# Patient Record
Sex: Female | Born: 1945
Health system: Southern US, Community
[De-identification: ages and names within clinical notes are randomized; demographics above are authoritative.]

## PROBLEM LIST (undated history)

## (undated) DIAGNOSIS — K219 Gastro-esophageal reflux disease without esophagitis: Secondary | ICD-10-CM

## (undated) DIAGNOSIS — E7800 Pure hypercholesterolemia, unspecified: Secondary | ICD-10-CM

## (undated) DIAGNOSIS — E119 Type 2 diabetes mellitus without complications: Secondary | ICD-10-CM

## (undated) DIAGNOSIS — F419 Anxiety disorder, unspecified: Secondary | ICD-10-CM

## (undated) DIAGNOSIS — R1310 Dysphagia, unspecified: Secondary | ICD-10-CM

## (undated) DIAGNOSIS — K449 Diaphragmatic hernia without obstruction or gangrene: Secondary | ICD-10-CM

## (undated) DIAGNOSIS — R202 Paresthesia of skin: Secondary | ICD-10-CM

## (undated) DIAGNOSIS — J45909 Unspecified asthma, uncomplicated: Secondary | ICD-10-CM

## (undated) DIAGNOSIS — I10 Essential (primary) hypertension: Secondary | ICD-10-CM

## (undated) DIAGNOSIS — Z8489 Family history of other specified conditions: Secondary | ICD-10-CM

## (undated) DIAGNOSIS — J309 Allergic rhinitis, unspecified: Secondary | ICD-10-CM

## (undated) HISTORY — DX: Essential (primary) hypertension: I10

## (undated) HISTORY — DX: Allergic rhinitis, unspecified: J30.9

## (undated) HISTORY — PX: LASIK: SHX215

## (undated) HISTORY — DX: Paresthesia of skin: R20.2

## (undated) HISTORY — DX: Type 2 diabetes mellitus without complications: E11.9

## (undated) HISTORY — DX: Pure hypercholesterolemia, unspecified: E78.00

## (undated) HISTORY — DX: Gastro-esophageal reflux disease without esophagitis: K21.9

## (undated) HISTORY — DX: Unspecified asthma, uncomplicated: J45.909

## (undated) HISTORY — PX: LIPOMA RESECTION: SHX23

## (undated) HISTORY — DX: Diaphragmatic hernia without obstruction or gangrene: K44.9

## (undated) HISTORY — DX: Dysphagia, unspecified: R13.10

## (undated) MED FILL — Iron Sucrose Inj 20 MG/ML (Fe Equiv): INTRAVENOUS | Qty: 10 | Status: AC

---

## 2005-10-07 ENCOUNTER — Emergency Department (HOSPITAL_COMMUNITY): Admission: EM | Admit: 2005-10-07 | Discharge: 2005-10-07 | Payer: Self-pay | Admitting: Emergency Medicine

## 2006-06-20 HISTORY — PX: UPPER GASTROINTESTINAL ENDOSCOPY: SHX188

## 2006-09-04 ENCOUNTER — Encounter: Admission: RE | Admit: 2006-09-04 | Discharge: 2006-09-04 | Payer: Self-pay | Admitting: Otolaryngology

## 2006-09-26 ENCOUNTER — Ambulatory Visit: Payer: Self-pay | Admitting: Internal Medicine

## 2006-09-28 ENCOUNTER — Ambulatory Visit: Payer: Self-pay | Admitting: Internal Medicine

## 2006-11-29 ENCOUNTER — Ambulatory Visit: Payer: Self-pay | Admitting: Internal Medicine

## 2007-11-03 DIAGNOSIS — I10 Essential (primary) hypertension: Secondary | ICD-10-CM | POA: Insufficient documentation

## 2007-11-03 DIAGNOSIS — K219 Gastro-esophageal reflux disease without esophagitis: Secondary | ICD-10-CM | POA: Insufficient documentation

## 2007-11-03 DIAGNOSIS — J309 Allergic rhinitis, unspecified: Secondary | ICD-10-CM | POA: Insufficient documentation

## 2007-11-03 DIAGNOSIS — E119 Type 2 diabetes mellitus without complications: Secondary | ICD-10-CM | POA: Insufficient documentation

## 2008-04-01 ENCOUNTER — Telehealth: Payer: Self-pay | Admitting: Internal Medicine

## 2010-11-02 NOTE — Assessment & Plan Note (Signed)
[facility]                         GASTROENTEROLOGY OFFICE NOTE   NAME:Holly Harper, Holly Harper                       MRN:          [id number]  DATE:11/29/2006                            DOB:          March 23, 1946    CHIEF COMPLAINT:  Followup of cough, atypical reflux.   She is significantly improved with much less cough on Protonix (generic)  40 mg b.i.d.  She feels well overall.  I have reviewed her medications,  reviewed the endoscopy findings that showed minimal erythema in the  distal esophagus.  She has had some prior authorization issues that may  have been straightened out with her Protonix.   PROBLEMS/PAST MEDICAL HISTORY:  See list from September 26, 2006 plus chronic  cough thought to be secondary to GERD.   PHYSICAL EXAMINATION:  VITAL SIGNS:  Height 5 feet 3.  Weight 155  pounds.  Pulse 68.  Blood pressure 100/68.   ASSESSMENT:  Atypical reflux with proximal reflux problems causing cough  as the working diagnosis.  She has responded to b.i.d. proton pump  inhibitor.   PLAN:  Continue b.i.d. PPI.  I want her to have good control of her  cough for about two months, and then she can try to go to once a day.  I  have refilled her Protonix (generic), and we will try to get the prior  authorization that is needed to continue this prescription.  She will  see me back as needed otherwise.  She will check into exactly when her  last colonoscopy was, as I have told her she should have one every 10  years, as she is average risk and apparently had no polyps the last  time.     Iva Boop, MD,FACG  Electronically Signed    CEG/MedQ  DD: 11/29/2006  DT: 11/30/2006  Job #: [id number]   cc:   Hermelinda Medicus, M.D.  Laureen Abrahams, M.D.

## 2010-11-05 NOTE — Assessment & Plan Note (Signed)
[facility]                         GASTROENTEROLOGY OFFICE NOTE   NAME:Holly Harper, Holly Harper                       MRN:          [id number]  DATE:09/28/2006                            DOB:          [birth year]    I spoke to Mr. Nordmann, regarding his wife. He asked me to call him  during the routine followup call from the [facility]. He  wanted to let me know that he thinks that his wife goes for a day or two  without coughing and that when she does do it she is really clearing her  throat. She has a habit of not blowing her nose and snorting congestion  instead. He thinks this may be the problem and not really a cough. He  thinks it may be a nervous habit that she has developed. I told him I  would keep that in consideration when she comes in for followup.     Iva Boop, MD,FACG  Electronically Signed    CEG/MedQ  DD: 09/29/2006  DT: 09/29/2006  Job #: [id number]

## 2010-11-05 NOTE — Assessment & Plan Note (Signed)
HEALTHCARE                         GASTROENTEROLOGY OFFICE NOTE   NAME:Mapp, Mccall                       MRN:          [id number]  DATE:09/26/2006                            DOB:          [birth year]    CHIEF COMPLAINT:  Cough.   REQUESTING PHYSICIAN:  Dr. Hermelinda Medicus.   ASSESSMENT:  A 65 year old white woman with cough for a year.  It is a  nonproductive cough.  She has seen a pulmonary physician, as well as Dr.  Haroldine Laws and her primary care physician.  It sounds like a trial of  single-dose Nexium was ineffective, though there were side effects.  She  does not appear to have any evidence of laryngeal damage from Dr.  Allayne Stack information sent.  She does not have dysphagia.  She had not  responded to trials of prednisone in the past, and apparently does not  have asthma or COPD per her history.   Chronic reflux is a possibility.  She had a barium swallow which Dr.  Haroldine Laws tells me showed reflux, tertiary contractions, and a hiatal  hernia.   PLAN:  1. Upper GI endoscopy to look for mucosal damage, check the anatomy,      measure the Z-line.  2. Consider a trial of a different PPI b.i.d. for 2 to 3 months.  She      had gas symptoms with the Nexium.  3. Review records from Dr. Egbert Garibaldi and Dr. Blenda Nicely.  These will be      requested.   HISTORY:  This is a 65 year old white woman with problems as outlined  above.  She feels like phlegm needs to come up, but she cannot get it  up.  There may be some sinus drainage at times.  She has been to Dr.  Haroldine Laws, her primary care physician, and a pulmonary physician, Dr.  Blenda Nicely.  No conclusive diagnosis has been given, and she has the  persistent symptoms.  She took Nexium for an unspecified period of time  on single dose, but could not tolerate the gas and is now on Zantac.  There are symptoms of heartburn and reflux, and the barium swallow  findings are as noted.  There does not seem to be  a predominance of post-  nasal drip or sinus drainage that I can see.  She eliminated caffeine,  but still has problems.  The cough can occur at any times.  It had  frequently occurred after eating, but in general, had occurred at other  times as well.   GI review of systems is otherwise negative, or as stated in the  assessment.   MEDICATIONS:  Listed and reviewed in the chart.  See that for doses.  She is on atenolol, glipizide, hydrochlorothiazide, Lipitor, [facility], Kalcinate plus, Echinacea, Co-Q-10, Vi-Q-Tuss cough medicine  p.r.n.   MACRODANTIN is an allergy.   Other p.r.n. medications are Astelin nasal spray.   PAST MEDICAL HISTORY:  1. Hypertension.  2. Mild allergies.  3. Diabetes mellitus.  4. Eye surgery.  5. Colonoscopy negative 5 years or so ago [facility]).   FAMILY  HISTORY:  Positive for prostate cancer and heart disease in her  father.  No colon cancer.   SOCIAL HISTORY:  The patient is married.  She is an Database administrator.  No alcohol, tobacco, or drugs used.   REVIEW OF SYSTEMS:  See my medical history form for full details.  There  is some dyspnea, but she says she relates that mainly to lack of  activity and deconditioning.   PHYSICAL EXAM:  Reveals a well-developed, well-nourished middle-aged  woman.  Height 5 feet 3 inches, weight 153 pounds, blood pressure 110/70, pulse  62.  She is overweight.  EYES:  Anicteric.  HENT:  Normal mouth and posterior pharynx.  NECK:  Supple.  No thyromegaly or mass.  CHEST:  Clear.  HEART:  S1, S2.  No rubs or gallops.  ABDOMEN:  Soft and nontender without organomegaly or mass.  LYMPHATICS:  No neck or supraclavicular nodes.  EXTREMITIES:  No peripheral edema.  SKIN:  Warm and dry.  No acute rash in areas inspected.  NEURO:  She is alert and oriented x3.   I have reviewed Dr. Allayne Stack office note and his referral letter.   Note, a modified barium swallow could be indicated to check for silent   aspiration problems.  I think we will proceed with the workup as  described first.  I have also reviewed the barium swallow report, as  well as the 2-view chest x-ray report ordered by Dr. Haroldine Laws and  performed at [facility].  I appreciate the opportunity to care  for this patient.     Iva Boop, MD,FACG  Electronically Signed    CEG/MedQ  DD: 09/26/2006  DT: 09/26/2006  Job #: [id number]   cc:   Hermelinda Medicus, M.D.  John Slatosky  Tanvir Delynn Flavin, MD

## 2011-08-24 HISTORY — PX: ESOPHAGOGASTRODUODENOSCOPY: SHX1529

## 2012-12-13 ENCOUNTER — Encounter: Payer: Self-pay | Admitting: Internal Medicine

## 2013-07-17 ENCOUNTER — Encounter: Payer: Self-pay | Admitting: Internal Medicine

## 2015-04-24 ENCOUNTER — Other Ambulatory Visit: Payer: Self-pay | Admitting: *Deleted

## 2015-04-24 MED ORDER — OMEPRAZOLE 40 MG PO CPDR: 40.0000 mg | DELAYED_RELEASE_CAPSULE | Freq: Every day | ORAL | Status: DC

## 2015-06-28 ENCOUNTER — Other Ambulatory Visit: Payer: Self-pay | Admitting: Allergy and Immunology

## 2015-07-09 ENCOUNTER — Encounter: Payer: Self-pay | Admitting: Allergy and Immunology

## 2015-07-09 ENCOUNTER — Ambulatory Visit (INDEPENDENT_AMBULATORY_CARE_PROVIDER_SITE_OTHER): Payer: Medicare Other | Admitting: Allergy and Immunology

## 2015-07-09 VITALS — BP 110/60 | HR 66 | Resp 14 | Ht 63.27 in | Wt 130.1 lb

## 2015-07-09 DIAGNOSIS — J4520 Mild intermittent asthma, uncomplicated: Secondary | ICD-10-CM

## 2015-07-09 DIAGNOSIS — K219 Gastro-esophageal reflux disease without esophagitis: Secondary | ICD-10-CM

## 2015-07-09 DIAGNOSIS — J309 Allergic rhinitis, unspecified: Secondary | ICD-10-CM | POA: Diagnosis not present

## 2015-07-09 DIAGNOSIS — J387 Other diseases of larynx: Secondary | ICD-10-CM | POA: Diagnosis not present

## 2015-07-09 DIAGNOSIS — H1010 Acute atopic conjunctivitis, unspecified eye: Secondary | ICD-10-CM | POA: Diagnosis not present

## 2015-07-09 NOTE — Progress Notes (Signed)
[facility]  Follow-up Note  Referring Provider: No ref. provider found Primary Provider: Enid Skeens., MD Date of Office Visit: 07/09/2015  Subjective:   Holly Harper is a 70 y.o. female who returns to the [facility] in re-evaluation of the following:  HPI Comments: Arlette returns to this clinic on 19 generally 2017 in evaluation of her asthma, allergic rhinoconjunctivitis, and LPR. She's done quite well over the course the past year. Her nose is not been causing her any problem. Her chest is not been causing her any problem. She rarely uses her bronchodilator less than 1 time per week and has not had an exacerbation of her asthma requiring her to get a systemic steroid. Her reflux is been under excellent control. She did receive the flu vaccine this year and the zoster vaccine vaccine.   Current Outpatient Prescriptions on File Prior to Visit  Medication Sig Dispense Refill  . montelukast (SINGULAIR) 10 MG tablet TAKE 1 TABLET BY MOUTH EVERY DAY AS DIRECTED 30 tablet 0  . omeprazole (PRILOSEC) 40 MG capsule Take 1 capsule (40 mg total) by mouth daily. 30 capsule 1   No current facility-administered medications on file prior to visit.    No orders of the defined types were placed in this encounter.    Past Medical History  Diagnosis Date  . GERD (gastroesophageal reflux disease)   . Hiatal hernia   . Asthma   . Allergic rhinitis   . Diabetes ([place])   . Hypertension   . Hypercholesterolemia     Past Surgical History  Procedure Laterality Date  . Lasik Bilateral     Allergies  Allergen Reactions  . Amoxicillin Hives  . Cinobac [Cinoxacin]   . Macrodantin [Nitrofurantoin Macrocrystal] Hives    Review of systems negative except as noted in HPI / PMHx or noted below:  Review of Systems  Constitutional: Negative.   HENT: Negative.   Eyes: Negative.   Respiratory: Negative.   Cardiovascular:  Negative.   Gastrointestinal: Negative.   Genitourinary: Negative.   Musculoskeletal: Negative.   Skin: Negative.   Neurological: Negative.   Endo/Heme/Allergies: Negative.   Psychiatric/Behavioral: Negative.      Objective:   Filed Vitals:   07/09/15 1139  BP: 110/60  Pulse: 66  Resp: 14   Height: 5' 3.27" (160.7 cm)  Weight: 130 lb 1.1 oz (59 kg)   Physical Exam  Constitutional: She is well-developed, well-nourished, and in no distress. No distress.  HENT:  Head: Normocephalic.  Right Ear: Tympanic membrane, external ear and ear canal normal.  Left Ear: Tympanic membrane, external ear and ear canal normal.  Nose: Nose normal. No mucosal edema or rhinorrhea.  Mouth/Throat: Uvula is midline, oropharynx is clear and moist and mucous membranes are normal. No oropharyngeal exudate.  Eyes: Conjunctivae are normal.  Neck: Trachea normal. No tracheal tenderness present. No tracheal deviation present. No thyromegaly present.  Cardiovascular: Normal rate, regular rhythm, S1 normal, S2 normal and normal heart sounds.   No murmur heard. Pulmonary/Chest: Breath sounds normal. No stridor. No respiratory distress. She has no wheezes. She has no rales.  Musculoskeletal: She exhibits no edema.  Lymphadenopathy:       Head (right side): No tonsillar adenopathy present.       Head (left side): No tonsillar adenopathy present.    She has no cervical adenopathy.    She has no axillary adenopathy.  Neurological: She is alert. Gait normal.  Skin: No rash noted. She is not diaphoretic. No erythema. Nails show no clubbing.  Psychiatric: Mood and affect normal.    Diagnostics:    Spirometry was performed and demonstrated an FEV1 of 1.95 at 89 % of predicted.  The patient had an Asthma Control Test with the following results:  .    Assessment and Plan:   1. Asthma, mild intermittent, well-controlled   2. LPRD (laryngopharyngeal reflux disease)   3. Allergic rhinoconjunctivitis      1. Continue montelukast 10 mg daily  2. Continue omeprazole 40 mg daily  3. Continue nasal azelastine 2 sprays each nostril twice a day if needed  4. Continue ProAir HFA 2 puffs every 4-6 hours if needed  5. Return to clinic in 1 year or earlier if problem  Lynnsey will return to this clinic in approximately one year while continuing to use therapy directed against inflammation of her respiratory tract and reflux. Certainly if she has problems during the interval she will return to see me for further evaluation and treatment but overall she has had an excellent response to menstruation montelukast and omeprazole as her medications designed to manage her asthma and allergic rhinoconjunctivitis and reflux.  [name], MD [place]

## 2015-07-09 NOTE — Patient Instructions (Signed)
  1. Continue montelukast 10 mg daily  2. Continue omeprazole 40 mg daily  3. Continue nasal azelastine 2 sprays each nostril twice a day if needed  4. Continue ProAir HFA 2 puffs every 4-6 hours if needed  5. Return to clinic in 1 year or earlier if problem

## 2015-07-18 ENCOUNTER — Other Ambulatory Visit: Payer: Self-pay | Admitting: Allergy and Immunology

## 2015-07-29 ENCOUNTER — Other Ambulatory Visit: Payer: Self-pay | Admitting: Allergy and Immunology

## 2015-08-15 ENCOUNTER — Other Ambulatory Visit: Payer: Self-pay | Admitting: Allergy and Immunology

## 2015-11-24 ENCOUNTER — Other Ambulatory Visit: Payer: Self-pay | Admitting: Allergy and Immunology

## 2016-01-18 ENCOUNTER — Other Ambulatory Visit: Payer: Self-pay | Admitting: Allergy and Immunology

## 2016-02-10 ENCOUNTER — Other Ambulatory Visit: Payer: Self-pay

## 2016-02-10 ENCOUNTER — Telehealth: Payer: Self-pay | Admitting: Allergy and Immunology

## 2016-02-10 HISTORY — PX: COLONOSCOPY: SHX174

## 2016-02-10 MED ORDER — MOMETASONE FUROATE 220 MCG/INH IN AEPB: 2.0000 | INHALATION_SPRAY | Freq: Two times a day (BID) | RESPIRATORY_TRACT | 0 refills | Status: DC

## 2016-02-10 NOTE — Telephone Encounter (Signed)
Had a colonoscopy today and her oxygen is low. She wants to add asmanex to her plan. She said she was on it before.    [facility]

## 2016-02-10 NOTE — Telephone Encounter (Signed)
Prescribed Asmanex 220 - 2 inhalations twice a day. Please have patient come to see me in clinic

## 2016-02-10 NOTE — Telephone Encounter (Signed)
Pt informed. Asmanex sent to pharmacy and pt will schedule appt

## 2016-02-17 ENCOUNTER — Ambulatory Visit (INDEPENDENT_AMBULATORY_CARE_PROVIDER_SITE_OTHER): Payer: Medicare Other | Admitting: Allergy and Immunology

## 2016-02-17 ENCOUNTER — Encounter: Payer: Self-pay | Admitting: Allergy and Immunology

## 2016-02-17 VITALS — BP 140/74 | HR 72 | Resp 20

## 2016-02-17 DIAGNOSIS — J387 Other diseases of larynx: Secondary | ICD-10-CM

## 2016-02-17 DIAGNOSIS — J4531 Mild persistent asthma with (acute) exacerbation: Secondary | ICD-10-CM | POA: Diagnosis not present

## 2016-02-17 DIAGNOSIS — J309 Allergic rhinitis, unspecified: Secondary | ICD-10-CM | POA: Diagnosis not present

## 2016-02-17 DIAGNOSIS — H1010 Acute atopic conjunctivitis, unspecified eye: Secondary | ICD-10-CM | POA: Diagnosis not present

## 2016-02-17 DIAGNOSIS — K219 Gastro-esophageal reflux disease without esophagitis: Secondary | ICD-10-CM

## 2016-02-17 MED ORDER — RANITIDINE HCL 300 MG PO TABS: ORAL_TABLET | ORAL | 5 refills | Status: DC

## 2016-02-17 MED ORDER — METHYLPREDNISOLONE ACETATE 80 MG/ML IJ SUSP
80.0000 mg | Freq: Once | INTRAMUSCULAR | Status: AC
  Administered 2016-02-17: 80 mg via INTRAMUSCULAR

## 2016-02-17 NOTE — Progress Notes (Signed)
Follow-up Note  Referring Provider: Enid Skeens., MD Primary Provider: Enid Skeens., MD Date of Office Visit: 02/17/2016  Subjective:   Holly Harper (DOB: Jul 01, 1945) is a 70 y.o. female who returns to the [facility] on 02/17/2016 in re-evaluation of the following:  HPI: Ave presents to this clinic in evaluation of her asthma and allergic rhinoconjunctivitis and LPR. I've not seen her in his clinic since January 2017.  She was doing quite well until about 2 weeks ago. At that point time she developed some cough and some shortness of breath and raspy voice and she's been using her short-acting bronchodilator which does help her transiently. Apparently last week when she was having a colonoscopy she tells me that her oxygen level may have been low. In addition she's had some stuffy nose and she restarted her Astelin. She contacted [place] by telephone about 5 days ago and asked to restart her Asmanex.    Medication List      atenolol 100 MG tablet Commonly known as:  TENORMIN Take 50 mg by mouth daily.   atorvastatin 10 MG tablet Commonly known as:  LIPITOR TAKE 1 TABLET ONCE A DAY (AT BEDTIME)   azelastine 0.1 % nasal spray Commonly known as:  ASTELIN Place 2 sprays into both nostrils 2 (two) times daily.   FLUZONE HIGH-DOSE 0.5 ML Susy Generic drug:  Influenza Vac Split High-Dose TO BE ADMINISTERED BY A PHARMACIST   glipiZIDE 10 MG 24 hr tablet Commonly known as:  GLUCOTROL XL Take 10 mg by mouth daily.   hydrochlorothiazide 25 MG tablet Commonly known as:  HYDRODIURIL Take 25 mg by mouth daily.   JANUVIA 100 MG tablet Generic drug:  sitaGLIPtin   metFORMIN 500 MG tablet Commonly known as:  GLUCOPHAGE 1,000 mg 2 (two) times daily.   mometasone 220 MCG/INH inhaler Commonly known as:  ASMANEX 60 METERED DOSES Inhale 2 puffs into the lungs 2 (two) times daily.   montelukast 10 MG tablet Commonly known as:  SINGULAIR TAKE 1 TABLET BY  MOUTH EVERY DAY AS DIRECTED   MUCINEX DM PO Take by mouth as needed.   omeprazole 40 MG capsule Commonly known as:  PRILOSEC TAKE 1 CAPSULE (40 MG TOTAL) BY MOUTH DAILY.   ONE TOUCH ULTRA TEST test strip Generic drug:  glucose blood USE AS DIRECTED TEST DAILY   PROAIR HFA 108 (90 Base) MCG/ACT inhaler Generic drug:  albuterol INHALE 2 PUFFS EVERY 4-6 HOURS AS NEEDED FOR COUGH OR WHEEZE   VITAMIN D PO Take by mouth.       Past Medical History:  Diagnosis Date  . Allergic rhinitis   . Asthma   . Diabetes ([place])   . GERD (gastroesophageal reflux disease)   . Hiatal hernia   . Hypercholesterolemia   . Hypertension     Past Surgical History:  Procedure Laterality Date  . LASIK Bilateral     Allergies  Allergen Reactions  . Amoxicillin Hives  . Cinobac [Cinoxacin]   . Macrodantin [Nitrofurantoin Macrocrystal] Hives    Review of systems negative except as noted in HPI / PMHx or noted below:  Review of Systems  Constitutional: Negative.   HENT: Negative.   Eyes: Negative.   Respiratory: Negative.   Cardiovascular: Negative.   Gastrointestinal: Negative.   Genitourinary: Negative.   Musculoskeletal: Negative.   Skin: Negative.   Neurological: Negative.   Endo/Heme/Allergies: Negative.   Psychiatric/Behavioral: Negative.      Objective:   Vitals:  02/17/16 1450  BP: 140/74  Pulse: 72  Resp: 20          Physical Exam  Constitutional: She is well-developed, well-nourished, and in no distress.  Raspy voice  HENT:  Head: Normocephalic.  Right Ear: Tympanic membrane, external ear and ear canal normal.  Left Ear: Tympanic membrane, external ear and ear canal normal.  Nose: Nose normal. No mucosal edema or rhinorrhea.  Mouth/Throat: Uvula is midline, oropharynx is clear and moist and mucous membranes are normal. No oropharyngeal exudate.  Eyes: Conjunctivae are normal.  Neck: Trachea normal. No tracheal tenderness present. No tracheal deviation  present. No thyromegaly present.  Cardiovascular: Normal rate, regular rhythm, S1 normal, S2 normal and normal heart sounds.   No murmur heard. Pulmonary/Chest: Breath sounds normal. No stridor. No respiratory distress. She has no wheezes. She has no rales.  Musculoskeletal: She exhibits no edema.  Lymphadenopathy:       Head (right side): No tonsillar adenopathy present.       Head (left side): No tonsillar adenopathy present.    She has no cervical adenopathy.  Neurological: She is alert. Gait normal.  Skin: No rash noted. She is not diaphoretic. No erythema. Nails show no clubbing.  Psychiatric: Mood and affect normal.    Diagnostics:    Spirometry was performed and demonstrated an FEV1 of 1.78 at 82 % of predicted.  Oxygen saturation was 96% on room air at rest   Assessment and Plan:   1. Asthma, not well controlled, mild persistent, with acute exacerbation   2. LPRD (laryngopharyngeal reflux disease)   3. Allergic rhinoconjunctivitis     1. Continue montelukast 10 mg daily  2. Continue Asmanex 220 - 2 inhalations twice a day  3. Continue omeprazole 40 mg daily and add ranitidine 300 mg in evening  4. Depo-Medrol 80 IM delivered in clinic today  5. Continue nasal azelastine 2 sprays each nostril twice a day if needed  6. Continue ProAir HFA 2 puffs every 4-6 hours if needed  7. Return to clinic in 8 weeks or earlier if problem  8. Obtain fall flu vaccine  Kal[place]ia will use a combination of anti-inflammatory medications for her respiratory tract including a systemic steroid and we'll get her to be a little bit more aggressive about treating her reflux-induced respiratory disease with the addition of an H2 receptor blocker over the course of the next 8 weeks or so. She'll contact me should she have significant problems in the face of that therapy but otherwise I'll regroup with her in 8 weeks and we will make a decision about how to proceed pending her response.  [name], MD [place]

## 2016-02-17 NOTE — Patient Instructions (Addendum)
  1. Continue montelukast 10 mg daily  2. Continue Asmanex 220 - 2 inhalations twice a day  3. Continue omeprazole 40 mg daily and add ranitidine 300 mg in evening  4. Depo-Medrol 80 IM delivered in clinic today  5. Continue nasal azelastine 2 sprays each nostril twice a day if needed  6. Continue ProAir HFA 2 puffs every 4-6 hours if needed  7. Return to clinic in 8 weeks or earlier if problem  8. Obtain fall flu vaccine

## 2016-03-04 ENCOUNTER — Other Ambulatory Visit: Payer: Self-pay | Admitting: Allergy and Immunology

## 2016-04-13 ENCOUNTER — Ambulatory Visit (INDEPENDENT_AMBULATORY_CARE_PROVIDER_SITE_OTHER): Payer: Medicare Other | Admitting: Allergy and Immunology

## 2016-04-13 ENCOUNTER — Encounter: Payer: Self-pay | Admitting: Allergy and Immunology

## 2016-04-13 VITALS — BP 132/78 | HR 72 | Resp 16

## 2016-04-13 DIAGNOSIS — K219 Gastro-esophageal reflux disease without esophagitis: Secondary | ICD-10-CM

## 2016-04-13 DIAGNOSIS — J4520 Mild intermittent asthma, uncomplicated: Secondary | ICD-10-CM

## 2016-04-13 DIAGNOSIS — J3089 Other allergic rhinitis: Secondary | ICD-10-CM | POA: Diagnosis not present

## 2016-04-13 NOTE — Patient Instructions (Addendum)
  1. Continue montelukast 10 mg daily  2. Continue "action plan" for asthma flare including Asmanex 220 - 2 inhalations twice a day  3. Continue omeprazole 40 mg daily and add ranitidine 300 mg in evening if needed  4. Continue nasal azelastine 2 sprays each nostril twice a day if needed  5. Continue ProAir HFA 2 puffs every 4-6 hours if needed  6. Return to clinic in 6 months or earlier if problem

## 2016-04-13 NOTE — Progress Notes (Signed)
Follow-up Note  Referring Provider: Enid Harper., MD Primary Provider: Enid Harper., MD Date of Office Visit: 04/13/2016  Subjective:   Holly Harper (DOB: 1945/10/23) is a 70 y.o. female who returns to the [facility] on 04/13/2016 in re-evaluation of the following:  HPI: Holly Harper returns to this clinic in reevaluation of her intermittent asthma and allergic rhinoconjunctivitis and LPR. I last saw her in this clinic in August 2017 at which time she appeared to have a respiratory tract flare that required the administration of systemic steroids.  She has really done quite well since that event and once again tapered off her Asmanex. She only uses her Asmanex as part of an action plan. She does not use any short acting bronchodilator and she can exert herself without any problem.  Her nose has not been causing her any issue. She has not required an antibiotic to treat an episode of sinusitis.  Her reflux is under very good control and she no longer uses ranitidine at night time but does rely on the use of omeprazole every day.  She has received her flu vaccine this year.    Medication List      ASMANEX 60 METERED DOSES 220 MCG/INH inhaler Generic drug:  mometasone INHALE 2 PUFFS INTO THE LUNGS 2 (TWO) TIMES DAILY.   atenolol 100 MG tablet Commonly known as:  TENORMIN Take 50 mg by mouth daily.   atorvastatin 10 MG tablet Commonly known as:  LIPITOR TAKE 1 TABLET ONCE A DAY (AT BEDTIME)   azelastine 0.1 % nasal spray Commonly known as:  ASTELIN Place 2 sprays into both nostrils 2 (two) times daily.   glipiZIDE 10 MG 24 hr tablet Commonly known as:  GLUCOTROL XL Take 10 mg by mouth daily.   hydrochlorothiazide 25 MG tablet Commonly known as:  HYDRODIURIL Take 25 mg by mouth daily.   JANUVIA 100 MG tablet Generic drug:  sitaGLIPtin   metFORMIN 500 MG tablet Commonly known as:  GLUCOPHAGE 1,000 mg 2 (two) times daily.   montelukast 10 MG  tablet Commonly known as:  SINGULAIR TAKE 1 TABLET BY MOUTH EVERY DAY AS DIRECTED   MUCINEX DM PO Take by mouth as needed.   omeprazole 40 MG capsule Commonly known as:  PRILOSEC TAKE 1 CAPSULE (40 MG TOTAL) BY MOUTH DAILY.   ONE TOUCH ULTRA TEST test strip Generic drug:  glucose blood USE AS DIRECTED TEST DAILY   PROAIR HFA 108 (90 Base) MCG/ACT inhaler Generic drug:  albuterol INHALE 2 PUFFS EVERY 4-6 HOURS AS NEEDED FOR COUGH OR WHEEZE   ranitidine 300 MG tablet Commonly known as:  ZANTAC Take one tablet every evening       Past Medical History:  Diagnosis Date  . Allergic rhinitis   . Asthma   . Diabetes ([place])   . GERD (gastroesophageal reflux disease)   . Hiatal hernia   . Hypercholesterolemia   . Hypertension     Past Surgical History:  Procedure Laterality Date  . LASIK Bilateral     Allergies  Allergen Reactions  . Amoxicillin Hives  . Cinobac [Cinoxacin]   . Macrodantin [Nitrofurantoin Macrocrystal] Hives    Review of systems negative except as noted in HPI / PMHx or noted below:  Review of Systems  Constitutional: Negative.   HENT: Negative.   Eyes: Negative.   Respiratory: Negative.   Cardiovascular: Negative.   Gastrointestinal: Negative.   Genitourinary: Negative.   Musculoskeletal: Negative.   Skin: Negative.  Neurological: Negative.   Endo/Heme/Allergies: Negative.   Psychiatric/Behavioral: Negative.      Objective:   Vitals:   04/13/16 1436  BP: 132/78  Pulse: 72  Resp: 16          Physical Exam  Constitutional: She is well-developed, well-nourished, and in no distress.  HENT:  Head: Normocephalic.  Right Ear: Tympanic membrane, external ear and ear canal normal.  Left Ear: Tympanic membrane, external ear and ear canal normal.  Nose: Nose normal. No mucosal edema or rhinorrhea.  Mouth/Throat: Uvula is midline, oropharynx is clear and moist and mucous membranes are normal. No oropharyngeal exudate.  Eyes:  Conjunctivae are normal.  Neck: Trachea normal. No tracheal tenderness present. No tracheal deviation present. No thyromegaly present.  Cardiovascular: Normal rate, regular rhythm, S1 normal, S2 normal and normal heart sounds.   No murmur heard. Pulmonary/Chest: Breath sounds normal. No stridor. No respiratory distress. She has no wheezes. She has no rales.  Musculoskeletal: She exhibits no edema.  Lymphadenopathy:       Head (right side): No tonsillar adenopathy present.       Head (left side): No tonsillar adenopathy present.    She has no cervical adenopathy.  Neurological: She is alert. Gait normal.  Skin: No rash noted. She is not diaphoretic. No erythema. Nails show no clubbing.  Psychiatric: Mood and affect normal.    Diagnostics:    Spirometry was performed and demonstrated an FEV1 of 1.83 at 84 % of predicted.  The patient had an Asthma Control Test with the following results: ACT Total Score: 24.    Assessment and Plan:   1. Asthma, mild intermittent, well-controlled   2. Other allergic rhinitis   3. LPRD (laryngopharyngeal reflux disease)     1. Continue montelukast 10 mg daily  2. Continue "action plan" for asthma flare including Asmanex 220 - 2 inhalations twice a day  3. Continue omeprazole 40 mg daily and add ranitidine 300 mg in evening if needed  4. Continue nasal azelastine 2 sprays each nostril twice a day if needed  5. Continue ProAir HFA 2 puffs every 4-6 hours if needed  6. Return to clinic in 6 months or earlier if problem  Holly Harper is really doing very well on her current plan and I see no need for changing her anti-inflammatory medications for her respiratory tract or her proton pump inhibitor for her reflux at this point in time. Certainly she can activate her action plan should she develop an asthma flare in the future. I will see her back in this clinic in 6 months or earlier if there is a problem.  Holly Katz, MD [facility]

## 2016-06-21 DIAGNOSIS — E119 Type 2 diabetes mellitus without complications: Secondary | ICD-10-CM | POA: Insufficient documentation

## 2016-06-25 ENCOUNTER — Other Ambulatory Visit: Payer: Self-pay | Admitting: Allergy and Immunology

## 2016-07-11 ENCOUNTER — Ambulatory Visit: Payer: Medicare Other | Admitting: Allergy and Immunology

## 2016-08-04 ENCOUNTER — Other Ambulatory Visit: Payer: Self-pay | Admitting: Allergy and Immunology

## 2016-10-06 ENCOUNTER — Other Ambulatory Visit: Payer: Self-pay | Admitting: *Deleted

## 2016-10-06 MED ORDER — OMEPRAZOLE 40 MG PO CPDR: DELAYED_RELEASE_CAPSULE | ORAL | 0 refills | Status: DC

## 2016-10-13 ENCOUNTER — Encounter: Payer: Self-pay | Admitting: Allergy and Immunology

## 2016-10-13 ENCOUNTER — Ambulatory Visit (INDEPENDENT_AMBULATORY_CARE_PROVIDER_SITE_OTHER): Payer: Medicare Other | Admitting: Allergy and Immunology

## 2016-10-13 VITALS — BP 120/66 | HR 72 | Resp 20

## 2016-10-13 DIAGNOSIS — J0100 Acute maxillary sinusitis, unspecified: Secondary | ICD-10-CM | POA: Diagnosis not present

## 2016-10-13 DIAGNOSIS — J3089 Other allergic rhinitis: Secondary | ICD-10-CM | POA: Diagnosis not present

## 2016-10-13 DIAGNOSIS — J4531 Mild persistent asthma with (acute) exacerbation: Secondary | ICD-10-CM | POA: Diagnosis not present

## 2016-10-13 DIAGNOSIS — K219 Gastro-esophageal reflux disease without esophagitis: Secondary | ICD-10-CM

## 2016-10-13 MED ORDER — CEFUROXIME AXETIL 250 MG PO TABS: 250.0000 mg | ORAL_TABLET | Freq: Two times a day (BID) | ORAL | 0 refills | Status: AC

## 2016-10-13 MED ORDER — RANITIDINE HCL 300 MG PO TABS: 300.0000 mg | ORAL_TABLET | Freq: Every day | ORAL | 5 refills | Status: DC

## 2016-10-13 MED ORDER — METHYLPREDNISOLONE ACETATE 80 MG/ML IJ SUSP
80.0000 mg | Freq: Once | INTRAMUSCULAR | Status: AC
  Administered 2016-10-13: 80 mg via INTRAMUSCULAR

## 2016-10-13 MED ORDER — FLUTICASONE FUROATE 200 MCG/ACT IN AEPB: 1.0000 | INHALATION_SPRAY | Freq: Every day | RESPIRATORY_TRACT | 3 refills | Status: DC

## 2016-10-13 NOTE — Progress Notes (Signed)
Follow-up Note  Referring Provider: Enid Skeens., MD Primary Provider: Enid Skeens., MD Date of Office Visit: 10/13/2016  Subjective:   Holly Harper (DOB: 1946-02-26) is a 71 y.o. female who returns to the [facility] on 10/13/2016 in re-evaluation of the following:  HPI:  Holly Harper resents to this clinic in evaluation of her asthma and allergic rhinoconjunctivitis and LPR. I last saw her in this clinic October 2017 at which point time she was doing relatively well.  She continued to do well up until about one month ago. At that point time she developed nasal congestion and sinus ache and headache and postnasal drip and throat clearing and had cough. She has also been having some issues with increased regurgitation and reflux. She continues on montelukast and omeprazole on a regular basis and has occasionally added ranitidine and also continues to use nasal antihistamine. Her requirement for short-acting bronchodilator during this timeframe is 3 times per week.  Allergies as of 10/13/2016      Reactions   Amoxicillin Hives   Cinobac [cinoxacin]    Macrodantin [nitrofurantoin Macrocrystal] Hives      Medication List      ASMANEX 60 METERED DOSES 220 MCG/INH inhaler Generic drug:  mometasone INHALE 2 PUFFS INTO THE LUNGS 2 (TWO) TIMES DAILY.   atenolol 100 MG tablet Commonly known as:  TENORMIN Take 50 mg by mouth daily.   atorvastatin 10 MG tablet Commonly known as:  LIPITOR TAKE 1 TABLET ONCE A DAY (AT BEDTIME)   azelastine 0.1 % nasal spray Commonly known as:  ASTELIN Place 2 sprays into both nostrils 2 (two) times daily.   glipiZIDE 10 MG 24 hr tablet Commonly known as:  GLUCOTROL XL Take 10 mg by mouth daily.   hydrochlorothiazide 25 MG tablet Commonly known as:  HYDRODIURIL Take 25 mg by mouth daily.   JANUVIA 100 MG tablet Generic drug:  sitaGLIPtin   metFORMIN 500 MG tablet Commonly known as:  GLUCOPHAGE 1,000 mg 2 (two) times  daily.   montelukast 10 MG tablet Commonly known as:  SINGULAIR TAKE 1 TABLET BY MOUTH EVERY DAY AS DIRECTED   MUCINEX DM PO Take by mouth as needed.   omeprazole 40 MG capsule Commonly known as:  PRILOSEC Take one capsule one daily as directed   ONE TOUCH ULTRA TEST test strip Generic drug:  glucose blood USE AS DIRECTED TEST DAILY   PROAIR HFA 108 (90 Base) MCG/ACT inhaler Generic drug:  albuterol INHALE 2 PUFFS EVERY 4-6 HOURS AS NEEDED FOR COUGH OR WHEEZE   ranitidine 300 MG tablet Commonly known as:  ZANTAC Take one tablet every evening       Past Medical History:  Diagnosis Date  . Allergic rhinitis   . Asthma   . Diabetes ([place])   . GERD (gastroesophageal reflux disease)   . Hiatal hernia   . Hypercholesterolemia   . Hypertension     Past Surgical History:  Procedure Laterality Date  . LASIK Bilateral     Review of systems negative except as noted in HPI / PMHx or noted below:  Review of Systems  Constitutional: Negative.   HENT: Negative.   Eyes: Negative.   Respiratory: Negative.   Cardiovascular: Negative.   Gastrointestinal: Negative.   Genitourinary: Negative.   Musculoskeletal: Negative.   Skin: Negative.   Neurological: Negative.   Endo/Heme/Allergies: Negative.   Psychiatric/Behavioral: Negative.      Objective:   Vitals:   10/13/16 1424  BP:  120/66  Pulse: 72  Resp: 20          Physical Exam  Constitutional: She is well-developed, well-nourished, and in no distress.  Nasal voice  HENT:  Head: Normocephalic.  Right Ear: Tympanic membrane, external ear and ear canal normal.  Left Ear: Tympanic membrane, external ear and ear canal normal.  Nose: Mucosal edema present. No rhinorrhea.  Mouth/Throat: Uvula is midline, oropharynx is clear and moist and mucous membranes are normal. No oropharyngeal exudate.  Eyes: Conjunctivae are normal.  Neck: Trachea normal. No tracheal tenderness present. No tracheal deviation present. No  thyromegaly present.  Cardiovascular: Normal rate, regular rhythm, S1 normal, S2 normal and normal heart sounds.   No murmur heard. Pulmonary/Chest: Breath sounds normal. No stridor. No respiratory distress. She has no wheezes. She has no rales.  Musculoskeletal: She exhibits no edema.  Lymphadenopathy:       Head (right side): No tonsillar adenopathy present.       Head (left side): No tonsillar adenopathy present.    She has no cervical adenopathy.  Neurological: She is alert. Gait normal.  Skin: No rash noted. She is not diaphoretic. No erythema. Nails show no clubbing.  Psychiatric: Mood and affect normal.    Diagnostics:    Spirometry was performed and demonstrated an FEV1 of 1.64 at 76 % of predicted.   Assessment and Plan:   1. Asthma, not well controlled, mild persistent, with acute exacerbation   2. Acute non-recurrent maxillary sinusitis   3. Other allergic rhinitis   4. LPRD (laryngopharyngeal reflux disease)     1. Continue montelukast 10 mg daily  2. Start Arnuity 200 one inhalation 1 time per day   3. Continue omeprazole 40 mg daily and add ranitidine 300 mg in evening    4. Treat infection with Ceftin 250 mg twice a day for the next 10 days plus Depo-Medrol 80 IM delivered in clinic today  5. Continue nasal azelastine 2 sprays each nostril twice a day if needed  6. Continue ProAir HFA 2 puffs every 4-6 hours if needed  7. Return to clinic in 6 months or earlier if problem  I will assume that [name] has an issue with inflammation and infection of her respiratory tract as well as some increased reflux-induced respiratory disease and have her utilize therapy mentioned above which includes increasing anti-inflammatory therapy for her respiratory tract, increasing therapy for her reflux, and using a broad-spectrum antibiotic as noted above. If she does well I will see her back in this clinic in 6 months but she will contact me during the interval should she have a  significant problem.  [name], MD Allergy / [facility]ology [place]

## 2016-10-13 NOTE — Patient Instructions (Addendum)
  1. Continue montelukast 10 mg daily  2. Start Arnuity 200 one inhalation 1 time per day   3. Continue omeprazole 40 mg daily and add ranitidine 300 mg in evening    4. Treat infection with Ceftin 250 mg twice a day for the next 10 days plus Depo-Medrol 80 IM delivered in clinic today  5. Continue nasal azelastine 2 sprays each nostril twice a day if needed  6. Continue ProAir HFA 2 puffs every 4-6 hours if needed  7. Return to clinic in 6 months or earlier if problem

## 2016-11-05 ENCOUNTER — Other Ambulatory Visit: Payer: Self-pay | Admitting: Allergy and Immunology

## 2016-12-23 ENCOUNTER — Other Ambulatory Visit: Payer: Self-pay | Admitting: Allergy and Immunology

## 2017-01-12 ENCOUNTER — Other Ambulatory Visit: Payer: Self-pay | Admitting: Allergy and Immunology

## 2017-02-02 ENCOUNTER — Other Ambulatory Visit: Payer: Self-pay | Admitting: Allergy and Immunology

## 2017-03-09 ENCOUNTER — Other Ambulatory Visit: Payer: Self-pay | Admitting: Allergy and Immunology

## 2017-04-08 ENCOUNTER — Other Ambulatory Visit: Payer: Self-pay | Admitting: Allergy and Immunology

## 2017-04-10 ENCOUNTER — Ambulatory Visit: Payer: Medicare Other | Admitting: Allergy and Immunology

## 2017-04-17 ENCOUNTER — Ambulatory Visit (INDEPENDENT_AMBULATORY_CARE_PROVIDER_SITE_OTHER): Payer: Medicare Other | Admitting: Allergy and Immunology

## 2017-04-17 ENCOUNTER — Encounter: Payer: Self-pay | Admitting: Allergy and Immunology

## 2017-04-17 VITALS — BP 152/76 | HR 60 | Resp 18

## 2017-04-17 DIAGNOSIS — K219 Gastro-esophageal reflux disease without esophagitis: Secondary | ICD-10-CM

## 2017-04-17 DIAGNOSIS — J4530 Mild persistent asthma, uncomplicated: Secondary | ICD-10-CM

## 2017-04-17 DIAGNOSIS — J3089 Other allergic rhinitis: Secondary | ICD-10-CM | POA: Diagnosis not present

## 2017-04-17 MED ORDER — OMEPRAZOLE 40 MG PO CPDR: DELAYED_RELEASE_CAPSULE | ORAL | 1 refills | Status: DC

## 2017-04-17 MED ORDER — ALBUTEROL SULFATE HFA 108 (90 BASE) MCG/ACT IN AERS: INHALATION_SPRAY | RESPIRATORY_TRACT | 0 refills | Status: DC

## 2017-04-17 MED ORDER — FLUTICASONE FUROATE 200 MCG/ACT IN AEPB: 1.0000 | INHALATION_SPRAY | Freq: Every day | RESPIRATORY_TRACT | 1 refills | Status: DC

## 2017-04-17 MED ORDER — RANITIDINE HCL 300 MG PO TABS: ORAL_TABLET | ORAL | 1 refills | Status: DC

## 2017-04-17 MED ORDER — AZELASTINE HCL 0.1 % NA SOLN: NASAL | 1 refills | Status: DC

## 2017-04-17 MED ORDER — MONTELUKAST SODIUM 10 MG PO TABS: 10.0000 mg | ORAL_TABLET | Freq: Every day | ORAL | 1 refills | Status: DC

## 2017-04-17 NOTE — Progress Notes (Signed)
Follow-up Note  Referring Provider: Enid Skeens., MD Primary Provider: Enid Skeens., MD Date of Office Visit: 04/17/2017  Subjective:   Holly Harper (DOB: March 17, 1946) is a 71 y.o. female who returns to the [facility] on 04/17/2017 in re-evaluation of the following:  HPI: Sylina presents to this clinic in reevaluation of her asthma and allergic rhinoconjunctivitis and LPR. Her last visit to this clinic was April 2018.  Overall she is doing relatively well with her asthma and has not required a systemic steroid. She still uses a short acting bronchodilator about twice a week. She had some problems obtaining her Arnuity and has tapered off this medication because of an expense issue.  Her nose has been doing relatively well and she uses nasal Azelastine a few times a week. She has not required an antibiotic to treat an episode of sinusitis.  Her reflux and her throat are doing well while using omeprazole. She has not had to add in ranitidine recently.  She did obtain the flu vaccine.  Allergies as of 04/17/2017      Reactions   Amoxicillin Hives   Cinobac [cinoxacin]    Macrodantin [nitrofurantoin Macrocrystal] Hives      Medication List      atenolol 100 MG tablet Commonly known as:  TENORMIN Take 50 mg by mouth daily.   atorvastatin 10 MG tablet Commonly known as:  LIPITOR TAKE 1 TABLET ONCE A DAY (AT BEDTIME)   azelastine 0.1 % nasal spray Commonly known as:  ASTELIN Place 2 sprays into both nostrils 2 (two) times daily.   Fluticasone Furoate 200 MCG/ACT Aepb Commonly known as:  ARNUITY ELLIPTA Inhale 1 puff into the lungs daily.   glipiZIDE 10 MG 24 hr tablet Commonly known as:  GLUCOTROL XL Take 10 mg by mouth daily.   hydrochlorothiazide 25 MG tablet Commonly known as:  HYDRODIURIL Take 25 mg by mouth daily.   JANUVIA 100 MG tablet Generic drug:  sitaGLIPtin   metFORMIN 500 MG tablet Commonly known as:  GLUCOPHAGE 1,000 mg  2 (two) times daily.   montelukast 10 MG tablet Commonly known as:  SINGULAIR TAKE 1 TABLET BY MOUTH EVERY DAY AS DIRECTED   MUCINEX DM PO Take by mouth as needed.   omeprazole 40 MG capsule Commonly known as:  PRILOSEC TAKE ONE CAPSULE ONCE DAILY AS DIRECTED   ONE TOUCH ULTRA TEST test strip Generic drug:  glucose blood USE AS DIRECTED TEST DAILY   PROAIR HFA 108 (90 Base) MCG/ACT inhaler Generic drug:  albuterol INHALE 2 PUFFS EVERY 4-6 HOURS AS NEEDED FOR COUGH OR WHEEZE   albuterol 108 (90 Base) MCG/ACT inhaler Commonly known as:  PROAIR HFA INHALE 2 PUFFS EVERY 4-6 HOURS AS NEEDED FOR COUGH OR WHEEZE   ranitidine 300 MG tablet Commonly known as:  ZANTAC Take one tablet every evening       Past Medical History:  Diagnosis Date  . Allergic rhinitis   . Asthma   . Diabetes ([place])   . GERD (gastroesophageal reflux disease)   . Hiatal hernia   . Hypercholesterolemia   . Hypertension     Past Surgical History:  Procedure Laterality Date  . LASIK Bilateral     Review of systems negative except as noted in HPI / PMHx or noted below:  Review of Systems  Constitutional: Negative.   HENT: Negative.   Eyes: Negative.   Respiratory: Negative.   Cardiovascular: Negative.   Gastrointestinal: Negative.  Genitourinary: Negative.   Musculoskeletal: Negative.   Skin: Negative.   Neurological: Negative.   Endo/Heme/Allergies: Negative.   Psychiatric/Behavioral: Negative.      Objective:   Vitals:   04/17/17 1043  BP: (!) 152/76  Pulse: 60  Resp: 18          Physical Exam  Constitutional: She is well-developed, well-nourished, and in no distress.  HENT:  Head: Normocephalic.  Right Ear: Tympanic membrane, external ear and ear canal normal.  Left Ear: Tympanic membrane, external ear and ear canal normal.  Nose: Nose normal. No mucosal edema or rhinorrhea.  Mouth/Throat: Uvula is midline, oropharynx is clear and moist and mucous membranes are normal.  No oropharyngeal exudate.  Eyes: Conjunctivae are normal.  Neck: Trachea normal. No tracheal tenderness present. No tracheal deviation present. No thyromegaly present.  Cardiovascular: Normal rate, regular rhythm, S1 normal, S2 normal and normal heart sounds.   No murmur heard. Pulmonary/Chest: Breath sounds normal. No stridor. No respiratory distress. She has no wheezes. She has no rales.  Musculoskeletal: She exhibits no edema.  Lymphadenopathy:       Head (right side): No tonsillar adenopathy present.       Head (left side): No tonsillar adenopathy present.    She has no cervical adenopathy.  Neurological: She is alert. Gait normal.  Skin: No rash noted. She is not diaphoretic. No erythema. Nails show no clubbing.  Psychiatric: Mood and affect normal.    Diagnostics:    Spirometry was performed and demonstrated an FEV1 of 1.84 at 86 % of predicted.  The patient had an Asthma Control Test with the following results: ACT Total Score: 23.    Assessment and Plan:   1. Asthma, well controlled, mild persistent   2. Other allergic rhinitis   3. LPRD (laryngopharyngeal reflux disease)     1. Continue montelukast 10 mg daily  2. Continue Arnuity 200 one inhalation 1 time per day (actually can use 3 times per week)  3. Continue omeprazole 40 mg daily and add ranitidine 300 mg in evening if needed   4. Continue nasal azelastine 2 sprays each nostril twice a day if needed  5. Continue ProAir HFA 2 puffs every 4-6 hours if needed  6. Return to clinic in 6 months or earlier if problem   Aleli appears to be doing relatively well on her current plan. Hopefully she will be able to obtain a 3 month prescription of Arnuity at a reasonable price and I think she will do well if we can use this medication at least 3 times a week and that is what we will aim for as she moves forward. She will continue to treat her upper airways and her reflux-induced respiratory disease as noted above. I will see  her back in this clinic in 6 months or earlier if there is a problem.  [name], MD Allergy / Immunology [place]

## 2017-04-17 NOTE — Patient Instructions (Addendum)
  1. Continue montelukast 10 mg daily  2. Continue Arnuity 200 one inhalation 1 time per day (actually can use 3 times per week)  3. Continue omeprazole 40 mg daily and add ranitidine 300 mg in evening if needed   4. Continue nasal azelastine 2 sprays each nostril twice a day if needed  5. Continue ProAir HFA 2 puffs every 4-6 hours if needed  6. Return to clinic in 6 months or earlier if problem

## 2017-05-02 ENCOUNTER — Other Ambulatory Visit: Payer: Self-pay | Admitting: Allergy and Immunology

## 2017-05-08 ENCOUNTER — Other Ambulatory Visit: Payer: Self-pay | Admitting: Allergy and Immunology

## 2017-06-19 ENCOUNTER — Other Ambulatory Visit: Payer: Self-pay | Admitting: Allergy and Immunology

## 2017-06-19 MED ORDER — ALBUTEROL SULFATE HFA 108 (90 BASE) MCG/ACT IN AERS: INHALATION_SPRAY | RESPIRATORY_TRACT | 0 refills | Status: DC

## 2017-06-19 NOTE — Telephone Encounter (Signed)
Holly Harper needs a prescription sent in to [facility] for PRO-AIR.

## 2017-06-19 NOTE — Telephone Encounter (Signed)
Prescription has been sent.

## 2017-09-16 ENCOUNTER — Other Ambulatory Visit: Payer: Self-pay | Admitting: Allergy and Immunology

## 2017-10-16 ENCOUNTER — Ambulatory Visit: Payer: Medicare Other | Admitting: Allergy and Immunology

## 2017-10-23 ENCOUNTER — Ambulatory Visit: Payer: Self-pay | Admitting: Allergy and Immunology

## 2017-11-04 ENCOUNTER — Other Ambulatory Visit: Payer: Self-pay | Admitting: Allergy and Immunology

## 2017-11-21 ENCOUNTER — Other Ambulatory Visit: Payer: Self-pay | Admitting: *Deleted

## 2017-11-21 ENCOUNTER — Telehealth: Payer: Self-pay | Admitting: Allergy and Immunology

## 2017-11-21 MED ORDER — OMEPRAZOLE 40 MG PO CPDR: DELAYED_RELEASE_CAPSULE | ORAL | 0 refills | Status: DC

## 2017-11-21 MED ORDER — MONTELUKAST SODIUM 10 MG PO TABS: 10.0000 mg | ORAL_TABLET | Freq: Every day | ORAL | 0 refills | Status: DC

## 2017-11-21 NOTE — Telephone Encounter (Signed)
Will send one refill of each, no more refills until appt.

## 2017-11-21 NOTE — Telephone Encounter (Signed)
Holly Harper called in and would like a refill on her Singulair and Omeprazole.  [place] was informed she was due for an appointment but did not make one.

## 2017-12-13 ENCOUNTER — Other Ambulatory Visit: Payer: Self-pay | Admitting: Allergy and Immunology

## 2017-12-15 ENCOUNTER — Other Ambulatory Visit: Payer: Self-pay | Admitting: Allergy and Immunology

## 2018-02-01 ENCOUNTER — Other Ambulatory Visit: Payer: Self-pay | Admitting: Allergy and Immunology

## 2018-02-02 ENCOUNTER — Other Ambulatory Visit: Payer: Self-pay | Admitting: Allergy and Immunology

## 2018-02-02 ENCOUNTER — Other Ambulatory Visit: Payer: Self-pay | Admitting: *Deleted

## 2018-02-14 ENCOUNTER — Other Ambulatory Visit: Payer: Self-pay | Admitting: Allergy and Immunology

## 2018-03-16 ENCOUNTER — Other Ambulatory Visit: Payer: Self-pay | Admitting: Allergy and Immunology

## 2018-03-18 ENCOUNTER — Other Ambulatory Visit: Payer: Self-pay | Admitting: Allergy and Immunology

## 2018-03-22 ENCOUNTER — Encounter: Payer: Self-pay | Admitting: Allergy and Immunology

## 2018-03-22 ENCOUNTER — Ambulatory Visit (INDEPENDENT_AMBULATORY_CARE_PROVIDER_SITE_OTHER): Payer: Medicare Other | Admitting: Allergy and Immunology

## 2018-03-22 VITALS — BP 142/78 | HR 64 | Resp 16

## 2018-03-22 DIAGNOSIS — J3089 Other allergic rhinitis: Secondary | ICD-10-CM | POA: Diagnosis not present

## 2018-03-22 DIAGNOSIS — J4530 Mild persistent asthma, uncomplicated: Secondary | ICD-10-CM

## 2018-03-22 DIAGNOSIS — K219 Gastro-esophageal reflux disease without esophagitis: Secondary | ICD-10-CM | POA: Diagnosis not present

## 2018-03-22 MED ORDER — FLUTICASONE PROPIONATE 50 MCG/ACT NA SUSP: NASAL | 1 refills | Status: DC

## 2018-03-22 MED ORDER — FAMOTIDINE 40 MG PO TABS: ORAL_TABLET | ORAL | 1 refills | Status: DC

## 2018-03-22 NOTE — Progress Notes (Signed)
Follow-up Note  Referring Provider: Enid Harper., MD Primary Provider: Enid Harper., MD Date of Office Visit: 03/22/2018  Subjective:   Holly Harper (DOB: 11/02/1945) is a 72 y.o. female who returns to the [facility] on 03/22/2018 in re-evaluation of the following:  HPI: Holly Harper presents to this clinic in evaluation of asthma and allergic rhinoconjunctivitis and LPR.  Her last visit to this clinic was 17 April 2017.  She has had an excellent year without the requirement for a systemic steroid or antibiotic.  She has been consistently using her Arnuity about 3 times per week and while doing so she uses a short acting bronchodilator about twice a week and can exercise without any difficulty.  Her nose has been doing very well while using nasal antihistamine and occasionally some nasal steroid.  Her reflux is still causing some issues with her throat on occasion.  She did visit with ENT this year and sure enough she had evidence of significant LPR.  She is not really using an H2 receptor blocker in addition to her omeprazole at this point.  Allergies as of 03/22/2018      Reactions   Amoxicillin Hives   Cinobac [cinoxacin]    Macrodantin [nitrofurantoin Macrocrystal] Hives      Medication List      albuterol 108 (90 Base) MCG/ACT inhaler Commonly known as:  PROVENTIL HFA;VENTOLIN HFA INHALE 2 PUFFS EVERY 4-6 HOURS AS NEEDED FOR COUGH OR WHEEZE   atenolol 100 MG tablet Commonly known as:  TENORMIN Take 50 mg by mouth daily.   azelastine 0.1 % nasal spray Commonly known as:  ASTELIN Can use two sprays in each nostril twice daily if needed.   Fluticasone Furoate 200 MCG/ACT Aepb Inhale 1 Dose into the lungs daily. Rinse, gargle, and spit after use.   glipiZIDE 10 MG 24 hr tablet Commonly known as:  GLUCOTROL XL Take 10 mg by mouth daily.   hydrochlorothiazide 25 MG tablet Commonly known as:  HYDRODIURIL Take 25 mg by mouth daily.   JANUVIA  100 MG tablet Generic drug:  sitaGLIPtin   MELATONIN GUMMIES PO Take by mouth.   metFORMIN 500 MG tablet Commonly known as:  GLUCOPHAGE 1,000 mg 2 (two) times daily.   montelukast 10 MG tablet Commonly known as:  SINGULAIR Take 1 tablet (10 mg total) by mouth daily.   [place] DM PO Take by mouth as needed.   omeprazole 40 MG capsule Commonly known as:  PRILOSEC Take one capsule once daily   ONE TOUCH ULTRA TEST test strip Generic drug:  glucose blood USE AS DIRECTED TEST DAILY   RED YEAST RICE PO Take by mouth daily.       Past Medical History:  Diagnosis Date  . Allergic rhinitis   . Asthma   . Diabetes ([place])   . GERD (gastroesophageal reflux disease)   . Hiatal hernia   . Hypercholesterolemia   . Hypertension     Past Surgical History:  Procedure Laterality Date  . LASIK Bilateral     Review of systems negative except as noted in HPI / PMHx or noted below:  Review of Systems  Constitutional: Negative.   HENT: Negative.   Eyes: Negative.   Respiratory: Negative.   Cardiovascular: Negative.   Gastrointestinal: Negative.   Genitourinary: Negative.   Musculoskeletal: Negative.   Skin: Negative.   Neurological: Negative.   Endo/Heme/Allergies: Negative.   Psychiatric/Behavioral: Negative.      Objective:  Vitals:   03/22/18 1114  BP: (!) 142/78  Pulse: 64  Resp: 16          Physical Exam  HENT:  Head: Normocephalic.  Right Ear: Tympanic membrane, external ear and ear canal normal.  Left Ear: Tympanic membrane, external ear and ear canal normal.  Nose: Nose normal. No mucosal edema or rhinorrhea.  Mouth/Throat: Uvula is midline, oropharynx is clear and moist and mucous membranes are normal. No oropharyngeal exudate.  Eyes: Conjunctivae are normal.  Neck: Trachea normal. No tracheal tenderness present. No tracheal deviation present. No thyromegaly present.  Cardiovascular: Normal rate, regular rhythm, S1 normal, S2 normal and normal  heart sounds.  No murmur heard. Pulmonary/Chest: Breath sounds normal. No stridor. No respiratory distress. She has no wheezes. She has no rales.  Musculoskeletal: She exhibits no edema.  Lymphadenopathy:       Head (right side): No tonsillar adenopathy present.       Head (left side): No tonsillar adenopathy present.    She has no cervical adenopathy.  Neurological: She is alert.  Skin: No rash noted. She is not diaphoretic. No erythema. Nails show no clubbing.    Diagnostics:    Spirometry was performed and demonstrated an FEV1 of 1.99 at 94 % of predicted.  The patient had an Asthma Control Test with the following results: ACT Total Score: 22.    Assessment and Plan:   1. Asthma, well controlled, mild persistent   2. Other allergic rhinitis   3. LPRD (laryngopharyngeal reflux disease)     1. Continue montelukast 10 mg daily  2. Continue Arnuity 200 one inhalation 3-7 times per week  3. Continue omeprazole 40 mg daily and can add famotidine 40 mg in evening if needed   4. Continue nasal azelastine 2 sprays each nostril twice a day if needed  5. Can restart flonase 1 spray each nostril 1-2 times per day during periods of upper airway symptoms  6. Continue ProAir HFA 2 puffs every 4-6 hours if needed  7. Return to clinic in 6 months or earlier if problem  8. Obtain fall flu vaccine  Holly Harper appears to be doing relatively well on her current plan and she will continue to utilize a leukotriene modifier and an inhaled steroid and a nasal steroid to address her airway inflammation and continue to utilize medical therapy directed against reflux as noted above.  I will see her back in this clinic in 6 months or earlier if there is a problem.  Holly Katz, MD Allergy / Immunology [place]

## 2018-03-22 NOTE — Patient Instructions (Addendum)
  1. Continue montelukast 10 mg daily  2. Continue Arnuity 200 one inhalation 3-7 times per week  3. Continue omeprazole 40 mg daily and can add famotidine 40 mg in evening if needed   4. Continue nasal azelastine 2 sprays each nostril twice a day if needed  5. Can restart flonase 1 spray each nostril 1-2 times per day during periods of upper airway symptoms  6. Continue ProAir HFA 2 puffs every 4-6 hours if needed  7. Return to clinic in 6 months or earlier if problem  8. Obtain fall flu vaccine

## 2018-03-25 ENCOUNTER — Encounter: Payer: Self-pay | Admitting: Allergy and Immunology

## 2018-04-13 ENCOUNTER — Other Ambulatory Visit: Payer: Self-pay | Admitting: Allergy and Immunology

## 2018-04-20 ENCOUNTER — Other Ambulatory Visit: Payer: Self-pay | Admitting: Allergy and Immunology

## 2018-06-13 ENCOUNTER — Other Ambulatory Visit: Payer: Self-pay | Admitting: Allergy and Immunology

## 2018-06-20 DIAGNOSIS — C3490 Malignant neoplasm of unspecified part of unspecified bronchus or lung: Secondary | ICD-10-CM | POA: Insufficient documentation

## 2018-06-20 HISTORY — DX: Malignant neoplasm of unspecified part of unspecified bronchus or lung: C34.90

## 2018-08-14 ENCOUNTER — Other Ambulatory Visit: Payer: Self-pay | Admitting: Allergy and Immunology

## 2018-08-17 ENCOUNTER — Other Ambulatory Visit: Payer: Self-pay

## 2018-08-17 MED ORDER — MONTELUKAST SODIUM 10 MG PO TABS: 10.0000 mg | ORAL_TABLET | Freq: Every day | ORAL | 0 refills | Status: DC

## 2018-09-08 ENCOUNTER — Other Ambulatory Visit: Payer: Self-pay | Admitting: Allergy and Immunology

## 2018-09-10 NOTE — Telephone Encounter (Signed)
Courtesy refill  

## 2018-09-14 ENCOUNTER — Other Ambulatory Visit: Payer: Self-pay | Admitting: Allergy and Immunology

## 2018-09-14 MED ORDER — FAMOTIDINE 40 MG PO TABS: ORAL_TABLET | ORAL | 1 refills | Status: DC

## 2018-09-14 MED ORDER — OMEPRAZOLE 40 MG PO CPDR: DELAYED_RELEASE_CAPSULE | ORAL | 0 refills | Status: DC

## 2018-09-14 MED ORDER — FLUTICASONE FUROATE 200 MCG/ACT IN AEPB: 1.0000 | INHALATION_SPRAY | Freq: Every day | RESPIRATORY_TRACT | 1 refills | Status: DC

## 2018-09-14 MED ORDER — AZELASTINE HCL 0.1 % NA SOLN: NASAL | 1 refills | Status: DC

## 2018-09-14 MED ORDER — ALBUTEROL SULFATE HFA 108 (90 BASE) MCG/ACT IN AERS: INHALATION_SPRAY | RESPIRATORY_TRACT | 1 refills | Status: DC

## 2018-09-14 MED ORDER — FLUTICASONE PROPIONATE 50 MCG/ACT NA SUSP: NASAL | 1 refills | Status: DC

## 2018-09-14 MED ORDER — MONTELUKAST SODIUM 10 MG PO TABS: 10.0000 mg | ORAL_TABLET | Freq: Every day | ORAL | 1 refills | Status: DC

## 2018-09-14 NOTE — Telephone Encounter (Signed)
Holly Harper called in to cancel her appointment due to COVID and would like refills on all of her medications sent to [facility] in [place].

## 2018-09-14 NOTE — Addendum Note (Signed)
Addended by: Farrel Demark R on: 09/14/2018 01:11 PM   Modules accepted: Orders

## 2018-09-14 NOTE — Telephone Encounter (Signed)
Refills sent to requested pharmacy. 

## 2018-09-17 ENCOUNTER — Other Ambulatory Visit: Payer: Self-pay

## 2018-09-17 NOTE — Telephone Encounter (Signed)
Patient is requesting 90 day supply but will need to have an OV before any further refills are given

## 2018-09-18 MED ORDER — FAMOTIDINE 40 MG PO TABS: ORAL_TABLET | ORAL | 0 refills | Status: DC

## 2018-09-18 NOTE — Addendum Note (Signed)
Addended by: Valere Dross on: 09/18/2018 04:18 PM   Modules accepted: Orders

## 2018-09-18 NOTE — Telephone Encounter (Signed)
Will send in 30 days supply

## 2018-09-19 ENCOUNTER — Ambulatory Visit: Payer: Medicare Other | Admitting: Allergy and Immunology

## 2018-10-01 ENCOUNTER — Other Ambulatory Visit: Payer: Self-pay

## 2018-10-01 MED ORDER — FAMOTIDINE 40 MG PO TABS: ORAL_TABLET | ORAL | 0 refills | Status: DC

## 2018-11-05 ENCOUNTER — Other Ambulatory Visit: Payer: Self-pay | Admitting: Allergy and Immunology

## 2018-12-03 ENCOUNTER — Other Ambulatory Visit: Payer: Self-pay | Admitting: Allergy and Immunology

## 2019-01-05 ENCOUNTER — Other Ambulatory Visit: Payer: Self-pay | Admitting: Allergy and Immunology

## 2019-01-09 ENCOUNTER — Ambulatory Visit (INDEPENDENT_AMBULATORY_CARE_PROVIDER_SITE_OTHER): Payer: Medicare Other | Admitting: Allergy and Immunology

## 2019-01-09 ENCOUNTER — Encounter: Payer: Self-pay | Admitting: Allergy and Immunology

## 2019-01-09 ENCOUNTER — Other Ambulatory Visit: Payer: Self-pay

## 2019-01-09 VITALS — BP 142/80 | HR 70 | Temp 98.0°F | Resp 16 | Ht 63.0 in | Wt 116.2 lb

## 2019-01-09 DIAGNOSIS — J3089 Other allergic rhinitis: Secondary | ICD-10-CM

## 2019-01-09 DIAGNOSIS — K219 Gastro-esophageal reflux disease without esophagitis: Secondary | ICD-10-CM | POA: Diagnosis not present

## 2019-01-09 DIAGNOSIS — J4530 Mild persistent asthma, uncomplicated: Secondary | ICD-10-CM

## 2019-01-09 MED ORDER — MONTELUKAST SODIUM 10 MG PO TABS: 10.0000 mg | ORAL_TABLET | Freq: Every day | ORAL | 1 refills | Status: DC

## 2019-01-09 NOTE — Progress Notes (Signed)
Holly Harper - Holly Harper - Holly Harper   Follow-up Note  Referring Provider: Enid Skeens., MD Primary Provider: Patient, No Pcp Per Date of Office Visit: 01/09/2019  Subjective:   Holly Harper (DOB: 10/28/1945) is a 73 y.o. female who returns to the [facility] Harper on 01/09/2019 in re-evaluation of the following:  HPI: Holly Harper returns to this clinic in reevaluation of her asthma and allergic rhinoconjunctivitis and LPR.  I last saw her in this clinic on 22 March 2018.  She believes that her asthma and allergic rhinitis were under excellent control with minimal amounts of medications as she went through the past 8 months.  She continues to use a leukotriene modifier but rarely uses any Arnuity and rarely uses any Flonase.  Unfortunately, about 2 weeks ago she developed a headache and some thick mucus in her throat and she went to see [facility] urgent care in [place] and was treated with a Kenalog injection and she has resolved this issue.  That was her only systemic steroid since her last visit.  She has not required the addition of an antibiotic since I last saw her in this clinic.  She still continues to have issues with throat clearing and postnasal drip on a chronic and daily basis.  She is only using omeprazole and not famotidine.  She continues to drink 1 coffee per day.  Allergies as of 01/09/2019      Reactions   Amoxicillin Hives   Cinobac [cinoxacin]    Macrodantin [nitrofurantoin Macrocrystal] Hives      Medication List    albuterol 108 (90 Base) MCG/ACT inhaler Commonly known as: ProAir HFA USE 2 INHALATIONS ORALLY   EVERY 4 TO 6 HOURS AS      NEEDED FOR COUGH OR WHEEZE   atenolol 100 MG tablet Commonly known as: TENORMIN Take 50 mg by mouth daily.   atorvastatin 10 MG tablet Commonly known as: LIPITOR   azelastine 0.1 % nasal spray Commonly known as: ASTELIN Can use two sprays in each nostril twice daily if needed.    famotidine 40 MG tablet Commonly known as: PEPCID TAKE 1 TABLET BY MOUTH EVERY EVENING IF NEEDED   fluticasone 50 MCG/ACT nasal spray Commonly known as: FLONASE Can use one spray in each nostril one to two times daily if needed.   Fluticasone Furoate 200 MCG/ACT Aepb Commonly known as: Arnuity Ellipta Inhale 1 Dose into the lungs daily. Rinse, gargle, and spit after use.   glipiZIDE 10 MG 24 hr tablet Commonly known as: GLUCOTROL XL Take 10 mg by mouth daily.   hydrochlorothiazide 25 MG tablet Commonly known as: HYDRODIURIL Take 25 mg by mouth daily.   Januvia 100 MG tablet Generic drug: sitaGLIPtin Take 100 mg by mouth daily.   MELATONIN GUMMIES PO Take by mouth.   metFORMIN 500 MG tablet Commonly known as: GLUCOPHAGE 1,000 mg 2 (two) times daily.   montelukast 10 MG tablet Commonly known as: SINGULAIR TAKE 1 TABLET BY MOUTH EVERY DAY   MUCINEX DM PO Take by mouth as needed.   omeprazole 40 MG capsule Commonly known as: PRILOSEC Take 1 capsule by mouth daily   ONE TOUCH ULTRA TEST test strip Generic drug: glucose blood USE AS DIRECTED TEST DAILY   pioglitazone 30 MG tablet Commonly known as: ACTOS TAKE 1 TABLET BY MOUTH ONCE DAILY FOR 90 DAYS       Past Medical History:  Diagnosis Date  . Allergic rhinitis   .  Asthma   . Diabetes ([place])   . GERD (gastroesophageal reflux disease)   . Hiatal hernia   . Hypercholesterolemia   . Hypertension     Past Surgical History:  Procedure Laterality Date  . LASIK Bilateral     Review of systems negative except as noted in HPI / PMHx or noted below:  Review of Systems  Constitutional: Negative.   HENT: Negative.   Eyes: Negative.   Respiratory: Negative.   Cardiovascular: Negative.   Gastrointestinal: Negative.   Genitourinary: Negative.   Musculoskeletal: Negative.   Skin: Negative.   Neurological: Negative.   Endo/Heme/Allergies: Negative.   Psychiatric/Behavioral: Negative.      Objective:    Vitals:   01/09/19 0905  BP: (!) 142/80  Pulse: 70  Resp: 16  Temp: 98 F (36.7 C)  SpO2: 98%   Height: 5\' 3"  (160 cm)  Weight: 116 lb 3.2 oz (52.7 kg)   Physical Exam Constitutional:      Appearance: She is not diaphoretic.  HENT:     Head: Normocephalic.     Right Ear: Tympanic membrane, ear canal and external ear normal.     Left Ear: Tympanic membrane, ear canal and external ear normal.     Nose: Nose normal. No mucosal edema or rhinorrhea.     Mouth/Throat:     Pharynx: Uvula midline. No oropharyngeal exudate.  Eyes:     Conjunctiva/sclera: Conjunctivae normal.  Neck:     Thyroid: No thyromegaly.     Trachea: Trachea normal. No tracheal tenderness or tracheal deviation.  Cardiovascular:     Rate and Rhythm: Normal rate and regular rhythm.     Heart sounds: Normal heart sounds, S1 normal and S2 normal. No murmur.  Pulmonary:     Effort: No respiratory distress.     Breath sounds: Normal breath sounds. No stridor. No wheezing or rales.  Lymphadenopathy:     Head:     Right side of head: No tonsillar adenopathy.     Left side of head: No tonsillar adenopathy.     Cervical: No cervical adenopathy.  Skin:    Findings: No erythema or rash.     Nails: There is no clubbing.   Neurological:     Mental Status: She is alert.     Diagnostics:    Spirometry was performed and demonstrated an FEV1 of 1.73 at 83 % of predicted.  Assessment and Plan:   1. Asthma, well controlled, mild persistent   2. Other allergic rhinitis   3. LPRD (laryngopharyngeal reflux disease)     1. Continue montelukast 10 mg daily  2. Continue Arnuity 200 one inhalation 3-7 times per week depending on disease activity  3. Continue omeprazole 40 mg daily plus famotidine 40 mg in evening   4.  Continue Flonase 1 spray each nostril 1-2 times per day during periods of upper airway symptoms  5. Continue nasal azelastine 2 sprays each nostril twice a day if needed  6. Continue ProAir HFA  2 puffs every 4-6 hours if needed  7. Return to clinic in 6 months or earlier if problem  8. Obtain fall flu vaccine (and COVID vaccine)  Overall Holly Harper's airway issue is doing relatively well on her current plan and she will remain on a leukotriene modifier and make a determination about a dose of Arnuity and Flonase that is required depending on disease activity.  I did encourage her to consistently use omeprazole and famotidine in combination as she still has a  lot of throat clearing and postnasal drip which is probably from her LPR.  Assuming she does well I will see her back in this clinic in 6 months or earlier if there is a problem.  [name], MD Allergy / Immunology [place]

## 2019-01-09 NOTE — Patient Instructions (Signed)
  1. Continue montelukast 10 mg daily  2. Continue Arnuity 200 one inhalation 3-7 times per week depending on disease activity  3. Continue omeprazole 40 mg daily plus famotidine 40 mg in evening   4.  Continue Flonase 1 spray each nostril 1-2 times per day during periods of upper airway symptoms  5. Continue nasal azelastine 2 sprays each nostril twice a day if needed  6. Continue ProAir HFA 2 puffs every 4-6 hours if needed  7. Return to clinic in 6 months or earlier if problem  8. Obtain fall flu vaccine (and COVID vaccine)

## 2019-01-10 ENCOUNTER — Encounter: Payer: Self-pay | Admitting: Allergy and Immunology

## 2019-01-14 ENCOUNTER — Other Ambulatory Visit: Payer: Self-pay | Admitting: Allergy and Immunology

## 2019-01-15 ENCOUNTER — Other Ambulatory Visit: Payer: Self-pay | Admitting: Allergy and Immunology

## 2019-02-05 NOTE — Progress Notes (Signed)
Cardiology Office Note:    Date:  02/06/2019   ID:  Holly Harper, DOB 1945/07/04, MRN [id number]  PCP:  Holly Jews, FNP  Cardiologist:  Holly More, MD   Referring MD: Holly Jews, FNP  ASSESSMENT:    1. Essential hypertension   2. Mixed hyperlipidemia   3. Type 2 diabetes mellitus without complication, without long-term current use of insulin (HCC)    PLAN:    In order of problems listed above:  1. HTN - Approx 15 year history. Recent increase in BP which she attributes to stress of husband's recent illness. Will start Olmesartan HCTZ 20-12.5 and stop her HCTZ 25. Continue Atenolol 50mg . Echocardiogram ordered in setting of longstanding HTN. Recommend she check her BP daily and keep a log - she has an arm cuff at home.  2. HLD - Recent lipid profile with LDL 39. Continue Atorvastatin 10mg  daily. Triglycerides were elevated at 181 - anticipate will improve with improvement in her A1c.  3. DM2 - 01/20/19 A1c 8.1. Has appt with Holly Harper of endocrinology upcoming. She is presently on Glipizide 10mg , Metformin 1000mg  BID, Januvia, and Actos which was recently started. Would recommend discontinuation of Actos in favor of an injectable SGLT2 such as Trulicity or Victoza. Concern for Actos exacerbating BP and concern for potential cause of HF. Will defer to her PCP/new endocrinologist. Will send this note to Holly Harper.  4. EKG nonspecific changes - EKG independently reviewed 01/20/19 with SR nonspecific ST/T wave changes (t wave inversion V1 V2) with poor R wave progression. Changes likely associated with longterm HTN and/or DM2. Plan for echocardiogram for further evaluation. No anginal symptoms, no indication for ischemic evaluation.   Next appointment: 2 months   Medication Adjustments/Labs and Tests Ordered: Current medicines are reviewed at length with the patient today.  Concerns regarding medicines are outlined above.  Orders Placed This Encounter  Procedures  .  ECHOCARDIOGRAM COMPLETE   Meds ordered this encounter  Medications  . olmesartan-hydrochlorothiazide (BENICAR HCT) 20-12.5 MG tablet    Sig: Take 1 tablet by mouth daily.    Dispense:  90 tablet    Refill:  1     Chief Complaint  Patient presents with  . Hypertension  Chief Complaint: 73 yo female with PMH HTN, HLD,GERD, DM2 presents for consultation of longstanding HTN.   History of Present Illness:    Holly Harper is a 73 y.o. female who is being seen today for the evaluation of hypertension  at the request of Holly Harper, [place].  Tells me she was diagnosed with hypertension approximately 15 years ago.  Has been on the same doses of atenolol 50 mg and HCTZ 25 mg for a number of years.  Recently has seen her blood pressure increased which she attributes to stress and anxiety over her husband's recent health ailments.  She checks her blood pressure intermittently at home with an arm cuff.  Recently started on Actos and we discussed this as a potential trigger for elevated blood pressures. Tells me she has recently been referred to endocrinology by her primary care provider and has an appointment in September with Holly Harper. Reports her blood sugars recently have not been well controlled as she has been forgetful to check them and "not eating right".  EKG 01/20/19 independently reviewed SR rate 63 with nonspecific ST/T wave abnormality (T-wave inversion V1, V2)  and poor R-wave progression. In the setting of longstanding HTN and DM2 will require echocardiogram.   Labs at her PCP 01/20/19  with Hb 14.6, GFR 58, creatinine 0.97, K 3.8, A1c 8.1, TSH 1.04, free T4 1.85 Total cholesterol 139, triglycerides 181, HDL 64, LDL 39.   She denies chest pain, SOB, palpitations, LE edema. Reports light headedness only when her blood sugar is low.   Past Medical History:  Diagnosis Date  . Allergic rhinitis   . Asthma   . Diabetes ([place])   . GERD (gastroesophageal reflux disease)   . Hiatal hernia   .  Hypercholesterolemia   . Hypertension     Past Surgical History:  Procedure Laterality Date  . LASIK Bilateral     Current Medications: Current Meds  Medication Sig  . albuterol (VENTOLIN HFA) 108 (90 Base) MCG/ACT inhaler USE 2 INHALATIONS ORALLY   EVERY 4 TO 6 HOURS AS      NEEDED FOR COUGH OR WHEEZE  . atenolol (TENORMIN) 100 MG tablet Take 50 mg by mouth daily.  Marland Kitchen atorvastatin (LIPITOR) 10 MG tablet Take 10 mg by mouth daily.   Marland Kitchen azelastine (ASTELIN) 0.1 % nasal spray Can use two sprays in each nostril twice daily if needed.  . cefdinir (OMNICEF) 300 MG capsule Take 1 capsule by mouth 2 (two) times daily.  . famotidine (PEPCID) 40 MG tablet TAKE 1 TABLET BY MOUTH EVERY EVENING IF NEEDED  . fluticasone (FLONASE) 50 MCG/ACT nasal spray Can use one spray in each nostril one to two times daily if needed.  . Fluticasone Furoate (ARNUITY ELLIPTA) 200 MCG/ACT AEPB Inhale 1 Dose into the lungs daily. Rinse, gargle, and spit after use.  Marland Kitchen glipiZIDE (GLUCOTROL XL) 10 MG 24 hr tablet Take 10 mg by mouth 2 (two) times daily.   Marland Kitchen JANUVIA 100 MG tablet Take 100 mg by mouth daily.   Marland Kitchen MELATONIN GUMMIES PO Take 1 tablet by mouth daily as needed.   . metFORMIN (GLUCOPHAGE) 500 MG tablet Take 1,000 mg by mouth 2 (two) times daily.   . montelukast (SINGULAIR) 10 MG tablet Take 1 tablet (10 mg total) by mouth daily.  Marland Kitchen omeprazole (PRILOSEC) 40 MG capsule TAKE 1 CAPSULE BY MOUTH EVERY DAY  . ONE TOUCH ULTRA TEST test strip USE AS DIRECTED TEST DAILY  . pioglitazone (ACTOS) 30 MG tablet TAKE 1 TABLET BY MOUTH ONCE DAILY FOR 90 DAYS  . predniSONE (DELTASONE) 10 MG tablet Take 4 tablets by mouth daily.  . [DISCONTINUED] hydrochlorothiazide (HYDRODIURIL) 25 MG tablet Take 25 mg by mouth daily.      Allergies:   Amoxicillin, Cinobac [cinoxacin], and Macrodantin [nitrofurantoin macrocrystal]   Social History   Socioeconomic History  . Marital status: Single    Spouse name: Not on file  . Number of  children: Not on file  . Years of education: Not on file  . Highest education level: Not on file  Occupational History  . Not on file  Social Needs  . Financial resource strain: Not on file  . Food insecurity    Worry: Not on file    Inability: Not on file  . Transportation needs    Medical: Not on file    Non-medical: Not on file  Tobacco Use  . Smoking status: Never Smoker  . Smokeless tobacco: Never Used  Substance and Sexual Activity  . Alcohol use: Never    Frequency: Never  . Drug use: Never  . Sexual activity: Not on file  Lifestyle  . Physical activity    Days per week: Not on file    Minutes per session: Not on file  .  Stress: Not on file  Relationships  . Social Herbalist on phone: Not on file    Gets together: Not on file    Attends religious service: Not on file    Active member of club or organization: Not on file    Attends meetings of clubs or organizations: Not on file    Relationship status: Not on file  Other Topics Concern  . Not on file  Social History Narrative  . Not on file     Family History: The patient's family history includes Bipolar disorder in her sister; Lung cancer in her brother; Prostate cancer in her father.  ROS:   Review of Systems  Constitution: Negative for chills, fever and malaise/fatigue.  Cardiovascular: Negative for chest pain, dyspnea on exertion, irregular heartbeat, leg swelling, near-syncope, orthopnea and palpitations.  Respiratory: Negative for cough, shortness of breath and wheezing.   Gastrointestinal: Negative for nausea and vomiting.  Neurological: Negative for dizziness, light-headedness and weakness.  Psychiatric/Behavioral: The patient is nervous/anxious.    Please see the history of present illness.     All other systems reviewed and are negative.  EKGs/Labs/Other Studies Reviewed:    The following studies were reviewed today:   EKG:  EKG 01/20/19 independently reviewed to be scanned to chart  shows SR with non specific ST/T wave changes (T wave inversion in V1 and V2) with poor R wave progression.    Recent Labs: Labs at her PCP 01/20/19 with Hb 14.6, GFR 58, creatinine 0.97, K 3.8, A1c 8.1, TSH 1.04, free T4 1.85 Total cholesterol 139, triglycerides 181, HDL 64, LDL 39.    Physical Exam:    VS:  BP (!) 154/78 (BP Location: Left Arm, Patient Position: Sitting, Cuff Size: Small)   Pulse 67   Ht 5\' 3"  (1.6 m)   Wt 120 lb 9.6 oz (54.7 kg)   SpO2 98%   BMI 21.36 kg/m     Wt Readings from Last 3 Encounters:  02/06/19 120 lb 9.6 oz (54.7 kg)  01/09/19 116 lb 3.2 oz (52.7 kg)  07/09/15 130 lb 1.1 oz (59 kg)     GEN:  Well nourished, well developed in no acute distress HEENT: Normal NECK: No JVD; No carotid bruits LYMPHATICS: No lymphadenopathy CARDIAC: RRR, no murmurs, rubs, gallops RESPIRATORY:  Clear to auscultation without rales, wheezing or rhonchi  ABDOMEN: Soft, non-tender, non-distended MUSCULOSKELETAL:  No edema; No deformity  SKIN: Warm and dry NEUROLOGIC:  Alert and oriented x 3 PSYCHIATRIC:  Normal affect   Signed, Holly More, MD  02/06/2019 9:03 AM    [place]

## 2019-02-06 ENCOUNTER — Other Ambulatory Visit: Payer: Self-pay

## 2019-02-06 ENCOUNTER — Ambulatory Visit (INDEPENDENT_AMBULATORY_CARE_PROVIDER_SITE_OTHER): Payer: Medicare Other | Admitting: Cardiology

## 2019-02-06 ENCOUNTER — Encounter: Payer: Self-pay | Admitting: Cardiology

## 2019-02-06 VITALS — BP 154/78 | HR 67 | Ht 63.0 in | Wt 120.6 lb

## 2019-02-06 DIAGNOSIS — E782 Mixed hyperlipidemia: Secondary | ICD-10-CM | POA: Diagnosis not present

## 2019-02-06 DIAGNOSIS — I10 Essential (primary) hypertension: Secondary | ICD-10-CM | POA: Diagnosis not present

## 2019-02-06 DIAGNOSIS — E119 Type 2 diabetes mellitus without complications: Secondary | ICD-10-CM | POA: Diagnosis not present

## 2019-02-06 MED ORDER — OLMESARTAN MEDOXOMIL-HCTZ 20-12.5 MG PO TABS: 1.0000 | ORAL_TABLET | Freq: Every day | ORAL | 1 refills | Status: DC

## 2019-02-06 NOTE — Patient Instructions (Signed)
Medication Instructions:  Your physician has recommended you make the following change in your medication:   STOP Hydrochlorothiazide 25mg    START Benicar (Olmesartan-HCTZ 20-12.5mg ) one tablet daily  If you need a refill on your cardiac medications before your next appointment, please call your pharmacy.   Lab work: None  If you have labs (blood work) drawn today and your tests are completely normal, you will receive your results only by: Marland Kitchen MyChart Message (if you have MyChart) OR . A paper copy in the mail If you have any lab test that is abnormal or we need to change your treatment, we will call you to review the results.  Testing/Procedures: Your physician has requested that you have an echocardiogram. Echocardiography is a painless test that uses sound waves to create images of your heart. It provides your doctor with information about the size and shape of your heart and how well your heart's chambers and valves are working. This procedure takes approximately one hour. There are no restrictions for this procedure.  Follow-Up: At [facility], you and your health needs are our priority.  As part of our continuing mission to provide you with exceptional heart care, we have created designated Provider Care Teams.  These Care Teams include your primary Cardiologist (physician) and Advanced Practice Providers (APPs -  Physician Assistants and Nurse Practitioners) who all work together to provide you with the care you need, when you need it. . You will need a follow up appointment in 2 months.    Any Other Special Instructions Will Be Listed Below (If Applicable).   Check your blood pressure daily and keep a log. Please bring to your next office visit.   We have changed your blood pressure medication. The new Benicar is a combination of a medication called an ARB and your previous hydrochlorothiazide. This helps control your blood pressure and protect your kidneys. It is a wonderful  medication for people who are diabetic.   We will send our note to Mr. Hassell Done and Dr. Meredith Pel and recommend stopping Actos as it can make your blood pressure worse. But wait for your appointment with them.

## 2019-02-11 ENCOUNTER — Telehealth: Payer: Self-pay | Admitting: Cardiology

## 2019-02-11 NOTE — Telephone Encounter (Signed)
Thinks the new medicine BJM started her on is causing palpitations and she can hear her heartbeat in her ears

## 2019-02-11 NOTE — Telephone Encounter (Signed)
Patient started olmesartan-hydrochlorothiazide 10-12.5 mg daily on 02/07/2019 after her office visit on 02/06/2019. She has been experiencing palpitations for the past 3 days. Patient also reports hearing her heart beating in her ears, ringing in her ears, and numbness in her left hand. All these symptoms come and go. Patient states her heart rate is normal 65-70 but over the weekend it was in the low 100's. Her blood pressure has been within normal limits. Her most recent vital signs this morning are as follows: BP: 125/66, HR: 80. Please advise.

## 2019-02-11 NOTE — Telephone Encounter (Signed)
2 good options versus a 3-day ZIO monitor and see me in the office afterwards the other is to work in a [place] can see her in the [employer] office and do an EKG please ask for which she prefers

## 2019-02-12 DIAGNOSIS — E119 Type 2 diabetes mellitus without complications: Secondary | ICD-10-CM | POA: Insufficient documentation

## 2019-02-12 DIAGNOSIS — E782 Mixed hyperlipidemia: Secondary | ICD-10-CM | POA: Insufficient documentation

## 2019-02-12 DIAGNOSIS — R002 Palpitations: Secondary | ICD-10-CM | POA: Insufficient documentation

## 2019-02-12 NOTE — Telephone Encounter (Signed)
Patient has been scheduled to Holly Montana, NP for for further evaluation tomorrow, 02/12/2019, at 8:15 am in the [facility] office. Patient provided with address and phone number for the [facility] location and advised to arrive 15 minutes early. Patient is agreeable and verbalized understanding. No further questions.

## 2019-02-12 NOTE — Telephone Encounter (Signed)
Called again.......

## 2019-02-12 NOTE — Progress Notes (Signed)
Office Visit    Patient Name: Holly Harper Date of Encounter: 02/13/2019  Primary Care Provider:  Sarajane Jews, FNP Primary Cardiologist:  Shirlee More, MD  Chief Complaint    73 yo female with PMH HTN, DM2, HLD presents today with chief complaint of palpitations.  Past Medical History    Past Medical History:  Diagnosis Date  . Allergic rhinitis   . Asthma   . Diabetes ([place])   . GERD (gastroesophageal reflux disease)   . Hiatal hernia   . Hypercholesterolemia   . Hypertension    Past Surgical History:  Procedure Laterality Date  . LASIK Bilateral     Allergies  Allergies  Allergen Reactions  . Amoxicillin Hives  . Cinobac [Cinoxacin]   . Macrodantin [Nitrofurantoin Macrocrystal] Hives    History of Present Illness    Holly Harper is a 73 y.o. female with a hx of HTN, HLD, DM2 last seen by Dr. Bettina Gavia 02/06/19. At her last office visit her anti-hypertensive therapy was changed to include Benicar due to elevated BP readings which she attributed to stress. EKG with nonspecific changes recommended for echocardiogram which is scheduled for 03/08/19. Of note, she was told to inquire about stopping Actos at her upcoming endocrinology consult. She presents today with chief complaint of palpitations.   Notices feeling of tremor inside her chest that feels like "fluttering" every couple days that lasts a few minutes and self resolves. No noted exacerbating nor relieving factors. Not associated with chest pain, nor shortness of breath.   She reports L wrist pain that has been ongoing for a few years, but seems worse over the last month. Worst in the morning, originates at the wrist and radiates up to the elbow. 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 We discussed possible etiology of carpal tunnel.   She reports hearing "cicadas in the distance" in her bilateral ears. Reports this is an ongoing problem and not new since medication changes.  Recently treated for ear infection by her PCP. She reports intermittently hearing her heartbeat in her bilateral ears, but not increased from previous. Tells me she wonders if this is just "getting old".  Checks BP daily with improvement in readings after initiation of Benicar. Tells me SBP have been 120s-130s. Reports her HR has increased from 65-70 to 90s-105. She stopped her Atenolol and HCTZ when she started Benicar as she hoped to be on one less medication. We discussed the affect of beta blockers on HR, palpitations, and anxiety. She reports she has felt a bit more anxious since stopping her Atenolol.  She denies chest pain, denies chest pressure, no SOB, no DOE, no edema.   EKGs/Labs/Other Studies Reviewed:   The following studies were reviewed today:  EKG:  EKG is  ordered today.  The ekg ordered today demonstrates SR rate 85 with T wave inversion in V5, V6. No acute ST/T wave changes.   Recent Labs:  01/20/19 at her PCP: Hb 14.6, GFR 58, creatinine 0.97, K 3.8, A1c 8.1, TSH 1.04, Free T4 1.85 Recent Lipid Panel 01/20/19 at her PCP: total cholesterol 139, triglycerides 181, HDL 64, LDL 39  Home Medications   Current Meds  Medication Sig  . albuterol (VENTOLIN HFA) 108 (90 Base) MCG/ACT inhaler USE 2 INHALATIONS ORALLY   EVERY 4 TO 6 HOURS AS      NEEDED FOR COUGH OR WHEEZE  . atorvastatin (LIPITOR) 10 MG tablet Take 10 mg by mouth daily.   Marland Kitchen  azelastine (ASTELIN) 0.1 % nasal spray Can use two sprays in each nostril twice daily if needed.  . famotidine (PEPCID) 40 MG tablet TAKE 1 TABLET BY MOUTH EVERY EVENING IF NEEDED  . fluticasone (FLONASE) 50 MCG/ACT nasal spray Can use one spray in each nostril one to two times daily if needed.  . Fluticasone Furoate (ARNUITY ELLIPTA) 200 MCG/ACT AEPB Inhale 1 Dose into the lungs daily. Rinse, gargle, and spit after use.  Marland Kitchen glipiZIDE (GLUCOTROL XL) 10 MG 24 hr tablet Take 10 mg by mouth 2 (two) times daily.   Marland Kitchen JANUVIA 100 MG tablet Take 100 mg by  mouth daily.   Marland Kitchen MELATONIN GUMMIES PO Take 1 tablet by mouth daily as needed.   . metFORMIN (GLUCOPHAGE) 500 MG tablet Take 1,000 mg by mouth 2 (two) times daily.   . montelukast (SINGULAIR) 10 MG tablet Take 1 tablet (10 mg total) by mouth daily.  Marland Kitchen olmesartan-hydrochlorothiazide (BENICAR HCT) 20-12.5 MG tablet Take 1 tablet by mouth daily.  Marland Kitchen omeprazole (PRILOSEC) 40 MG capsule TAKE 1 CAPSULE BY MOUTH EVERY DAY  . ONE TOUCH ULTRA TEST test strip USE AS DIRECTED TEST DAILY  . Turmeric (QC TUMERIC COMPLEX PO) Take by mouth.      Review of Systems       Review of Systems  Constitution: Negative for chills, fever and malaise/fatigue.  HENT: Positive for ear pain (left) and tinnitus.   Cardiovascular: Positive for palpitations. Negative for chest pain, dyspnea on exertion, leg swelling, near-syncope and orthopnea.  Respiratory: Negative for cough, shortness of breath and wheezing.   Musculoskeletal:       L wrist pain  Gastrointestinal: Negative for nausea and vomiting.  Neurological: Negative for dizziness, light-headedness and weakness.  Psychiatric/Behavioral: The patient is nervous/anxious.    All other systems reviewed and are otherwise negative except as noted above.  Physical Exam    VS:  BP 140/74 (BP Location: Left Arm, Patient Position: Sitting, Cuff Size: Normal)   Pulse 85   Ht 5\' 3"  (1.6 m)   Wt 116 lb (52.6 kg)   SpO2 99%   BMI 20.55 kg/m  , BMI Body mass index is 20.55 kg/m. GEN: Well nourished, well developed, in no acute distress. HEENT: normal. Neck: Supple, no JVD, carotid bruits, or masses. Cardiac: RRR, no murmurs, rubs, or gallops. No clubbing, cyanosis, edema.  Radials/DP/PT 2+ and equal bilaterally.  Respiratory:  Respirations regular and unlabored, clear to auscultation bilaterally. GI: Soft, nontender, nondistended, BS + x 4. MS: No deformity or atrophy. Skin: Warm and dry, no rash. Neuro:  Strength and sensation are intact. Psych: Normal affect.   Accessory Clinical Findings    ECG personally reviewed by me today - SR rate 85 with t wave inversion in V5, V6 - no acute changes.  Assessment & Plan    1. Palpitations - Reports intermittent sensation of "tremors" in her chest and palpitations. Reports feels like fluttering, lasts a few minutes, occurs every couple days. She discontinued her Atenolol after visit 02/06/19 when blood pressure medications were changed. Heart rate has increased from 65-70 to 90s-105 at home. She has also noticed her anxiety increasing after stopping Atenolol. EKG today with SR rate 85, no PVC, PAC.  Restart Atenolol 50mg  daily as this was previous dose.   BMP, TSH, T4 today.   If palpitations do not improve after 5-7 days of Atenolol she will call our office and plan for Zio monitor to be mailed to her home.  2.  HTN - Blood pressure control improved after adjustment from HCTZ to Olmesartan-HCTZ. She will continue to check BP daily and keep a log.   3. HLD - Lipid profile 01/20/19 with LDL 39. Continue Atorvastatin.   4. DM2 - Upcoming appointment to establish with endocrinology Dr. Meredith Pel. Recommended she discuss discontinuing Actos with Dr. Meredith Pel due to risk of edema, heart failure. Would recommend SGLT2 inhibitor, Victoza, or Trulicity for cardiac benefit.  5. EKG nonspecific changes - Previous EKG with t-wave inversion in V1, V2 per previous note. EKG today with t-wave inversion in lead 5, 6. No chest pain, pressure, tightness, no DOE. Plan for echo as previously ordered. No indication for ischemic evaluation at this time.  6. Left wrist pain - Reports L wrist pain on awakening in the morning that can radiate up to her elbow. Has happened for multiple years, but worse over last month - she previously worked as an Optometrist doing lots of typing. Consider etiology carpal tunnel. Recommended follow with PCP and try wrist brace at night.   7. Tinnitus - Reports sound of "cicadas" in her bilateral ears. Recently  underwent treatment for ear infection by her PCP. Recommended she follow with him.  Disposition: Echocardiogram and Dr. Bettina Gavia in 2 months as previously planned. She will call if symptoms do not improve after resumption of Atenolol.  Loel Dubonnet, NP 02/13/2019, 9:14 AM

## 2019-02-13 ENCOUNTER — Ambulatory Visit (INDEPENDENT_AMBULATORY_CARE_PROVIDER_SITE_OTHER): Payer: Medicare Other | Admitting: Family

## 2019-02-13 ENCOUNTER — Other Ambulatory Visit: Payer: Self-pay

## 2019-02-13 ENCOUNTER — Encounter: Payer: Self-pay | Admitting: Family

## 2019-02-13 VITALS — BP 140/74 | HR 85 | Ht 63.0 in | Wt 116.0 lb

## 2019-02-13 DIAGNOSIS — E782 Mixed hyperlipidemia: Secondary | ICD-10-CM

## 2019-02-13 DIAGNOSIS — R002 Palpitations: Secondary | ICD-10-CM | POA: Diagnosis not present

## 2019-02-13 DIAGNOSIS — E119 Type 2 diabetes mellitus without complications: Secondary | ICD-10-CM

## 2019-02-13 DIAGNOSIS — I10 Essential (primary) hypertension: Secondary | ICD-10-CM | POA: Diagnosis not present

## 2019-02-13 DIAGNOSIS — H9313 Tinnitus, bilateral: Secondary | ICD-10-CM

## 2019-02-13 DIAGNOSIS — M25532 Pain in left wrist: Secondary | ICD-10-CM

## 2019-02-13 MED ORDER — ATENOLOL 50 MG PO TABS: 50.0000 mg | ORAL_TABLET | Freq: Every day | ORAL | 0 refills | Status: DC

## 2019-02-13 NOTE — Patient Instructions (Signed)
Medication Instructions:  Your physician has recommended you make the following change in your medication:   START Atenolol 50mg  (one tablet) daily in the morning  If you need a refill on your cardiac medications before your next appointment, please call your pharmacy.   Lab work: Your physician recommends that you return for lab work today: BMET, TSH, Free T4  These will check your kidneys, electrolytes, and thyroid  If you have labs (blood work) drawn today and your tests are completely normal, you will receive your results only by: Marland Kitchen MyChart Message (if you have MyChart) OR . A paper copy in the mail If you have any lab test that is abnormal or we need to change your treatment, we will call you to review the results.  Testing/Procedures: You had an EKG today.  If palpitations and "fluttering" sensation do not improve, please let us know and we will order a Zio monitor for you. This is a heart monitor that is mailed to your home.   Follow-Up: At [facility], you and your health needs are our priority.  As part of our continuing mission to provide you with exceptional heart care, we have created designated Provider Care Teams.  These Care Teams include your primary Cardiologist (physician) and Advanced Practice Providers (APPs -  Physician Assistants and Nurse Practitioners) who all work together to provide you with the care you need, when you need it.  Any Other Special Instructions Will Be Listed Below (If Applicable).  Please continue to check your blood pressure and heart rate daily and keep a log. If you consistently notice systolic blood pressures (the top number) >140 please let us know.   Would recommend a wrist guard at night to see if it alleviates wrist pain in the morning.   Would recommend you follow up with your PCP regarding your ears.  Medications and Palpitations 1. Avoid all over-the-counter antihistamines except Claritin/Loratadine and Zyrtec/Cetrizine. 2.  Avoid all combination including cold sinus allergies flu decongestant and sleep medications 3. You can use Robitussin DM Mucinex and Mucinex DM for cough. 4. can use Tylenol aspirin ibuprofen and naproxen but no combinations such as sleep or sinus.

## 2019-02-14 ENCOUNTER — Telehealth: Payer: Self-pay | Admitting: Family

## 2019-02-14 LAB — BASIC METABOLIC PANEL
BUN/Creatinine Ratio: 14 (ref 12–28)
BUN: 10 mg/dL (ref 8–27)
CO2: 26 mmol/L (ref 20–29)
Calcium: 9.9 mg/dL (ref 8.7–10.3)
Chloride: 96 mmol/L (ref 96–106)
Creatinine, Ser: 0.7 mg/dL (ref 0.57–1.00)
GFR calc Af Amer: 99 mL/min/{1.73_m2} (ref >59)
GFR calc non Af Amer: 86 mL/min/{1.73_m2} (ref >59)
Glucose: 164 mg/dL — ABNORMAL HIGH (ref 65–99)
Potassium: 4.6 mmol/L (ref 3.5–5.2)
Sodium: 137 mmol/L (ref 134–144)

## 2019-02-14 LAB — TSH+FREE T4
Free T4: 1.99 ng/dL — ABNORMAL HIGH (ref 0.82–1.77)
TSH: 0.874 u[IU]/mL (ref 0.450–4.500)

## 2019-02-14 NOTE — Telephone Encounter (Signed)
Called Mrs. Derossett and left voicemail per DPR on file. Informed of lab results including normal electrolytes, normal kidney function, stable thyroid numbers compared to previous. No changes to medication regimen or treatment plan at this time.   Forwarded lab results to her PCP via Epic fax function.   Loel Dubonnet, NP

## 2019-02-20 ENCOUNTER — Encounter: Payer: Self-pay | Admitting: Allergy and Immunology

## 2019-02-20 ENCOUNTER — Ambulatory Visit (INDEPENDENT_AMBULATORY_CARE_PROVIDER_SITE_OTHER): Payer: Medicare Other | Admitting: Allergy and Immunology

## 2019-02-20 ENCOUNTER — Ambulatory Visit: Payer: Medicare Other | Admitting: Allergy and Immunology

## 2019-02-20 ENCOUNTER — Other Ambulatory Visit: Payer: Self-pay

## 2019-02-20 VITALS — BP 126/70 | HR 67 | Temp 98.4°F | Resp 16

## 2019-02-20 DIAGNOSIS — J3089 Other allergic rhinitis: Secondary | ICD-10-CM

## 2019-02-20 DIAGNOSIS — J4530 Mild persistent asthma, uncomplicated: Secondary | ICD-10-CM | POA: Diagnosis not present

## 2019-02-20 DIAGNOSIS — K219 Gastro-esophageal reflux disease without esophagitis: Secondary | ICD-10-CM

## 2019-02-20 DIAGNOSIS — K224 Dyskinesia of esophagus: Secondary | ICD-10-CM

## 2019-02-20 MED ORDER — NYSTATIN 100000 UNIT/ML MT SUSP: OROMUCOSAL | 0 refills | Status: DC

## 2019-02-20 NOTE — Progress Notes (Signed)
[place]   Follow-up Note  Referring Provider: Sarajane Jews, FNP Primary Provider: Sarajane Jews, FNP Date of Office Visit: 02/20/2019  Subjective:   Holly Harper (DOB: 1946/01/23) is a 73 y.o. female who returns to the [facility] on 02/20/2019 in re-evaluation of the following:  HPI: Celes returns to this clinic in reevaluation of asthma and allergic rhinitis and LPR.  I last saw her in this clinic on 09 January 2019.  Several issues have developed recently.  First, she had to go to [facility] urgent care for a really bad sore throat about 2 weeks ago.  Her sore throat felt raw and sore.  As well, she has been having some difficulty swallowing.  She is also been belching a lot especially at nighttime.  In addition, she still continues to have her chronic cough and some chronic throat clearing.  She has been consistently treating her airway with anti-inflammatory agents including a leukotriene modifier and Arnuity about 3 times a week and she has been treating her reflux with omeprazole and famotidine consistently.  She started a new blood pressure medicine about 3 weeks ago.  She has been using ARB plus hydrochlorothiazide in addition to her atenolol.  She thinks that she has developed significant insomnia since that medication change.  Allergies as of 02/20/2019      Reactions   Amoxicillin Hives   Cinobac [cinoxacin]    Macrodantin [nitrofurantoin Macrocrystal] Hives      Medication List      albuterol 108 (90 Base) MCG/ACT inhaler Commonly known as: VENTOLIN HFA USE 2 INHALATIONS ORALLY   EVERY 4 TO 6 HOURS AS      NEEDED FOR COUGH OR WHEEZE   atenolol 50 MG tablet Commonly known as: TENORMIN Take 1 tablet (50 mg total) by mouth daily.   atorvastatin 10 MG tablet Commonly known as: LIPITOR Take 10 mg by mouth daily.   azelastine 0.1 % nasal spray Commonly known as: ASTELIN Can use two sprays in each  nostril twice daily if needed.   famotidine 40 MG tablet Commonly known as: PEPCID TAKE 1 TABLET BY MOUTH EVERY EVENING IF NEEDED   fluticasone 50 MCG/ACT nasal spray Commonly known as: FLONASE Can use one spray in each nostril one to two times daily if needed.   Fluticasone Furoate 200 MCG/ACT Aepb Commonly known as: Arnuity Ellipta Inhale 1 Dose into the lungs daily. Rinse, gargle, and spit after use.   glipiZIDE 10 MG 24 hr tablet Commonly known as: GLUCOTROL XL Take 10 mg by mouth 2 (two) times daily.   Januvia 100 MG tablet Generic drug: sitaGLIPtin Take 100 mg by mouth daily.   MELATONIN GUMMIES PO Take 1 tablet by mouth daily as needed.   metFORMIN 500 MG tablet Commonly known as: GLUCOPHAGE Take 1,000 mg by mouth 2 (two) times daily.   montelukast 10 MG tablet Commonly known as: SINGULAIR Take 1 tablet (10 mg total) by mouth daily.   olmesartan-hydrochlorothiazide 20-12.5 MG tablet Commonly known as: BENICAR HCT Take 1 tablet by mouth daily.   omeprazole 40 MG capsule Commonly known as: PRILOSEC TAKE 1 CAPSULE BY MOUTH EVERY DAY   ONE TOUCH ULTRA TEST test strip Generic drug: glucose blood USE AS DIRECTED TEST DAILY   QC TUMERIC COMPLEX PO Take by mouth.       Past Medical History:  Diagnosis Date  . Allergic rhinitis   . Asthma   .  Diabetes ([place])   . GERD (gastroesophageal reflux disease)   . Hiatal hernia   . Hypercholesterolemia   . Hypertension     Past Surgical History:  Procedure Laterality Date  . LASIK Bilateral     Review of systems negative except as noted in HPI / PMHx or noted below:  Review of Systems  Constitutional: Negative.   HENT: Negative.   Eyes: Negative.   Respiratory: Negative.   Cardiovascular: Negative.   Gastrointestinal: Negative.   Genitourinary: Negative.   Musculoskeletal: Negative.   Skin: Negative.   Neurological: Negative.   Endo/Heme/Allergies: Negative.   Psychiatric/Behavioral: Negative.       Objective:   Vitals:   02/20/19 1614  BP: 126/70  Pulse: 67  Resp: 16  Temp: 98.4 F (36.9 C)  SpO2: 97%          Physical Exam Constitutional:      Appearance: She is not diaphoretic.     Comments: Raspy voice  HENT:     Head: Normocephalic.     Right Ear: Tympanic membrane, ear canal and external ear normal.     Left Ear: Tympanic membrane, ear canal and external ear normal.     Nose: Nose normal. No mucosal edema or rhinorrhea.     Mouth/Throat:     Pharynx: Uvula midline. No oropharyngeal exudate.  Eyes:     Conjunctiva/sclera: Conjunctivae normal.  Neck:     Thyroid: No thyromegaly.     Trachea: Trachea normal. No tracheal tenderness or tracheal deviation.  Cardiovascular:     Rate and Rhythm: Normal rate and regular rhythm.     Heart sounds: Normal heart sounds, S1 normal and S2 normal. No murmur.  Pulmonary:     Effort: No respiratory distress.     Breath sounds: Normal breath sounds. No stridor. No wheezing or rales.  Lymphadenopathy:     Head:     Right side of head: No tonsillar adenopathy.     Left side of head: No tonsillar adenopathy.     Cervical: No cervical adenopathy.  Skin:    Findings: No erythema or rash.     Nails: There is no clubbing.   Neurological:     Mental Status: She is alert.     Diagnostics:    Spirometry was performed and demonstrated an FEV1 of 1.59 at 76 % of predicted.   Assessment and Plan:   1. Asthma, well controlled, mild persistent   2. Other allergic rhinitis   3. LPRD (laryngopharyngeal reflux disease)   4. Esophageal dysmotility     1. Continue montelukast 10 mg daily  2. Continue Arnuity 200 one inhalation 3-7 times per week depending on disease activity  3. Continue omeprazole 40 mg daily plus famotidine 40 mg in evening   4.  Continue Flonase 1 spray each nostril 1-2 times per day during periods of upper airway symptoms  5. Continue nasal azelastine 2 sprays each nostril twice a day if needed  6.  Continue ProAir HFA 2 puffs every 4-6 hours if needed  7. Treat possible fungus overgrowth with Nystatin oral - 5 mls 3 times a day for 10 days  8. Revisit with Dr. Lyndel Safe for swallowing problem and belching  9. Revisit with ENT for swallowing problems and throat pain  10. Side effect of new blood pressure medication? Insomnia? Belching? Throat pain?  11.  Obtain chest x-ray  12. Return to clinic in 12 weeks or earlier if problem  13. Obtain fall flu vaccine (  and COVID vaccine)  It is quite possible that Saysha has developed some side effects from the use of her medications.  She may have developed some fungal overgrowth in her throat which we will treat with nystatin as a result of Arnuity use.  She may have developed problems with her new blood pressure medicine including insomnia and her belching and maybe even her throat pain.  It has been quite a while since she has visited with her gastroenterologist and ear nose and throat doctor and we will have her revisit for an upper endoscopy and rhinoscopy in investigation of her complaints.  As well, I will obtain a chest x-ray to make sure were not dealing with a mediastinal or lung parenchymal problem.  She will continue at this point in time on a collection of anti-inflammatory agents for her airway and therapy directed against reflux.  I will see her back in this clinic in 12 weeks or earlier if there is a problem.  [name], MD Allergy / Immunology [place]

## 2019-02-20 NOTE — Patient Instructions (Addendum)
  1. Continue montelukast 10 mg daily  2. Continue Arnuity 200 one inhalation 3-7 times per week depending on disease activity  3. Continue omeprazole 40 mg daily plus famotidine 40 mg in evening   4.  Continue Flonase 1 spray each nostril 1-2 times per day during periods of upper airway symptoms  5. Continue nasal azelastine 2 sprays each nostril twice a day if needed  6. Continue ProAir HFA 2 puffs every 4-6 hours if needed  7. Treat possible fungus overgrowth with Nystatin oral - 5 mls 3 times a day for 10 days  8. Revisit with Dr. Lyndel Safe for swallowing problem and belching  9. Revisit with ENT for swallowing problems and throat pain  10. Side effect of new blood pressure medication? Insomnia? Belching? Throat pain?  11.  Obtain chest x-ray  12. Return to clinic in 12 weeks or earlier if problem  13. Obtain fall flu vaccine (and COVID vaccine)

## 2019-02-21 ENCOUNTER — Encounter: Payer: Self-pay | Admitting: Allergy and Immunology

## 2019-02-27 ENCOUNTER — Telehealth: Payer: Self-pay | Admitting: *Deleted

## 2019-02-27 NOTE — Telephone Encounter (Signed)
Informed Holly Harper about her chest x-ray results. Per Dr. Neldon Mc, Holly Harper should obtain a non-contrast Chest CT scan. She states that her husband is currently in ICU and now is not a good time and she will call us when she is able to have the scan.

## 2019-03-08 ENCOUNTER — Other Ambulatory Visit: Payer: Self-pay

## 2019-03-08 ENCOUNTER — Ambulatory Visit (INDEPENDENT_AMBULATORY_CARE_PROVIDER_SITE_OTHER): Payer: Medicare Other

## 2019-03-08 DIAGNOSIS — I10 Essential (primary) hypertension: Secondary | ICD-10-CM | POA: Diagnosis not present

## 2019-03-08 NOTE — Progress Notes (Signed)
Complete echocardiogram has been performed.  Jimmy Tyronda Vizcarrondo RDCS, RVT 

## 2019-03-11 ENCOUNTER — Telehealth: Payer: Self-pay | Admitting: *Deleted

## 2019-03-11 MED ORDER — AZELASTINE HCL 0.1 % NA SOLN: NASAL | 1 refills | Status: DC

## 2019-03-11 MED ORDER — ARNUITY ELLIPTA 200 MCG/ACT IN AEPB: 1.0000 | INHALATION_SPRAY | Freq: Every day | RESPIRATORY_TRACT | 1 refills | Status: DC

## 2019-03-11 NOTE — Telephone Encounter (Signed)
Patient needs refills for arnuity ellipta and azelastine nasal spray called into [facility].

## 2019-03-11 NOTE — Telephone Encounter (Signed)
Refills sent to requested pharmacy. 

## 2019-03-14 ENCOUNTER — Encounter: Payer: Self-pay | Admitting: Allergy and Immunology

## 2019-03-18 ENCOUNTER — Other Ambulatory Visit: Payer: Self-pay

## 2019-03-18 ENCOUNTER — Ambulatory Visit (INDEPENDENT_AMBULATORY_CARE_PROVIDER_SITE_OTHER): Payer: Medicare Other | Admitting: Gastroenterology

## 2019-03-18 ENCOUNTER — Encounter: Payer: Self-pay | Admitting: Gastroenterology

## 2019-03-18 VITALS — BP 134/76 | HR 70 | Temp 97.8°F | Ht 63.0 in | Wt 117.4 lb

## 2019-03-18 DIAGNOSIS — R1310 Dysphagia, unspecified: Secondary | ICD-10-CM

## 2019-03-18 DIAGNOSIS — K219 Gastro-esophageal reflux disease without esophagitis: Secondary | ICD-10-CM

## 2019-03-18 DIAGNOSIS — R1319 Other dysphagia: Secondary | ICD-10-CM

## 2019-03-18 MED ORDER — FAMOTIDINE 40 MG PO TABS: 40.0000 mg | ORAL_TABLET | Freq: Every day | ORAL | 11 refills | Status: DC

## 2019-03-18 MED ORDER — OMEPRAZOLE 40 MG PO CPDR: DELAYED_RELEASE_CAPSULE | ORAL | 11 refills | Status: DC

## 2019-03-18 NOTE — Patient Instructions (Signed)
If you are age 73 or older, your body mass index should be between 23-30. Your Body mass index is 20.79 kg/m. If this is out of the aforementioned range listed, please consider follow up with your Primary Care Provider.  If you are age 43 or younger, your body mass index should be between 19-25. Your Body mass index is 20.79 kg/m. If this is out of the aformentioned range listed, please consider follow up with your Primary Care Provider.   You have been scheduled for an endoscopy. Please follow written instructions given to you at your visit today. If you use inhalers (even only as needed), please bring them with you on the day of your procedure. Your physician has requested that you go to www.startemmi.com and enter the access code given to you at your visit today. This web site gives a general overview about your procedure. However, you should still follow specific instructions given to you by our office regarding your preparation for the procedure.  We have sent the following medications to your pharmacy for you to pick up at your convenience: Omeprazole Pepcid  Thank you,  Dr. Jackquline Denmark

## 2019-03-18 NOTE — Progress Notes (Signed)
Chief Complaint:   Referring Provider:  Sarajane Jews, FNP      ASSESSMENT AND PLAN;   #1. GERD  #2. Esophageal dysphagia.  H/O weight loss is concerning. D/d includes eso stricture, Schatzki's ring, motility disorder, eosinophilic esophagitis, pill induced esophagitis, r/o esophageal Ca or extrinsic lesions.   Plan: - EGD with dil.  I have discussed risks and benefits.  This will be scheduled in coming days. - Omeprazole 40mg  po qAM - Pepcid 40mg  po qhs to continue for now. - I have instructed patient that she needs to chew foods especially meats and breads well and eat slowly. - Agree with ENT evaluation as well as planned by Dr. Neldon Mc.    HPI:    Holly Harper is a 73 y.o. female  Dx with pulm nodule, awaiting PET scan Has been having problems swallowing-mostly solids, especially when she takes several pills together, mid chest without any regurgitation, worse over the last 6 to 8 weeks.  She was seen by Dr. Neldon Mc and started on Pepcid 40 mg p.o. nightly in addition to omeprazole in the morning.  No heartburn.  She has been advised to get repeat EGD with dilatation.  Has lost weight from 133 pounds 12/2015 to 117 pounds today.  She lost most of the weight in 2017/2018.  Currently the weight is stable.  She has been under considerable stress as her husband is in [place].  Has been seen by endocrine-metformin has been reduced.  Denies having any significant diarrhea or constipation.  No melena or hematochezia.  Continues to have chronic cough and chronic throat clearing.  Past GI procedures: -Colonoscopy 01/2016 ([facility])-mild pancolonic diverticulosis, otherwise normal colonoscopy.  Highly redundant colon. -EGD 08/2011-presbyesophagus S/P dilatation 50 AFR, minimal hiatal hernia, mild gastritis.  Neg eso Bx for EoE.  Gastric biopsies did show chronic gastritis with focal intestinal metaplasia. Past Medical History:  Diagnosis Date  . Allergic rhinitis   . Asthma   .  Diabetes ([place])   . Dysphagia   . GERD (gastroesophageal reflux disease)   . Hiatal hernia   . Hypercholesterolemia   . Hypertension   . Paresthesia     Past Surgical History:  Procedure Laterality Date  . COLONOSCOPY  02/10/2016   Mild pancolonic diverticulosis. Otherwise, normal colonoscopy  . ESOPHAGOGASTRODUODENOSCOPY  08/24/2011   Prebyesophagus. Mininal hiatal hernia. Mild gastritis  . LASIK Bilateral     Family History  Problem Relation Age of Onset  . Prostate cancer Father   . Bipolar disorder Sister   . Lung cancer Brother        Smoker  . Colon cancer Neg Hx   . Esophageal cancer Neg Hx     Social History   Tobacco Use  . Smoking status: Never Smoker  . Smokeless tobacco: Never Used  Substance Use Topics  . Alcohol use: Not Currently    Frequency: Never    Comment: quit drinking years ago   . Drug use: Not Currently    Current Outpatient Medications  Medication Sig Dispense Refill  . albuterol (VENTOLIN HFA) 108 (90 Base) MCG/ACT inhaler USE 2 INHALATIONS ORALLY   EVERY 4 TO 6 HOURS AS      NEEDED FOR COUGH OR WHEEZE 51 g 0  . AMBULATORY NON FORMULARY MEDICATION 1 capsule daily as needed. CBD capsule-300mg  capsule    . atenolol (TENORMIN) 50 MG tablet Take 1 tablet (50 mg total) by mouth daily. 90 tablet 0  . atorvastatin (LIPITOR) 10 MG tablet Take  10 mg by mouth daily.     Marland Kitchen azelastine (ASTELIN) 0.1 % nasal spray Can use two sprays in each nostril twice daily if needed. 90 mL 1  . famotidine (PEPCID) 40 MG tablet TAKE 1 TABLET BY MOUTH EVERY EVENING IF NEEDED 90 tablet 0  . fluticasone (FLONASE) 50 MCG/ACT nasal spray Can use one spray in each nostril one to two times daily if needed. 48 g 1  . Fluticasone Furoate (ARNUITY ELLIPTA) 200 MCG/ACT AEPB Inhale 1 Dose into the lungs daily. Rinse, gargle, and spit after use. 90 each 1  . glipiZIDE (GLUCOTROL) 5 MG tablet Take 5 mg by mouth daily.    Marland Kitchen JANUVIA 100 MG tablet Take 100 mg by mouth daily.     Marland Kitchen  MELATONIN GUMMIES PO Take 2 tablets by mouth daily as needed. Take 2 (5 mg)  gummies for a total of 10mg     . metFORMIN (GLUCOPHAGE) 500 MG tablet Take 1,000 mg by mouth 2 (two) times daily.     . montelukast (SINGULAIR) 10 MG tablet Take 1 tablet (10 mg total) by mouth daily. 90 tablet 1  . olmesartan-hydrochlorothiazide (BENICAR HCT) 20-12.5 MG tablet Take 1 tablet by mouth daily. 90 tablet 1  . omeprazole (PRILOSEC) 40 MG capsule TAKE 1 CAPSULE BY MOUTH EVERY DAY 90 capsule 1  . ONE TOUCH ULTRA TEST test strip USE AS DIRECTED TEST DAILY  11  . Turmeric (QC TUMERIC COMPLEX PO) Take 1 tablet by mouth daily as needed.      No current facility-administered medications for this visit.     Allergies  Allergen Reactions  . Amoxicillin Hives  . Cinobac [Cinoxacin]   . Macrodantin [Nitrofurantoin Macrocrystal] Hives    Review of Systems:  Constitutional: Denies fever, chills, diaphoresis, appetite change and has fatigue.  HEENT: Has multiple allergies being followed by Dr. Neldon Mc Respiratory: Denies SOB, DOE, cough, chest tightness,  and wheezing.   Cardiovascular: Denies chest pain, palpitations and leg swelling.  Genitourinary: Denies dysuria, urgency, frequency, hematuria, flank pain and difficulty urinating.  Musculoskeletal: Denies myalgias, back pain, joint swelling, arthralgias and gait problem.  Skin: No rash.  Neurological: Denies dizziness, seizures, syncope, weakness, light-headedness, numbness and headaches.  Hematological: Denies adenopathy. Easy bruising, personal or family bleeding history  Psychiatric/Behavioral: has anxiety or depression     Physical Exam:    BP 134/76   Pulse 70   Temp 97.8 F (36.6 C)   Ht 5\' 3"  (1.6 m)   Wt 117 lb 6 oz (53.2 kg)   BMI 20.79 kg/m  Filed Weights   03/18/19 1557  Weight: 117 lb 6 oz (53.2 kg)   Constitutional:  Well-developed, in no acute distress. Psychiatric: Normal mood and affect. Behavior is normal. HEENT: Pupils  normal.  Conjunctivae are normal. No scleral icterus. Neck supple.  Cardiovascular: Normal rate, regular rhythm. No edema Pulmonary/chest: Effort normal and breath sounds normal. No wheezing, rales or rhonchi. Abdominal: Soft, nondistended. Nontender. Bowel sounds active throughout. There are no masses palpable. No hepatomegaly. Rectal:  defered Neurological: Alert and oriented to person place and time. Skin: Skin is warm and dry. No rashes noted.  Data Reviewed: I have personally reviewed following labs and imaging studies  CBC: No flowsheet data found.  CMP: CMP Latest Ref Rng & Units 02/13/2019  Glucose 65 - 99 mg/dL 164(H)  BUN 8 - 27 mg/dL 10  Creatinine 0.57 - 1.00 mg/dL 0.70  Sodium 134 - 144 mmol/L 137  Potassium 3.5 - 5.2  mmol/L 4.6  Chloride 96 - 106 mmol/L 96  CO2 20 - 29 mmol/L 26  Calcium 8.7 - 10.3 mg/dL 9.9    GFR: CrCl cannot be calculated (Patient's most recent lab result is older than the maximum 21 days allowed.). Liver Function Tests: No results for input(s): AST, ALT, ALKPHOS, BILITOT, PROT, ALBUMIN in the last 168 hours. No results for input(s): LIPASE, AMYLASE in the last 168 hours. No results for input(s): AMMONIA in the last 168 hours. Coagulation Profile: No results for input(s): INR, PROTIME in the last 168 hours. HbA1C: No results for input(s): HGBA1C in the last 72 hours. Lipid Profile: No results for input(s): CHOL, HDL, LDLCALC, TRIG, CHOLHDL, LDLDIRECT in the last 72 hours. Thyroid Function Tests: No results for input(s): TSH, T4TOTAL, FREET4, T3FREE, THYROIDAB in the last 72 hours. Anemia Panel: No results for input(s): VITAMINB12, FOLATE, FERRITIN, TIBC, IRON, RETICCTPCT in the last 72 hours.  No results found for this or any previous visit (from the past 240 hour(s)).    Radiology Studies: No results found.    Carmell Austria, MD 03/18/2019, 4:13 PM  Cc: Sarajane Jews, FNP

## 2019-03-19 ENCOUNTER — Telehealth: Payer: Self-pay | Admitting: *Deleted

## 2019-03-19 NOTE — Telephone Encounter (Signed)
Scheduled Holly Harper's scans per Dr. Bruna Potter orders.  PET Scan: October 12th arriving at 7:00am for a 7:30 appt. NPO 4 hrs prior, water and regular medications O.K. No exercise prior to and arrive well rested.   Thyroid Ultrasound: October 15th arriving at 3:00pm for a 3:30 appt.   Holly Harper informed of appt times and instructions.

## 2019-03-22 ENCOUNTER — Encounter: Payer: Medicare Other | Admitting: Gastroenterology

## 2019-04-05 ENCOUNTER — Other Ambulatory Visit: Payer: Self-pay | Admitting: *Deleted

## 2019-04-05 ENCOUNTER — Encounter: Payer: Self-pay | Admitting: *Deleted

## 2019-04-05 DIAGNOSIS — R9389 Abnormal findings on diagnostic imaging of other specified body structures: Secondary | ICD-10-CM

## 2019-04-05 DIAGNOSIS — R911 Solitary pulmonary nodule: Secondary | ICD-10-CM

## 2019-04-05 DIAGNOSIS — R942 Abnormal results of pulmonary function studies: Secondary | ICD-10-CM

## 2019-04-12 ENCOUNTER — Encounter: Payer: Medicare Other | Admitting: Gastroenterology

## 2019-04-15 ENCOUNTER — Encounter: Payer: Self-pay | Admitting: Internal Medicine

## 2019-04-15 ENCOUNTER — Ambulatory Visit (INDEPENDENT_AMBULATORY_CARE_PROVIDER_SITE_OTHER): Payer: Medicare Other | Admitting: Internal Medicine

## 2019-04-15 ENCOUNTER — Other Ambulatory Visit: Payer: Self-pay

## 2019-04-15 DIAGNOSIS — R058 Other specified cough: Secondary | ICD-10-CM | POA: Insufficient documentation

## 2019-04-15 DIAGNOSIS — R911 Solitary pulmonary nodule: Secondary | ICD-10-CM | POA: Diagnosis not present

## 2019-04-15 DIAGNOSIS — R05 Cough: Secondary | ICD-10-CM | POA: Diagnosis not present

## 2019-04-15 NOTE — Patient Instructions (Addendum)
Try off  arnuity to see if helps the cough - if worse restart  Prilosec 40 mg Take 30-60 min before first meal of the day   GERD (REFLUX)  is an extremely common cause of respiratory symptoms just like yours , many times with no obvious heartburn at all.    It can be treated with medication, but also with lifestyle changes including elevation of the head of your bed (ideally with 6 -8inch blocks under the headboard of your bed),  Smoking cessation, avoidance of late meals, excessive alcohol, and avoid fatty foods, chocolate, peppermint, colas, red wine, and acidic juices such as orange juice.  NO MINT OR MENTHOL PRODUCTS SO NO COUGH DROPS  USE SUGARLESS CANDY INSTEAD (Jolley ranchers or Stover's or Life Savers) or even ice chips will also do - the key is to swallow to prevent all throat clearing. NO OIL BASED VITAMINS - use powdered substitutes.  Avoid fish oil when coughing.  We will set you up with Dr Roxan Hockey asap

## 2019-04-15 NOTE — Assessment & Plan Note (Signed)
Never smoker, min exp -  Chest CT  11/03/2018 SPN R Apex  .24 x 1.7 x 1.8  Spiculated - PET 04/01/19   SPN  POS  4.5 suv, no nodes    This is curable bronchogenic ca until proven o/w in a good operative candidate therefore rec proceed to excisional bx/ rulobectomy > referred to T surgery  Discussed in detail all the  indications, usual  risks and alternatives  relative to the benefits with patient who agrees to proceed with w/u as outlined.

## 2019-04-15 NOTE — Progress Notes (Signed)
Tanasia Budzinski, female    DOB: 07-Jan-1946     MRN: [id number]   Brief patient profile:  79 yowf never smoker but some exposure and brother who smoked died of lung ca referred to pulmonary clinic 04/15/2019 by Dr   Neldon Mc for abn ct/ pet.      History of Present Illness  04/15/2019  Pulmonary/ 1st office eval/[facility]  Chief Complaint  Patient presents with  . Pulmonary Consult    Referred by Dr Neldon Mc for eval of abnormal PET scan.   st daily x 12 years with documented gerd / [place] on dgEs 09/04/2006.  Dyspnea:  Not limited by breathing from desired activities   Cough: wakes up nl hour  with cough x at least 2-3 years never prematurely ,  White mucus onlyx sev tsp  Sleep: on back flat bed two pillows no resp disturbance SABA use: none   No obvious day to day or daytime variability or assoc purulent sputum or mucus plugs or hemoptysis or cp or chest tightness, subjective wheeze or overt sinus or hb symptoms.   sleeping without nocturnal   exacerbation  of respiratory  c/o's or need for noct saba. Also denies any obvious fluctuation of symptoms with weather or environmental changes or other aggravating or alleviating factors except as outlined above   No unusual exposure hx or h/o childhood pna/ asthma or knowledge of premature birth.  Current Allergies, Complete Past Medical History, Past Surgical History, Family History, and Social History were reviewed in [employer] record.  ROS  The following are not active complaints unless bolded Hoarseness, sore throat, dysphagia, dental problems, itching, sneezing,  nasal congestion or discharge of excess mucus or purulent secretions, ear ache,   fever, chills, sweats, unintended wt loss or wt gain, classically pleuritic or exertional cp,  orthopnea pnd or arm/hand swelling  or leg swelling, presyncope, palpitations, abdominal pain, anorexia, nausea, vomiting, diarrhea  or change in bowel habits or change in bladder habits,  change in stools or change in urine, dysuria, hematuria,  rash, arthralgias, visual complaints, headache, numbness, weakness or ataxia or problems with walking or coordination,  change in mood or  memory.            Past Medical History:  Diagnosis Date  . Allergic rhinitis   . Asthma   . Diabetes ([place])   . Dysphagia   . GERD (gastroesophageal reflux disease)   . Hiatal hernia   . Hypercholesterolemia   . Hypertension   . Paresthesia     Outpatient Medications Prior to Visit  Medication Sig Dispense Refill  . albuterol (VENTOLIN HFA) 108 (90 Base) MCG/ACT inhaler USE 2 INHALATIONS ORALLY   EVERY 4 TO 6 HOURS AS      NEEDED FOR COUGH OR WHEEZE 51 g 0  . AMBULATORY NON FORMULARY MEDICATION 1 capsule daily as needed. CBD capsule-300mg  capsule    . atenolol (TENORMIN) 50 MG tablet Take 1 tablet (50 mg total) by mouth daily. 90 tablet 0  . atorvastatin (LIPITOR) 10 MG tablet Take 10 mg by mouth daily.     Marland Kitchen azelastine (ASTELIN) 0.1 % nasal spray Can use two sprays in each nostril twice daily if needed. 90 mL 1  . famotidine (PEPCID) 40 MG tablet Take 1 tablet (40 mg total) by mouth at bedtime. 30 tablet 11  . fluticasone (FLONASE) 50 MCG/ACT nasal spray Can use one spray in each nostril one to two times daily if needed. 48 g 1  .  Fluticasone Furoate (ARNUITY ELLIPTA) 200 MCG/ACT AEPB Inhale 1 Dose into the lungs daily. Rinse, gargle, and spit after use. 90 each 1  . glipiZIDE (GLUCOTROL) 5 MG tablet Take 5 mg by mouth daily.    Marland Kitchen JANUVIA 100 MG tablet Take 100 mg by mouth daily.     Marland Kitchen MELATONIN GUMMIES PO Take 2 tablets by mouth daily as needed. Take 2 (5 mg)  gummies for a total of 10mg     . metFORMIN (GLUCOPHAGE) 500 MG tablet Take 1,000 mg by mouth 2 (two) times daily.     . montelukast (SINGULAIR) 10 MG tablet Take 1 tablet (10 mg total) by mouth daily. 90 tablet 1  . olmesartan-hydrochlorothiazide (BENICAR HCT) 20-12.5 MG tablet Take 1 tablet by mouth daily. 90 tablet 1  .  omeprazole (PRILOSEC) 40 MG capsule TAKE 1 CAPSULE BY MOUTH EVERY DAY 30 capsule 11  . ONE TOUCH ULTRA TEST test strip USE AS DIRECTED TEST DAILY  11  . Turmeric (QC TUMERIC COMPLEX PO) Take 1 tablet by mouth daily as needed.         Objective:     BP 140/66 (BP Location: Left Arm, Cuff Size: Normal)   Pulse 78   Temp 98 F (36.7 C) (Temporal)   Ht 5\' 3"  (1.6 m)   Wt 118 lb (53.5 kg)   SpO2 98% Comment: on RA  BMI 20.90 kg/m   SpO2: 98 %(on RA)   amb slt hoarse wf nad   HEENT : pt wearing mask not removed for exam due to covid -19 concerns.    NECK :  without JVD/Nodes/TM/ nl carotid upstrokes bilaterally   LUNGS: no acc muscle use,  Nl contour chest which is clear to A and P bilaterally without cough on insp or exp maneuvers   CV:  RRR  no s3 or murmur or increase in P2, and no edema   ABD:  soft and nontender with nl inspiratory excursion in the supine position. No bruits or organomegaly appreciated, bowel sounds nl  MS:  Nl gait/ ext warm without deformities, calf tenderness, cyanosis or clubbing No obvious joint restrictions   SKIN: warm and dry without lesions    NEURO:  alert, approp, nl sensorium with  no motor or cerebellar deficits apparent.      I personally reviewed images and agree with radiology impression as follows:   Chest CT  11/03/2018 SPN R Apex  .24 x 1.7 x 1.8  spiculated      Assessment   Solitary pulmonary nodule on lung CT Never smoker, min exp -  Chest CT  11/03/2018 SPN R Apex  .24 x 1.7 x 1.8  Spiculated - PET 04/01/19   SPN  POS  4.5 suv, no nodes   This is curable bronchogenic ca until proven o/w in a good operative candidate therefore rec proceed to excisional bx/ rulobectomy > referred to T surgery  Discussed in detail all the  indications, usual  risks and alternatives  relative to the benefits with patient who agrees to proceed with w/u as outlined.         Upper airway cough syndrome Onset around 2008 assoc with sore  throat  - Dg Es 09/03/16 1. Small hiatal hernia with considerable GE reflux. A barium pill passes into the stomach without delay.   2. Moderate tertiary contractions.   - rec trial off arnuity 04/15/2019   I don't think this is cough variant asthma based on failure to resolve on arnuity  and lack of noct flares but more likely Upper airway cough syndrome (previously labeled PNDS),  is so named because it's frequently impossible to sort out how much is  CR/sinusitis with freq throat clearing (which can be related to primary GERD)   vs  causing  secondary (" extra esophageal")  GERD from wide swings in gastric pressure that occur with throat clearing, often  promoting self use of mint and menthol lozenges that reduce the lower esophageal sphincter tone and exacerbate the problem further in a cyclical fashion.   These are the same pts (now being labeled as having "irritable larynx syndrome" by some cough centers) who not infrequently have a history of having failed to tolerate ace inhibitors,  dry powder inhalers like arnuity  or biphosphonates or report having atypical/extraesophageal reflux symptoms that don't respond to standard doses of PPI  and are easily confused as having aecopd or asthma flares by even experienced allergists/ pulmonologists (myself included).   rec max rx for GERD including dosing ppi ac and diet/bed blocks   >>> try off arnuity for now, resume if cough worse.     Total time devoted to counseling  > 50 % of initial 60 min office visit:  review case with pt/ discussion of options/alternatives/ personally creating written customized instructions  in presence of pt  then going over those specific  Instructions directly with the pt including how to use all of the meds but in particular covering each new medication in detail and the difference between the maintenance= "automatic" meds and the prns using an action plan format for the latter (If this problem/symptom => do that  organization reading Left to right).  Please see AVS from this visit for a full list of these instructions which I personally wrote for this pt and  are unique to this visit.      Christinia Gully, MD 04/15/2019

## 2019-04-15 NOTE — Assessment & Plan Note (Signed)
Onset around 2008 assoc with sore throat  - Dg Es 09/03/16 1. Small hiatal hernia with considerable GE reflux. A barium pill passes into the stomach without delay.   2. Moderate tertiary contractions.   - rec trial off arnuity 04/15/2019   I don't think this is cough variant asthma based on failure to resolve on arnuity and lack of noct flares but more likely Upper airway cough syndrome (previously labeled PNDS),  is so named because it's frequently impossible to sort out how much is  CR/sinusitis with freq throat clearing (which can be related to primary GERD)   vs  causing  secondary (" extra esophageal")  GERD from wide swings in gastric pressure that occur with throat clearing, often  promoting self use of mint and menthol lozenges that reduce the lower esophageal sphincter tone and exacerbate the problem further in a cyclical fashion.   These are the same pts (now being labeled as having "irritable larynx syndrome" by some cough centers) who not infrequently have a history of having failed to tolerate ace inhibitors,  dry powder inhalers like arnuity  or biphosphonates or report having atypical/extraesophageal reflux symptoms that don't respond to standard doses of PPI  and are easily confused as having aecopd or asthma flares by even experienced allergists/ pulmonologists (myself included).   rec max rx for GERD including dosing ppi ac and diet/bed blocks   >>> try off arnuity for now, resume if cough worse.     Total time devoted to counseling  > 50 % of initial 60 min office visit:  review case with pt/ discussion of options/alternatives/ personally creating written customized instructions  in presence of pt  then going over those specific  Instructions directly with the pt including how to use all of the meds but in particular covering each new medication in detail and the difference between the maintenance= "automatic" meds and the prns using an action plan format for the latter (If this  problem/symptom => do that organization reading Left to right).  Please see AVS from this visit for a full list of these instructions which I personally wrote for this pt and  are unique to this visit.

## 2019-04-16 NOTE — Progress Notes (Signed)
Cardiology Office Note:    Date:  04/18/2019   ID:  Holly Harper, DOB 1946-06-04, MRN [id number]  PCP:  Holly Jews, FNP  Cardiologist:  Holly More, MD    Referring MD: Holly Jews, FNP    ASSESSMENT:    1. Essential hypertension   2. Mixed hyperlipidemia   3. Type 2 diabetes mellitus without complication, without long-term current use of insulin (HCC)    PLAN:    In order of problems listed above:  1. Stable hypertension BP at target continue treatment including beta-blocker and diuretic.  She will continue to monitor at home 2. Stable continue high intensity statin 3. stable managed by her PCP on oral medications 4. Did ask her for general cardiovascular prophylaxis take baby aspirin 1 daily 5. She has had mild brief palpitation if the symptoms worsen she will contact me and will utilize an event monitor   Next appointment: 6 months   Medication Adjustments/Labs and Tests Ordered: Current medicines are reviewed at length with the patient today.  Concerns regarding medicines are outlined above.  No orders of the defined types were placed in this encounter.  No orders of the defined types were placed in this encounter.   Chief Complaint  Patient presents with  . Follow-up    after echo  . Hypertension    History of Present Illness:    Holly Harper is a 73 y.o. female with a hx of difficult to control hypertension last seen 02/06/2019.  Echocardiogram after that visit showed ejection fraction 60 to 65% mild concentric LVH normal biatrial size and no significant valvular abnormality. Compliance with diet, lifestyle and medications: Yes  She has a lung tumor she tells me 2 cm should be seen by CT surgery and she expects to have surgical intervention.  I do not have a report but she told me a PET scan was hypermetabolic.  She has no history of cigarette smoking.  She is not having chest pain or shortness of breath or hemoptysis.  Home blood pressures have  a low variability at times greater than 939 systolic at times less than 110 but the vast majority are at target 120-130/70.  No side effects from medications she has had no lightheadedness or syncope.  Her diabetes is controlled.  In my opinion she requires no preoperative cardiology evaluation and testing prior to lung surgery Past Medical History:  Diagnosis Date  . Allergic rhinitis   . Asthma   . Diabetes ([place])   . Dysphagia   . GERD (gastroesophageal reflux disease)   . Hiatal hernia   . Hypercholesterolemia   . Hypertension   . Paresthesia     Past Surgical History:  Procedure Laterality Date  . COLONOSCOPY  02/10/2016   Mild pancolonic diverticulosis. Otherwise, normal colonoscopy  . ESOPHAGOGASTRODUODENOSCOPY  08/24/2011   Prebyesophagus. Mininal hiatal hernia. Mild gastritis  . LASIK Bilateral     Current Medications: Current Meds  Medication Sig  . albuterol (VENTOLIN HFA) 108 (90 Base) MCG/ACT inhaler USE 2 INHALATIONS ORALLY   EVERY 4 TO 6 HOURS AS      NEEDED FOR COUGH OR WHEEZE  . AMBULATORY NON FORMULARY MEDICATION 1 capsule daily as needed. CBD capsule-300mg  capsule  . atenolol (TENORMIN) 50 MG tablet Take 1 tablet (50 mg total) by mouth daily.  Marland Kitchen atorvastatin (LIPITOR) 10 MG tablet Take 10 mg by mouth daily.   Marland Kitchen azelastine (ASTELIN) 0.1 % nasal spray Can use two sprays in each nostril twice daily if needed.  Marland Kitchen  famotidine (PEPCID) 40 MG tablet Take 1 tablet (40 mg total) by mouth at bedtime.  . fluticasone (FLONASE) 50 MCG/ACT nasal spray Can use one spray in each nostril one to two times daily if needed.  Marland Kitchen glipiZIDE (GLUCOTROL) 5 MG tablet Take 5 mg by mouth daily.  Marland Kitchen JANUVIA 100 MG tablet Take 100 mg by mouth daily.   Marland Kitchen MELATONIN GUMMIES PO Take 2 tablets by mouth daily as needed. Take 2 (5 mg)  gummies for a total of 10mg   . metFORMIN (GLUCOPHAGE) 500 MG tablet Take 1,000 mg by mouth 2 (two) times daily.   . montelukast (SINGULAIR) 10 MG tablet Take 1 tablet  (10 mg total) by mouth daily.  Marland Kitchen olmesartan-hydrochlorothiazide (BENICAR HCT) 20-12.5 MG tablet Take 1 tablet by mouth daily.  Marland Kitchen omeprazole (PRILOSEC) 40 MG capsule TAKE 1 CAPSULE BY MOUTH EVERY DAY  . ONE TOUCH ULTRA TEST test strip USE AS DIRECTED TEST DAILY  . Turmeric (QC TUMERIC COMPLEX PO) Take 1 tablet by mouth daily as needed.      Allergies:   Amoxicillin, Cinobac [cinoxacin], and Macrodantin [nitrofurantoin macrocrystal]   Social History   Socioeconomic History  . Marital status: Single    Spouse name: Not on file  . Number of children: Not on file  . Years of education: Not on file  . Highest education level: Not on file  Occupational History  . Not on file  Social Needs  . Financial resource strain: Not on file  . Food insecurity    Worry: Not on file    Inability: Not on file  . Transportation needs    Medical: Not on file    Non-medical: Not on file  Tobacco Use  . Smoking status: Never Smoker  . Smokeless tobacco: Never Used  Substance and Sexual Activity  . Alcohol use: Not Currently    Frequency: Never    Comment: quit drinking years ago   . Drug use: Not Currently  . Sexual activity: Not on file  Lifestyle  . Physical activity    Days per week: Not on file    Minutes per session: Not on file  . Stress: Not on file  Relationships  . Social Herbalist on phone: Not on file    Gets together: Not on file    Attends religious service: Not on file    Active member of club or organization: Not on file    Attends meetings of clubs or organizations: Not on file    Relationship status: Not on file  Other Topics Concern  . Not on file  Social History Narrative  . Not on file     Family History: The patient's family history includes Bipolar disorder in her sister; Lung cancer in her brother; Prostate cancer in her father. There is no history of Colon cancer or Esophageal cancer. ROS:   Please see the history of present illness.    All other  systems reviewed and are negative.  EKGs/Labs/Other Studies Reviewed:    The following studies were reviewed today  Recent Labs: 02/13/2019: BUN 10; Creatinine, Ser 0.70; Potassium 4.6; Sodium 137; TSH 0.874  Recent Lipid Panel No results found for: CHOL, TRIG, HDL, CHOLHDL, VLDL, LDLCALC, LDLDIRECT  Physical Exam:    VS:  BP 140/68 (BP Location: Right Arm, Patient Position: Sitting, Cuff Size: Normal)   Pulse 86   Ht 5\' 3"  (1.6 m)   Wt 120 lb (54.4 kg)   SpO2 99%  BMI 21.26 kg/m     Wt Readings from Last 3 Encounters:  04/18/19 120 lb (54.4 kg)  04/15/19 118 lb (53.5 kg)  03/18/19 117 lb 6 oz (53.2 kg)     GEN:  Well nourished, well developed in no acute distress HEENT: Normal NECK: No JVD; No carotid bruits LYMPHATICS: No lymphadenopathy CARDIAC: RRR, no murmurs, rubs, gallops RESPIRATORY:  Clear to auscultation without rales, wheezing or rhonchi  ABDOMEN: Soft, non-tender, non-distended MUSCULOSKELETAL:  No edema; No deformity  SKIN: Warm and dry NEUROLOGIC:  Alert and oriented x 3 PSYCHIATRIC:  Normal affect    Signed, Holly More, MD  04/18/2019 8:35 AM    [place]

## 2019-04-18 ENCOUNTER — Other Ambulatory Visit: Payer: Self-pay

## 2019-04-18 ENCOUNTER — Encounter: Payer: Self-pay | Admitting: Cardiology

## 2019-04-18 ENCOUNTER — Ambulatory Visit (INDEPENDENT_AMBULATORY_CARE_PROVIDER_SITE_OTHER): Payer: Medicare Other | Admitting: Cardiology

## 2019-04-18 VITALS — BP 140/68 | HR 86 | Ht 63.0 in | Wt 120.0 lb

## 2019-04-18 DIAGNOSIS — I10 Essential (primary) hypertension: Secondary | ICD-10-CM

## 2019-04-18 DIAGNOSIS — E782 Mixed hyperlipidemia: Secondary | ICD-10-CM

## 2019-04-18 DIAGNOSIS — E119 Type 2 diabetes mellitus without complications: Secondary | ICD-10-CM | POA: Diagnosis not present

## 2019-04-18 MED ORDER — ASPIRIN EC 81 MG PO TBEC: 81.0000 mg | DELAYED_RELEASE_TABLET | Freq: Every day | ORAL | 3 refills | Status: DC

## 2019-04-18 NOTE — Patient Instructions (Signed)
Medication Instructions:  Your physician has recommended you make the following change in your medication:   START aspirin 81 mg: Take 1 tablet daily   *If you need a refill on your cardiac medications before your next appointment, please call your pharmacy*  Lab Work: None  If you have labs (blood work) drawn today and your tests are completely normal, you will receive your results only by: Marland Kitchen MyChart Message (if you have MyChart) OR . A paper copy in the mail If you have any lab test that is abnormal or we need to change your treatment, we will call you to review the results.  Testing/Procedures: None  Follow-Up: At [facility], you and your health needs are our priority.  As part of our continuing mission to provide you with exceptional heart care, we have created designated Provider Care Teams.  These Care Teams include your primary Cardiologist (physician) and Advanced Practice Providers (APPs -  Physician Assistants and Nurse Practitioners) who all work together to provide you with the care you need, when you need it.  Your next appointment:   6 months  The format for your next appointment:   In Person  Provider:   Shirlee More, MD     Aspirin and Your Heart  Aspirin is a medicine that prevents the cells in the blood that are used for clotting, called platelets, from sticking together. Aspirin can be used to help reduce the risk of blood clots, heart attacks, and other heart-related problems. Can I take aspirin? Your health care provider will help you determine whether it is safe and beneficial for you to take aspirin daily. Taking aspirin daily may be helpful if you:  Have had a heart attack or chest pain.  Are at risk for a heart attack.  Have undergone open-heart surgery, such as coronary artery bypass surgery (CABG).  Have had coronary angioplasty or a stent.  Have had certain types of stroke or transient ischemic attack (TIA).  Have peripheral artery  disease (PAD).  Have chronic heart rhythm problems such as atrial fibrillation and cannot take an anticoagulant.  Have valve disease or have had surgery on a valve. What are the risks? Daily use of aspirin can cause side effects. Some of these include:  Bleeding. Bleeding problems can be minor or serious. An example of a minor problem is a cut that does not stop bleeding. An example of a more serious problem is stomach bleeding or, rarely, bleeding into the brain. Your risk of bleeding is increased if you are also taking non-steroidal anti-inflammatory drugs (NSAIDs).  Increased bruising.  Upset stomach.  An allergic reaction. People who have nasal polyps have an increased risk of developing an aspirin allergy. General guidelines  Take aspirin only as told by your health care provider. Make sure that you understand how much you should take and what form you should take. The two forms of aspirin are: ? Non-enteric-coated.This type of aspirin does not have a coating and is absorbed quickly. This type of aspirin also comes in a chewable form. ? Enteric-coated. This type of aspirin has a coating that releases the medicine very slowly. Enteric-coated aspirin might cause less stomach upset than non-enteric-coated aspirin. This type of aspirin should not be chewed or crushed.  Limit alcohol intake to no more than 1 drink a day for nonpregnant women and 2 drinks a day for men. Drinking alcohol increases your risk of bleeding. One drink equals 12 oz of beer, 5 oz of wine, or  1 oz of hard liquor. Contact a health care provider if you:  Have unusual bleeding or bruising.  Have stomach pain or nausea.  Have ringing in your ears.  Have an allergic reaction that causes: ? Hives. ? Itchy skin. ? Swelling of the lips, tongue, or face. Get help right away if you:  Notice that your bowel movements are bloody, dark red, or black in color.  Vomit or cough up blood.  Have blood in your urine.   Cough, have noisy breathing (wheeze), or feel short of breath.  Have chest pain, especially if the pain spreads to the arms, back, neck, or jaw.  Have a severe headache, or a headache with confusion, or dizziness. These symptoms may represent a serious problem that is an emergency. Do not wait to see if the symptoms will go away. Get medical help right away. Call your local emergency services (911 in the [place] Do not drive yourself to the hospital. Summary  Aspirin can be used to help reduce the risk of blood clots, heart attacks, and other heart-related problems.  Daily use of aspirin can increase your risk of side effects. Your health care provider will help you determine whether it is safe and beneficial for you to take aspirin daily.  Take aspirin only as told by your health care provider. Make sure that you understand how much you can take and what form you can take. This information is not intended to replace advice given to you by your health care provider. Make sure you discuss any questions you have with your health care provider. Document Released: 05/19/2008 Document Revised: 04/06/2017 Document Reviewed: 04/06/2017 Elsevier Patient Education  2020 [employer].

## 2019-04-23 ENCOUNTER — Encounter: Payer: Self-pay | Admitting: Thoracic Surgery (Cardiothoracic Vascular Surgery)

## 2019-04-23 ENCOUNTER — Other Ambulatory Visit: Payer: Self-pay

## 2019-04-23 ENCOUNTER — Institutional Professional Consult (permissible substitution) (INDEPENDENT_AMBULATORY_CARE_PROVIDER_SITE_OTHER): Payer: Medicare Other | Admitting: Thoracic Surgery (Cardiothoracic Vascular Surgery)

## 2019-04-23 VITALS — BP 126/79 | HR 83 | Temp 97.5°F | Resp 16 | Ht 63.0 in | Wt 120.0 lb

## 2019-04-23 DIAGNOSIS — R911 Solitary pulmonary nodule: Secondary | ICD-10-CM | POA: Diagnosis not present

## 2019-04-23 NOTE — Progress Notes (Signed)
PCP is Sarajane Jews, FNP Referring Provider is Tanda Rockers, MD  Chief Complaint  Patient presents with  . Lung Lesion    Consultation    HPI: Holly Harper is sent for consultation regarding a right upper lobe lung nodule.  Holly Harper is a 73 year old woman with a past history significant for a hiatal hernia with reflux, type 2 diabetes without complication, hypertension, hyperlipidemia, asthma, and paresthesias.  She recently had been having difficulty with swallowing.  Mostly solids.  She also complained of chronic cough and chronic throat clearing.  She saw Dr. Neldon Mc.  As part of the work-up he did a chest x-ray which showed a lung nodule.  A CT was done on 03/06/2019 which showed a 2.4 x 1.7 x 1.8 cm spiculated mass in the right apex.  On PET CT the nodule was hypermetabolic with an SUV of 4.5.  She did not have any evidence of regional or distant metastases.  There was some activity in the rectum as well.  She was referred to Dr. Melvyn Novas.  He felt that this was appropriate for excisional biopsy.  She does have cough.  She has not been having any significant shortness of breath.  She denies any chest pain.  She did see Dr. Bettina Gavia from cardiology and he did not feel that any further work-up was necessary.  Her appetite is good.  She has not had any significant weight loss.  No unusual headaches or visual changes.  Zubrod Score: At the time of surgery this patient's most appropriate activity status/level should be described as: [x]     0    Normal activity, no symptoms []     1    Restricted in physical strenuous activity but ambulatory, able to do out light work []     2    Ambulatory and capable of self care, unable to do work activities, up and about >50 % of waking hours                              []     3    Only limited self care, in bed greater than 50% of waking hours []     4    Completely disabled, no self care, confined to bed or chair []     5    Moribund  Past Medical  History:  Diagnosis Date  . Allergic rhinitis   . Asthma   . Diabetes ([place])   . Dysphagia   . GERD (gastroesophageal reflux disease)   . Hiatal hernia   . Hypercholesterolemia   . Hypertension   . Paresthesia     Past Surgical History:  Procedure Laterality Date  . COLONOSCOPY  02/10/2016   Mild pancolonic diverticulosis. Otherwise, normal colonoscopy  . ESOPHAGOGASTRODUODENOSCOPY  08/24/2011   Prebyesophagus. Mininal hiatal hernia. Mild gastritis  . LASIK Bilateral     Family History  Problem Relation Age of Onset  . Prostate cancer Father   . Bipolar disorder Sister   . Lung cancer Brother        Smoker  . Colon cancer Neg Hx   . Esophageal cancer Neg Hx     Social History Social History   Tobacco Use  . Smoking status: Never Smoker  . Smokeless tobacco: Never Used  Substance Use Topics  . Alcohol use: Not Currently    Frequency: Never    Comment: quit drinking years ago   . Drug use: Not Currently  Current Outpatient Medications  Medication Sig Dispense Refill  . albuterol (VENTOLIN HFA) 108 (90 Base) MCG/ACT inhaler USE 2 INHALATIONS ORALLY   EVERY 4 TO 6 HOURS AS      NEEDED FOR COUGH OR WHEEZE 51 g 0  . AMBULATORY NON FORMULARY MEDICATION 1 capsule daily as needed. CBD capsule-300mg  capsule    . aspirin EC 81 MG tablet Take 1 tablet (81 mg total) by mouth daily. 90 tablet 3  . atenolol (TENORMIN) 50 MG tablet Take 1 tablet (50 mg total) by mouth daily. 90 tablet 0  . atorvastatin (LIPITOR) 10 MG tablet Take 10 mg by mouth daily.     Marland Kitchen azelastine (ASTELIN) 0.1 % nasal spray Can use two sprays in each nostril twice daily if needed. 90 mL 1  . famotidine (PEPCID) 40 MG tablet Take 1 tablet (40 mg total) by mouth at bedtime. 30 tablet 11  . fluticasone (FLONASE) 50 MCG/ACT nasal spray Can use one spray in each nostril one to two times daily if needed. 48 g 1  . glipiZIDE (GLUCOTROL) 5 MG tablet Take 5 mg by mouth daily.    Marland Kitchen JANUVIA 100 MG tablet Take 100  mg by mouth daily.     Marland Kitchen MELATONIN GUMMIES PO Take 10 mg by mouth daily as needed (at hs).     . metFORMIN (GLUCOPHAGE) 500 MG tablet Take 1,000 mg by mouth 2 (two) times daily.     . montelukast (SINGULAIR) 10 MG tablet Take 1 tablet (10 mg total) by mouth daily. 90 tablet 1  . olmesartan-hydrochlorothiazide (BENICAR HCT) 20-12.5 MG tablet Take 1 tablet by mouth daily. 90 tablet 1  . omeprazole (PRILOSEC) 40 MG capsule TAKE 1 CAPSULE BY MOUTH EVERY DAY 30 capsule 11  . ONE TOUCH ULTRA TEST test strip USE AS DIRECTED TEST DAILY  11  . Turmeric (QC TUMERIC COMPLEX PO) Take 1 tablet by mouth daily as needed.      No current facility-administered medications for this visit.     Allergies  Allergen Reactions  . Amoxicillin Hives  . Cinobac [Cinoxacin]   . Macrodantin [Nitrofurantoin Macrocrystal] Hives    Review of Systems  Constitutional: Negative for activity change, appetite change and unexpected weight change.  HENT: Positive for sore throat and trouble swallowing.   Respiratory: Positive for cough. Negative for shortness of breath and wheezing.   Cardiovascular: Negative for chest pain, palpitations and leg swelling.  Gastrointestinal: Positive for abdominal pain (Reflux).  Genitourinary: Negative for difficulty urinating and dysuria.  Musculoskeletal: Negative for arthralgias and myalgias.  Neurological: Negative for seizures, syncope and weakness.  Hematological: Negative for adenopathy. Does not bruise/bleed easily.  All other systems reviewed and are negative.   BP 126/79 (BP Location: Right Arm)   Pulse 83   Temp (!) 97.5 F (36.4 C) (Skin)   Resp 16   Ht 5\' 3"  (1.6 m)   Wt 120 lb (54.4 kg)   SpO2 95% Comment: RA  BMI 21.26 kg/m  Physical Exam Vitals signs reviewed.  Constitutional:      General: She is not in acute distress.    Appearance: Normal appearance.  HENT:     Head: Normocephalic and atraumatic.  Eyes:     General: No scleral icterus.    Extraocular  Movements: Extraocular movements intact.  Neck:     Musculoskeletal: Neck supple.  Cardiovascular:     Rate and Rhythm: Normal rate and regular rhythm.     Heart sounds: Murmur (2/6 systolic)  present.  Pulmonary:     Effort: Pulmonary effort is normal. No respiratory distress.     Breath sounds: Normal breath sounds. No wheezing or rales.  Abdominal:     General: Abdomen is flat. There is no distension.     Palpations: Abdomen is soft.     Tenderness: There is no abdominal tenderness.  Musculoskeletal:        General: No swelling.  Lymphadenopathy:     Cervical: No cervical adenopathy.  Skin:    General: Skin is warm and dry.  Neurological:     General: No focal deficit present.     Mental Status: She is alert and oriented to person, place, and time.     Cranial Nerves: No cranial nerve deficit.     Motor: No weakness.     Gait: Gait normal.    Diagnostic Tests: I personally reviewed the CT and PET/CT images.  I reviewed the reports and concur with the findings noted  Impression: Holly Harper is a 73 year old non-smoker with a past medical history significant for uncomplicated type 2 diabetes, hiatal hernia with reflux, hypertension, hyperlipidemia, and asthma.  She recently was being evaluated for a cough and throat clearing.  As part of that work-up she had a chest x-ray, which showed a lung nodule.  That led to a CT of the chest which showed a spiculated nodule in the right apex.  On PET CT that nodule is hypermetabolic.  I reviewed the CT and PET/CT images with Mrs. Emmer.  We discussed the differential diagnosis which includes primary bronchogenic carcinoma, as well as infectious or inflammatory nodules.  Given the hypermetabolic activity on PET this has to be considered a primary bronchogenic carcinoma until can be proven otherwise.  We discussed the possibility of doing a CT-guided or bronchoscopic biopsy.  Both of those are subject to false negative results and would  not definitively rule out the possibility of cancer.  I recommended that we proceed with right VATS for wedge resection for definitive diagnosis.  If cancerous we will proceed with a right upper lobectomy at the same setting.  I discussed the general nature of the operation with Mrs. Endres.  We discussed the intraoperative decision making as to whether to proceed with a lobectomy.  I informed her of the need for general anesthesia, the incisions to be used, the use of drains to postoperatively, the expected hospital stay, and the overall recovery.  I informed her of the indications, risks, benefits, and alternatives.  She understands the risks include, but not limited to death, MI, DVT, PE, bleeding, possible need for transfusion, infection, prolonged air leak, cardiac arrhythmias, as well as the possibility of other unforeseeable complications.  She accepts the risks and wishes to proceed.  Plan: Right VATS, wedge resection, possible lobectomy on Thursday, 05/09/2019  Melrose Nakayama, MD [facility] [phone]

## 2019-04-23 NOTE — H&P (View-Only) (Signed)
PCP is Sarajane Jews, FNP Referring Provider is Tanda Rockers, MD  Chief Complaint  Patient presents with  . Lung Lesion    Consultation    HPI: [name] is sent for consultation regarding a right upper lobe lung nodule.  Holly Harper is a 73 year old woman with a past history significant for a hiatal hernia with reflux, type 2 diabetes without complication, hypertension, hyperlipidemia, asthma, and paresthesias.  She recently had been having difficulty with swallowing.  Mostly solids.  She also complained of chronic cough and chronic throat clearing.  She saw Dr. Neldon Mc.  As part of the work-up he did a chest x-ray which showed a lung nodule.  A CT was done on 03/06/2019 which showed a 2.4 x 1.7 x 1.8 cm spiculated mass in the right apex.  On PET CT the nodule was hypermetabolic with an SUV of 4.5.  She did not have any evidence of regional or distant metastases.  There was some activity in the rectum as well.  She was referred to Dr. Melvyn Novas.  He felt that this was appropriate for excisional biopsy.  She does have cough.  She has not been having any significant shortness of breath.  She denies any chest pain.  She did see Dr. Bettina Gavia from cardiology and he did not feel that any further work-up was necessary.  Her appetite is good.  She has not had any significant weight loss.  No unusual headaches or visual changes.  Zubrod Score: At the time of surgery this patient's most appropriate activity status/level should be described as: [x]     0    Normal activity, no symptoms []     1    Restricted in physical strenuous activity but ambulatory, able to do out light work []     2    Ambulatory and capable of self care, unable to do work activities, up and about >50 % of waking hours                              []     3    Only limited self care, in bed greater than 50% of waking hours []     4    Completely disabled, no self care, confined to bed or chair []     5    Moribund  Past Medical  History:  Diagnosis Date  . Allergic rhinitis   . Asthma   . Diabetes ([place])   . Dysphagia   . GERD (gastroesophageal reflux disease)   . Hiatal hernia   . Hypercholesterolemia   . Hypertension   . Paresthesia     Past Surgical History:  Procedure Laterality Date  . COLONOSCOPY  02/10/2016   Mild pancolonic diverticulosis. Otherwise, normal colonoscopy  . ESOPHAGOGASTRODUODENOSCOPY  08/24/2011   Prebyesophagus. Mininal hiatal hernia. Mild gastritis  . LASIK Bilateral     Family History  Problem Relation Age of Onset  . Prostate cancer Father   . Bipolar disorder Sister   . Lung cancer Brother        Smoker  . Colon cancer Neg Hx   . Esophageal cancer Neg Hx     Social History Social History   Tobacco Use  . Smoking status: Never Smoker  . Smokeless tobacco: Never Used  Substance Use Topics  . Alcohol use: Not Currently    Frequency: Never    Comment: quit drinking years ago   . Drug use: Not Currently  Current Outpatient Medications  Medication Sig Dispense Refill  . albuterol (VENTOLIN HFA) 108 (90 Base) MCG/ACT inhaler USE 2 INHALATIONS ORALLY   EVERY 4 TO 6 HOURS AS      NEEDED FOR COUGH OR WHEEZE 51 g 0  . AMBULATORY NON FORMULARY MEDICATION 1 capsule daily as needed. CBD capsule-300mg  capsule    . aspirin EC 81 MG tablet Take 1 tablet (81 mg total) by mouth daily. 90 tablet 3  . atenolol (TENORMIN) 50 MG tablet Take 1 tablet (50 mg total) by mouth daily. 90 tablet 0  . atorvastatin (LIPITOR) 10 MG tablet Take 10 mg by mouth daily.     Marland Kitchen azelastine (ASTELIN) 0.1 % nasal spray Can use two sprays in each nostril twice daily if needed. 90 mL 1  . famotidine (PEPCID) 40 MG tablet Take 1 tablet (40 mg total) by mouth at bedtime. 30 tablet 11  . fluticasone (FLONASE) 50 MCG/ACT nasal spray Can use one spray in each nostril one to two times daily if needed. 48 g 1  . glipiZIDE (GLUCOTROL) 5 MG tablet Take 5 mg by mouth daily.    Marland Kitchen JANUVIA 100 MG tablet Take 100  mg by mouth daily.     Marland Kitchen MELATONIN GUMMIES PO Take 10 mg by mouth daily as needed (at hs).     . metFORMIN (GLUCOPHAGE) 500 MG tablet Take 1,000 mg by mouth 2 (two) times daily.     . montelukast (SINGULAIR) 10 MG tablet Take 1 tablet (10 mg total) by mouth daily. 90 tablet 1  . olmesartan-hydrochlorothiazide (BENICAR HCT) 20-12.5 MG tablet Take 1 tablet by mouth daily. 90 tablet 1  . omeprazole (PRILOSEC) 40 MG capsule TAKE 1 CAPSULE BY MOUTH EVERY DAY 30 capsule 11  . ONE TOUCH ULTRA TEST test strip USE AS DIRECTED TEST DAILY  11  . Turmeric (QC TUMERIC COMPLEX PO) Take 1 tablet by mouth daily as needed.      No current facility-administered medications for this visit.     Allergies  Allergen Reactions  . Amoxicillin Hives  . Cinobac [Cinoxacin]   . Macrodantin [Nitrofurantoin Macrocrystal] Hives    Review of Systems  Constitutional: Negative for activity change, appetite change and unexpected weight change.  HENT: Positive for sore throat and trouble swallowing.   Respiratory: Positive for cough. Negative for shortness of breath and wheezing.   Cardiovascular: Negative for chest pain, palpitations and leg swelling.  Gastrointestinal: Positive for abdominal pain (Reflux).  Genitourinary: Negative for difficulty urinating and dysuria.  Musculoskeletal: Negative for arthralgias and myalgias.  Neurological: Negative for seizures, syncope and weakness.  Hematological: Negative for adenopathy. Does not bruise/bleed easily.  All other systems reviewed and are negative.   BP 126/79 (BP Location: Right Arm)   Pulse 83   Temp (!) 97.5 F (36.4 C) (Skin)   Resp 16   Ht 5\' 3"  (1.6 m)   Wt 120 lb (54.4 kg)   SpO2 95% Comment: RA  BMI 21.26 kg/m  Physical Exam Vitals signs reviewed.  Constitutional:      General: She is not in acute distress.    Appearance: Normal appearance.  HENT:     Head: Normocephalic and atraumatic.  Eyes:     General: No scleral icterus.    Extraocular  Movements: Extraocular movements intact.  Neck:     Musculoskeletal: Neck supple.  Cardiovascular:     Rate and Rhythm: Normal rate and regular rhythm.     Heart sounds: Murmur (2/6 systolic)  present.  Pulmonary:     Effort: Pulmonary effort is normal. No respiratory distress.     Breath sounds: Normal breath sounds. No wheezing or rales.  Abdominal:     General: Abdomen is flat. There is no distension.     Palpations: Abdomen is soft.     Tenderness: There is no abdominal tenderness.  Musculoskeletal:        General: No swelling.  Lymphadenopathy:     Cervical: No cervical adenopathy.  Skin:    General: Skin is warm and dry.  Neurological:     General: No focal deficit present.     Mental Status: She is alert and oriented to person, place, and time.     Cranial Nerves: No cranial nerve deficit.     Motor: No weakness.     Gait: Gait normal.    Diagnostic Tests: I personally reviewed the CT and PET/CT images.  I reviewed the reports and concur with the findings noted  Impression: Holly Harper is a 73 year old non-smoker with a past medical history significant for uncomplicated type 2 diabetes, hiatal hernia with reflux, hypertension, hyperlipidemia, and asthma.  She recently was being evaluated for a cough and throat clearing.  As part of that work-up she had a chest x-ray, which showed a lung nodule.  That led to a CT of the chest which showed a spiculated nodule in the right apex.  On PET CT that nodule is hypermetabolic.  I reviewed the CT and PET/CT images with Mrs. Pettus.  We discussed the differential diagnosis which includes primary bronchogenic carcinoma, as well as infectious or inflammatory nodules.  Given the hypermetabolic activity on PET this has to be considered a primary bronchogenic carcinoma until can be proven otherwise.  We discussed the possibility of doing a CT-guided or bronchoscopic biopsy.  Both of those are subject to false negative results and would  not definitively rule out the possibility of cancer.  I recommended that we proceed with right VATS for wedge resection for definitive diagnosis.  If cancerous we will proceed with a right upper lobectomy at the same setting.  I discussed the general nature of the operation with Mrs. Nauta.  We discussed the intraoperative decision making as to whether to proceed with a lobectomy.  I informed her of the need for general anesthesia, the incisions to be used, the use of drains to postoperatively, the expected hospital stay, and the overall recovery.  I informed her of the indications, risks, benefits, and alternatives.  She understands the risks include, but not limited to death, MI, DVT, PE, bleeding, possible need for transfusion, infection, prolonged air leak, cardiac arrhythmias, as well as the possibility of other unforeseeable complications.  She accepts the risks and wishes to proceed.  Plan: Right VATS, wedge resection, possible lobectomy on Thursday, 05/09/2019  Melrose Nakayama, MD [facility] [phone]

## 2019-04-24 ENCOUNTER — Encounter: Payer: Self-pay | Admitting: *Deleted

## 2019-04-24 ENCOUNTER — Other Ambulatory Visit: Payer: Self-pay | Admitting: *Deleted

## 2019-04-24 DIAGNOSIS — R911 Solitary pulmonary nodule: Secondary | ICD-10-CM

## 2019-04-26 ENCOUNTER — Telehealth: Payer: Self-pay | Admitting: Cardiology

## 2019-04-26 NOTE — Telephone Encounter (Signed)
Left message to return call 

## 2019-04-26 NOTE — Telephone Encounter (Signed)
Her BP is 84/63 and she took an extra BP pill

## 2019-05-01 ENCOUNTER — Encounter: Payer: Self-pay | Admitting: *Deleted

## 2019-05-06 ENCOUNTER — Ambulatory Visit: Payer: Medicare Other | Admitting: Allergy and Immunology

## 2019-05-06 NOTE — Progress Notes (Signed)
[id number] - [place], [place] - [address] [place] [address] Phone: [phone] Fax: [phone]  [facility] [place] [place] [facility] [address] [place] [address] Phone: [phone] Fax: [phone]      Your procedure is scheduled on November 19  Report to [facility] Main Entrance "A" at 1120 A.M., and check in at the Admitting office.  Call this number if you have problems the morning of surgery:  [phone]  Call [phone] if you have any questions prior to your surgery date Monday-Friday 8am-4pm    Remember:  Do not eat or drink after midnight the night before your surgery     Take these medicines the morning of surgery with A SIP OF WATER  albuterol (VENTOLIN HFA), Please bring all inhalers with you the day of surgery.  atenolol (TENORMIN) azelastine (ASTELIN) if needed fluticasone (FLONASE omeprazole (PRILOSEC)  Follow your surgeon's instructions on when to stop Aspirin.  If no instructions were given by your surgeon then you will need to call the office to get those instructions.    7 days prior to surgery STOP taking any Aspirin (unless otherwise instructed by your surgeon), Aleve, Naproxen, Ibuprofen, Motrin, Advil, Goody's, BC's, all herbal medications, fish oil, and all vitamins.    WHAT DO I DO ABOUT MY DIABETES MEDICATION?   Marland Kitchen Do not take oral diabetes medicines (pills) the morning of surgery. metFORMIN (GLUCOPHAGE), glipiZIDE (GLUCOTROL XL)    How to Manage Your Diabetes Before and After Surgery  Why is it important to control my blood sugar before and after surgery? . Improving blood sugar levels before and after surgery helps healing and can limit problems. . A way of improving blood sugar control is eating a healthy diet by: o  Eating less sugar and carbohydrates o  Increasing activity/exercise o  Talking with your doctor  about reaching your blood sugar goals . High blood sugars (greater than 180 mg/dL) can raise your risk of infections and slow your recovery, so you will need to focus on controlling your diabetes during the weeks before surgery. . Make sure that the doctor who takes care of your diabetes knows about your planned surgery including the date and location.  How do I manage my blood sugar before surgery? . Check your blood sugar at least 4 times a day, starting 2 days before surgery, to make sure that the level is not too high or low. o Check your blood sugar the morning of your surgery when you wake up and every 2 hours until you get to the Short Stay unit. . If your blood sugar is less than 70 mg/dL, you will need to treat for low blood sugar: o Do not take insulin. o Treat a low blood sugar (less than 70 mg/dL) with  cup of clear juice (cranberry or apple), 4 glucose tablets, OR glucose gel. o Recheck blood sugar in 15 minutes after treatment (to make sure it is greater than 70 mg/dL). If your blood sugar is not greater than 70 mg/dL on recheck, call [phone] for further instructions. . Report your blood sugar to the short stay nurse when you get to Short Stay.  . If you are admitted to the hospital after surgery: o Your blood sugar will be checked by the staff and you will probably be given insulin after surgery (instead of oral diabetes medicines) to make sure you  have good blood sugar levels. o The goal for blood sugar control after surgery is 80-180 mg/dL.   The Morning of Surgery  Do not wear jewelry, make-up or nail polish.  Do not wear lotions, powders, or perfumes or deodorant  Do not shave 48 hours prior to surgery.    Do not bring valuables to the hospital.  [facility] is not responsible for any belongings or valuables.  If you are a smoker, DO NOT Smoke 24 hours prior to surgery IF you wear a CPAP at night please bring your mask, tubing, and machine the morning of surgery    Remember that you must have someone to transport you home after your surgery, and remain with you for 24 hours if you are discharged the same day.   Contacts, glasses, hearing aids, dentures or bridgework may not be worn into surgery.    Leave your suitcase in the car.  After surgery it may be brought to your room.  For patients admitted to the hospital, discharge time will be determined by your treatment team.  Patients discharged the day of surgery will not be allowed to drive home.    Special instructions:   [place]- Preparing For Surgery  Before surgery, you can play an important role. Because skin is not sterile, your skin needs to be as free of germs as possible. You can reduce the number of germs on your skin by washing with CHG (chlorahexidine gluconate) Soap before surgery.  CHG is an antiseptic cleaner which kills germs and bonds with the skin to continue killing germs even after washing.    Oral Hygiene is also important to reduce your risk of infection.  Remember - BRUSH YOUR TEETH THE MORNING OF SURGERY WITH YOUR REGULAR TOOTHPASTE  Please do not use if you have an allergy to CHG or antibacterial soaps. If your skin becomes reddened/irritated stop using the CHG.  Do not shave (including legs and underarms) for at least 48 hours prior to first CHG shower. It is OK to shave your face.  Please follow these instructions carefully.   1. Shower the NIGHT BEFORE SURGERY and the MORNING OF SURGERY with CHG Soap.   2. If you chose to wash your hair, wash your hair first as usual with your normal shampoo.  3. After you shampoo, rinse your hair and body thoroughly to remove the shampoo.  4. Use CHG as you would any other liquid soap. You can apply CHG directly to the skin and wash gently with a scrungie or a clean washcloth.   5. Apply the CHG Soap to your body ONLY FROM THE NECK DOWN.  Do not use on open wounds or open sores. Avoid contact with your eyes, ears, mouth and  genitals (private parts). Wash Face and genitals (private parts)  with your normal soap.   6. Wash thoroughly, paying special attention to the area where your surgery will be performed.  7. Thoroughly rinse your body with warm water from the neck down.  8. DO NOT shower/wash with your normal soap after using and rinsing off the CHG Soap.  9. Pat yourself dry with a CLEAN TOWEL.  10. Wear CLEAN PAJAMAS to bed the night before surgery, wear comfortable clothes the morning of surgery  11. Place CLEAN SHEETS on your bed the night of your first shower and DO NOT SLEEP WITH PETS.    Day of Surgery:  Do not apply any deodorants/lotions. Please shower the morning of surgery with the  CHG soap  Please wear clean clothes to the hospital/surgery center.   Remember to brush your teeth WITH YOUR REGULAR TOOTHPASTE.   Please read over the following fact sheets that you were given.

## 2019-05-07 ENCOUNTER — Other Ambulatory Visit (HOSPITAL_COMMUNITY)
Admission: RE | Admit: 2019-05-07 | Discharge: 2019-05-07 | Disposition: A | Discharge status: Home / Self Care | Payer: Medicare Other | Source: Ambulatory Visit | Attending: Thoracic Surgery (Cardiothoracic Vascular Surgery) | Admitting: Thoracic Surgery (Cardiothoracic Vascular Surgery)

## 2019-05-07 ENCOUNTER — Other Ambulatory Visit: Payer: Self-pay

## 2019-05-07 ENCOUNTER — Encounter (HOSPITAL_COMMUNITY)
Admission: RE | Admit: 2019-05-07 | Discharge: 2019-05-07 | Disposition: A | Discharge status: Home / Self Care | Payer: Medicare Other | Source: Ambulatory Visit | Attending: Thoracic Surgery (Cardiothoracic Vascular Surgery) | Admitting: Thoracic Surgery (Cardiothoracic Vascular Surgery)

## 2019-05-07 ENCOUNTER — Encounter (HOSPITAL_COMMUNITY): Payer: Self-pay

## 2019-05-07 DIAGNOSIS — I083 Combined rheumatic disorders of mitral, aortic and tricuspid valves: Secondary | ICD-10-CM | POA: Insufficient documentation

## 2019-05-07 DIAGNOSIS — I491 Atrial premature depolarization: Secondary | ICD-10-CM | POA: Insufficient documentation

## 2019-05-07 DIAGNOSIS — Z01818 Encounter for other preprocedural examination: Secondary | ICD-10-CM | POA: Insufficient documentation

## 2019-05-07 DIAGNOSIS — E119 Type 2 diabetes mellitus without complications: Secondary | ICD-10-CM | POA: Insufficient documentation

## 2019-05-07 DIAGNOSIS — I10 Essential (primary) hypertension: Secondary | ICD-10-CM | POA: Insufficient documentation

## 2019-05-07 DIAGNOSIS — R911 Solitary pulmonary nodule: Secondary | ICD-10-CM

## 2019-05-07 DIAGNOSIS — R9431 Abnormal electrocardiogram [ECG] [EKG]: Secondary | ICD-10-CM | POA: Insufficient documentation

## 2019-05-07 HISTORY — DX: Anxiety disorder, unspecified: F41.9

## 2019-05-07 HISTORY — DX: Family history of other specified conditions: Z84.89

## 2019-05-07 LAB — GLUCOSE, CAPILLARY: Glucose-Capillary: 133 mg/dL — ABNORMAL HIGH (ref 70–99)

## 2019-05-07 LAB — COMPREHENSIVE METABOLIC PANEL
ALT: 25 U/L (ref 0–44)
AST: 24 U/L (ref 15–41)
Albumin: 4 g/dL (ref 3.5–5.0)
Alkaline Phosphatase: 43 U/L (ref 38–126)
Anion gap: 12 (ref 5–15)
BUN: 12 mg/dL (ref 8–23)
CO2: 20 mmol/L — ABNORMAL LOW (ref 22–32)
Calcium: 9.2 mg/dL (ref 8.9–10.3)
Chloride: 106 mmol/L (ref 98–111)
Creatinine, Ser: 0.95 mg/dL (ref 0.44–1.00)
GFR calc Af Amer: >60 mL/min (ref >60)
GFR calc non Af Amer: 59 mL/min — ABNORMAL LOW (ref >60)
Glucose, Bld: 67 mg/dL — ABNORMAL LOW (ref 70–99)
Potassium: 4 mmol/L (ref 3.5–5.1)
Sodium: 138 mmol/L (ref 135–145)
Total Bilirubin: 0.5 mg/dL (ref 0.3–1.2)
Total Protein: 6.6 g/dL (ref 6.5–8.1)

## 2019-05-07 LAB — PROTIME-INR
INR: 0.9 (ref 0.8–1.2)
Prothrombin Time: 12.3 seconds (ref 11.4–15.2)

## 2019-05-07 LAB — URINALYSIS, ROUTINE W REFLEX MICROSCOPIC
Bilirubin Urine: NEGATIVE
Glucose, UA: NEGATIVE mg/dL
Hgb urine dipstick: NEGATIVE
Ketones, ur: NEGATIVE mg/dL
Nitrite: NEGATIVE
Protein, ur: NEGATIVE mg/dL
Specific Gravity, Urine: 1.017 (ref 1.005–1.030)
pH: 5 (ref 5.0–8.0)

## 2019-05-07 LAB — BLOOD GAS, ARTERIAL
Acid-base deficit: 1.1 mmol/L (ref 0.0–2.0)
Bicarbonate: 23 mmol/L (ref 20.0–28.0)
Drawn by: 42180
FIO2: 21
O2 Saturation: 98.7 %
Patient temperature: 37
pCO2 arterial: 37.9 mmHg (ref 32.0–48.0)
pH, Arterial: 7.4 (ref 7.350–7.450)
pO2, Arterial: 119 mmHg — ABNORMAL HIGH (ref 83.0–108.0)

## 2019-05-07 LAB — CBC
HCT: 39.1 % (ref 36.0–46.0)
Hemoglobin: 13 g/dL (ref 12.0–15.0)
MCH: 32.6 pg (ref 26.0–34.0)
MCHC: 33.2 g/dL (ref 30.0–36.0)
MCV: 98 fL (ref 80.0–100.0)
Platelets: 200 10*3/uL (ref 150–400)
RBC: 3.99 MIL/uL (ref 3.87–5.11)
RDW: 11.5 % (ref 11.5–15.5)
WBC: 7.4 10*3/uL (ref 4.0–10.5)
nRBC: 0 % (ref 0.0–0.2)

## 2019-05-07 LAB — APTT: aPTT: 28 seconds (ref 24–36)

## 2019-05-07 LAB — ABO/RH: ABO/RH(D): O POS

## 2019-05-07 LAB — SARS CORONAVIRUS 2 (TAT 6-24 HRS): SARS Coronavirus 2: NEGATIVE

## 2019-05-07 LAB — SURGICAL PCR SCREEN
MRSA, PCR: NEGATIVE
Staphylococcus aureus: NEGATIVE

## 2019-05-07 NOTE — Progress Notes (Signed)
PCP - Sarajane Jews Cardiologist - Shirlee More  Chest x-ray - 05/07/19 EKG - 05/07/19 Stress Test - denies ECHO - 03/08/19 Cardiac Cath - denies  Fasting Blood Sugar - 150s Checks Blood Sugar ___1__ times a day  Aspirin Instructions: patient stated her last dose was 11/17  COVID TEST- 11/17   Anesthesia review: yes, cardiac hx  Patient denies shortness of breath, fever, cough and chest pain at PAT appointment   All instructions explained to the patient, with a verbal understanding of the material. Patient agrees to go over the instructions while at home for a better understanding. Patient also instructed to self quarantine after being tested for COVID-19. The opportunity to ask questions was provided.

## 2019-05-08 LAB — HEMOGLOBIN A1C
Hgb A1c MFr Bld: 7 % — ABNORMAL HIGH (ref 4.8–5.6)
Mean Plasma Glucose: 154.2 mg/dL

## 2019-05-08 NOTE — Progress Notes (Signed)
Anesthesia Chart Review: Follows with Dr. Bettina Gavia for hx of difficult to control hypertension. Echo 03/08/19 showed EF 60 to 65% mild concentric LVH normal biatrial size and no significant valvular abnormality. She was last seen 04/18/19 and discussed upcoming surgery, "She has a lung tumor she tells me 2 cm should be seen by CT surgery and she expects to have surgical intervention.  I do not have a report but she told me a PET scan was hypermetabolic.  She has no history of cigarette smoking.  She is not having chest pain or shortness of breath or hemoptysis.  Home blood pressures have a low variability at times greater than 382 systolic at times less than 110 but the vast majority are at target 120-130/70.  No side effects from medications she has had no lightheadedness or syncope.  Her diabetes is controlled.  In my opinion she requires no preoperative cardiology evaluation and testing prior to lung surgery."  Preop labs reviewed, WNL. DMII well controlled A1c 7.0.  EKG 05/07/19: Sinus rhythm with Premature atrial complexes. Rate 60. LVH.. Nonspecific ST and T wave abnormality. No significant change since last tracing.  TTE 03/08/19:  1. Left ventricular ejection fraction, by visual estimation, is 60 to 65%. The left ventricle has normal function. Normal left ventricular size. Left ventricular septal wall thickness was mildly increased. Mildly increased left ventricular posterior  wall thickness. There is mildly increased left ventricular hypertrophy.  2. Left ventricular diastolic Doppler parameters are consistent with impaired relaxation pattern of LV diastolic filling.  3. Global right ventricle has normal systolic function.The right ventricular size is normal. No increase in right ventricular wall thickness.  4. Left atrial size was normal.  5. Right atrial size was normal.  6. The mitral valve is normal in structure. Trace mitral valve regurgitation. No evidence of mitral stenosis.  7. The  tricuspid valve is normal in structure. Tricuspid valve regurgitation is mild.  8. The aortic valve is tricuspid Aortic valve regurgitation is mild by color flow Doppler. Mild aortic valve sclerosis without stenosis.  9. The pulmonic valve was normal in structure. Pulmonic valve regurgitation is not visualized by color flow Doppler. 10. Normal pulmonary artery systolic pressure. 11. The inferior vena cava is normal in size with greater than 50% respiratory variability, suggesting right atrial pressure of 3 mmHg.   Wynonia Musty [facility] Short Stay Center/Anesthesiology Phone [phone] 05/08/2019 10:25 AM

## 2019-05-08 NOTE — Anesthesia Preprocedure Evaluation (Addendum)
Anesthesia Evaluation  Patient identified by MRN, date of birth, ID band Patient awake    Reviewed: Allergy & Precautions, H&P , NPO status , Patient's Chart, lab work & pertinent test results, reviewed documented beta blocker date and time   Airway Mallampati: II  TM Distance: >3 FB Neck ROM: Full    Dental no notable dental hx. (+) Teeth Intact, Dental Advisory Given   Pulmonary asthma ,    Pulmonary exam normal breath sounds clear to auscultation       Cardiovascular hypertension, Pt. on medications and Pt. on home beta blockers  Rhythm:Regular Rate:Normal     Neuro/Psych Anxiety negative neurological ROS     GI/Hepatic Neg liver ROS, hiatal hernia, GERD  Medicated,  Endo/Other  diabetes, Type 2, Oral Hypoglycemic Agents  Renal/GU negative Renal ROS  negative genitourinary   Musculoskeletal   Abdominal   Peds  Hematology negative hematology ROS (+)   Anesthesia Other Findings   Reproductive/Obstetrics negative OB ROS                           Anesthesia Physical Anesthesia Plan  ASA: III  Anesthesia Plan: General   Post-op Pain Management:    Induction: Intravenous  PONV Risk Score and Plan: 4 or greater and Ondansetron, Dexamethasone and Midazolam  Airway Management Planned: Double Lumen EBT  Additional Equipment: Arterial line, CVP and Ultrasound Guidance Line Placement  Intra-op Plan:   Post-operative Plan: Extubation in OR  Informed Consent: I have reviewed the patients History and Physical, chart, labs and discussed the procedure including the risks, benefits and alternatives for the proposed anesthesia with the patient or authorized representative who has indicated his/her understanding and acceptance.     Dental advisory given  Plan Discussed with: CRNA  Anesthesia Plan Comments: (Follows with Dr. Bettina Gavia for hx of difficult to control hypertension. Echo 03/08/19  showed EF 60 to 65% mild concentric LVH normal biatrial size and no significant valvular abnormality. She was last seen 04/18/19 and discussed upcoming surgery, "She has a lung tumor she tells me 2 cm should be seen by CT surgery and she expects to have surgical intervention.  I do not have a report but she told me a PET scan was hypermetabolic.  She has no history of cigarette smoking.  She is not having chest pain or shortness of breath or hemoptysis.  Home blood pressures have a low variability at times greater than 211 systolic at times less than 110 but the vast majority are at target 120-130/70.  No side effects from medications she has had no lightheadedness or syncope.  Her diabetes is controlled.  In my opinion she requires no preoperative cardiology evaluation and testing prior to lung surgery."  Preop labs reviewed, WNL. DMII well controlled A1c 7.0.  EKG 05/07/19: Sinus rhythm with Premature atrial complexes. Rate 60. LVH.. Nonspecific ST and T wave abnormality. No significant change since last tracing.  TTE 03/08/19:  1. Left ventricular ejection fraction, by visual estimation, is 60 to 65%. The left ventricle has normal function. Normal left ventricular size. Left ventricular septal wall thickness was mildly increased. Mildly increased left ventricular posterior  wall thickness. There is mildly increased left ventricular hypertrophy.  2. Left ventricular diastolic Doppler parameters are consistent with impaired relaxation pattern of LV diastolic filling.  3. Global right ventricle has normal systolic function.The right ventricular size is normal. No increase in right ventricular wall thickness.  4. Left atrial size  was normal.  5. Right atrial size was normal.  6. The mitral valve is normal in structure. Trace mitral valve regurgitation. No evidence of mitral stenosis.  7. The tricuspid valve is normal in structure. Tricuspid valve regurgitation is mild.  8. The aortic valve is tricuspid  Aortic valve regurgitation is mild by color flow Doppler. Mild aortic valve sclerosis without stenosis.  9. The pulmonic valve was normal in structure. Pulmonic valve regurgitation is not visualized by color flow Doppler. 10. Normal pulmonary artery systolic pressure. 11. The inferior vena cava is normal in size with greater than 50% respiratory variability, suggesting right atrial pressure of 3 mmHg. )       Anesthesia Quick Evaluation

## 2019-05-09 ENCOUNTER — Encounter (HOSPITAL_COMMUNITY): Payer: Self-pay

## 2019-05-09 ENCOUNTER — Encounter (HOSPITAL_COMMUNITY)
Admission: RE | Disposition: A | Discharge status: Home / Self Care | Payer: Self-pay | Source: Home / Self Care | Attending: Thoracic Surgery (Cardiothoracic Vascular Surgery)

## 2019-05-09 ENCOUNTER — Inpatient Hospital Stay (HOSPITAL_COMMUNITY): Payer: Medicare Other | Admitting: Physician Assistant

## 2019-05-09 ENCOUNTER — Other Ambulatory Visit: Payer: Self-pay

## 2019-05-09 ENCOUNTER — Inpatient Hospital Stay (HOSPITAL_COMMUNITY): Payer: Medicare Other

## 2019-05-09 ENCOUNTER — Inpatient Hospital Stay (HOSPITAL_COMMUNITY)
Admission: RE | Admit: 2019-05-09 | Discharge: 2019-05-15 | DRG: 164 | Disposition: A | Discharge status: Home / Self Care | Payer: Medicare Other | Attending: Thoracic Surgery (Cardiothoracic Vascular Surgery) | Admitting: Thoracic Surgery (Cardiothoracic Vascular Surgery)

## 2019-05-09 ENCOUNTER — Other Ambulatory Visit: Payer: Self-pay | Admitting: Family

## 2019-05-09 DIAGNOSIS — Z880 Allergy status to penicillin: Secondary | ICD-10-CM

## 2019-05-09 DIAGNOSIS — I10 Essential (primary) hypertension: Secondary | ICD-10-CM | POA: Diagnosis present

## 2019-05-09 DIAGNOSIS — E785 Hyperlipidemia, unspecified: Secondary | ICD-10-CM | POA: Diagnosis present

## 2019-05-09 DIAGNOSIS — J9382 Other air leak: Secondary | ICD-10-CM | POA: Diagnosis not present

## 2019-05-09 DIAGNOSIS — R911 Solitary pulmonary nodule: Secondary | ICD-10-CM

## 2019-05-09 DIAGNOSIS — I4891 Unspecified atrial fibrillation: Secondary | ICD-10-CM | POA: Diagnosis present

## 2019-05-09 DIAGNOSIS — Z902 Acquired absence of lung [part of]: Secondary | ICD-10-CM

## 2019-05-09 DIAGNOSIS — E119 Type 2 diabetes mellitus without complications: Secondary | ICD-10-CM | POA: Diagnosis present

## 2019-05-09 DIAGNOSIS — Z801 Family history of malignant neoplasm of trachea, bronchus and lung: Secondary | ICD-10-CM

## 2019-05-09 DIAGNOSIS — D62 Acute posthemorrhagic anemia: Secondary | ICD-10-CM | POA: Diagnosis not present

## 2019-05-09 DIAGNOSIS — J45909 Unspecified asthma, uncomplicated: Secondary | ICD-10-CM | POA: Diagnosis present

## 2019-05-09 DIAGNOSIS — K219 Gastro-esophageal reflux disease without esophagitis: Secondary | ICD-10-CM | POA: Diagnosis present

## 2019-05-09 DIAGNOSIS — I959 Hypotension, unspecified: Secondary | ICD-10-CM | POA: Diagnosis not present

## 2019-05-09 DIAGNOSIS — Z7982 Long term (current) use of aspirin: Secondary | ICD-10-CM

## 2019-05-09 DIAGNOSIS — K5900 Constipation, unspecified: Secondary | ICD-10-CM | POA: Diagnosis present

## 2019-05-09 DIAGNOSIS — Z79899 Other long term (current) drug therapy: Secondary | ICD-10-CM | POA: Diagnosis not present

## 2019-05-09 DIAGNOSIS — Z881 Allergy status to other antibiotic agents status: Secondary | ICD-10-CM

## 2019-05-09 DIAGNOSIS — C3411 Malignant neoplasm of upper lobe, right bronchus or lung: Secondary | ICD-10-CM | POA: Diagnosis present

## 2019-05-09 DIAGNOSIS — Z4682 Encounter for fitting and adjustment of non-vascular catheter: Secondary | ICD-10-CM

## 2019-05-09 DIAGNOSIS — Z7984 Long term (current) use of oral hypoglycemic drugs: Secondary | ICD-10-CM

## 2019-05-09 DIAGNOSIS — Z9889 Other specified postprocedural states: Secondary | ICD-10-CM

## 2019-05-09 DIAGNOSIS — Z20828 Contact with and (suspected) exposure to other viral communicable diseases: Secondary | ICD-10-CM | POA: Diagnosis present

## 2019-05-09 DIAGNOSIS — R002 Palpitations: Secondary | ICD-10-CM

## 2019-05-09 DIAGNOSIS — J939 Pneumothorax, unspecified: Secondary | ICD-10-CM

## 2019-05-09 HISTORY — PX: LOBECTOMY: SHX5089

## 2019-05-09 HISTORY — PX: VIDEO ASSISTED THORACOSCOPY (VATS)/WEDGE RESECTION: SHX6174

## 2019-05-09 HISTORY — PX: INTERCOSTAL NERVE BLOCK: SHX5021

## 2019-05-09 LAB — GLUCOSE, CAPILLARY
Glucose-Capillary: 135 mg/dL — ABNORMAL HIGH (ref 70–99)
Glucose-Capillary: 136 mg/dL — ABNORMAL HIGH (ref 70–99)
Glucose-Capillary: 197 mg/dL — ABNORMAL HIGH (ref 70–99)
Glucose-Capillary: 219 mg/dL — ABNORMAL HIGH (ref 70–99)

## 2019-05-09 LAB — PREPARE RBC (CROSSMATCH)

## 2019-05-09 SURGERY — VIDEO ASSISTED THORACOSCOPY (VATS)/WEDGE RESECTION
Anesthesia: General | Site: Chest | Laterality: Right

## 2019-05-09 MED ORDER — PHENYLEPHRINE HCL-NACL 10-0.9 MG/250ML-% IV SOLN
INTRAVENOUS | Status: DC | PRN
  Administered 2019-05-09: 20 ug/min via INTRAVENOUS

## 2019-05-09 MED ORDER — DIPHENHYDRAMINE HCL 12.5 MG/5ML PO ELIX
12.5000 mg | ORAL_SOLUTION | Freq: Four times a day (QID) | ORAL | Status: DC | PRN
  Administered 2019-05-10 – 2019-05-12 (×2): 12.5 mg via ORAL
  Filled 2019-05-09 (×3): qty 5

## 2019-05-09 MED ORDER — NALOXONE HCL 0.4 MG/ML IJ SOLN: 0.4000 mg | INTRAMUSCULAR | Status: DC | PRN

## 2019-05-09 MED ORDER — MIDAZOLAM HCL 2 MG/2ML IJ SOLN
1.0000 mg | Freq: Once | INTRAMUSCULAR | Status: AC
  Administered 2019-05-09: 13:00:00 1 mg via INTRAVENOUS

## 2019-05-09 MED ORDER — BUPIVACAINE HCL (PF) 0.5 % IJ SOLN
INTRAMUSCULAR | Status: DC | PRN
  Administered 2019-05-09: 30 mL

## 2019-05-09 MED ORDER — IPRATROPIUM-ALBUTEROL 0.5-2.5 (3) MG/3ML IN SOLN
3.0000 mL | Freq: Four times a day (QID) | RESPIRATORY_TRACT | Status: DC
  Administered 2019-05-09: 3 mL via RESPIRATORY_TRACT
  Filled 2019-05-09: qty 3

## 2019-05-09 MED ORDER — ACETAMINOPHEN 160 MG/5ML PO SOLN
1000.0000 mg | Freq: Four times a day (QID) | ORAL | Status: AC
  Administered 2019-05-09: 1000 mg via ORAL
  Filled 2019-05-09: qty 40.6

## 2019-05-09 MED ORDER — DEXAMETHASONE SODIUM PHOSPHATE 10 MG/ML IJ SOLN
INTRAMUSCULAR | Status: DC | PRN
  Administered 2019-05-09: 5 mg via INTRAVENOUS

## 2019-05-09 MED ORDER — DIPHENHYDRAMINE HCL 50 MG/ML IJ SOLN
12.5000 mg | Freq: Four times a day (QID) | INTRAMUSCULAR | Status: DC | PRN
  Filled 2019-05-09: qty 1

## 2019-05-09 MED ORDER — FENTANYL CITRATE (PF) 250 MCG/5ML IJ SOLN
INTRAMUSCULAR | Status: AC
  Filled 2019-05-09: qty 5

## 2019-05-09 MED ORDER — BISACODYL 5 MG PO TBEC
10.0000 mg | DELAYED_RELEASE_TABLET | Freq: Every day | ORAL | Status: DC
  Administered 2019-05-09 – 2019-05-12 (×4): 10 mg via ORAL
  Filled 2019-05-09 (×4): qty 2

## 2019-05-09 MED ORDER — ACETAMINOPHEN 500 MG PO TABS
1000.0000 mg | ORAL_TABLET | Freq: Four times a day (QID) | ORAL | Status: AC
  Administered 2019-05-10 – 2019-05-14 (×17): 1000 mg via ORAL
  Filled 2019-05-09 (×18): qty 2

## 2019-05-09 MED ORDER — FENTANYL CITRATE (PF) 100 MCG/2ML IJ SOLN
INTRAMUSCULAR | Status: AC
  Administered 2019-05-09: 50 ug via INTRAVENOUS
  Filled 2019-05-09: qty 2

## 2019-05-09 MED ORDER — MIDAZOLAM HCL 2 MG/2ML IJ SOLN
INTRAMUSCULAR | Status: DC | PRN
  Administered 2019-05-09: 2 mg via INTRAVENOUS

## 2019-05-09 MED ORDER — HYDROMORPHONE HCL 1 MG/ML IJ SOLN: 0.2500 mg | INTRAMUSCULAR | Status: DC | PRN

## 2019-05-09 MED ORDER — ATENOLOL 50 MG PO TABS
50.0000 mg | ORAL_TABLET | Freq: Every day | ORAL | Status: DC
  Administered 2019-05-10: 50 mg via ORAL
  Filled 2019-05-09: qty 1

## 2019-05-09 MED ORDER — MIDAZOLAM HCL 2 MG/2ML IJ SOLN
INTRAMUSCULAR | Status: AC
  Administered 2019-05-09: 1 mg via INTRAVENOUS
  Filled 2019-05-09: qty 2

## 2019-05-09 MED ORDER — PROPOFOL 10 MG/ML IV BOLUS
INTRAVENOUS | Status: AC
  Filled 2019-05-09: qty 20

## 2019-05-09 MED ORDER — ONDANSETRON HCL 4 MG/2ML IJ SOLN
INTRAMUSCULAR | Status: DC | PRN
  Administered 2019-05-09: 4 mg via INTRAVENOUS

## 2019-05-09 MED ORDER — MONTELUKAST SODIUM 10 MG PO TABS
10.0000 mg | ORAL_TABLET | Freq: Every day | ORAL | Status: DC
  Administered 2019-05-10 – 2019-05-14 (×5): 10 mg via ORAL
  Filled 2019-05-09 (×5): qty 1

## 2019-05-09 MED ORDER — SUGAMMADEX SODIUM 200 MG/2ML IV SOLN
INTRAVENOUS | Status: DC | PRN
  Administered 2019-05-09: 125 mg via INTRAVENOUS

## 2019-05-09 MED ORDER — PROPOFOL 10 MG/ML IV BOLUS
INTRAVENOUS | Status: DC | PRN
  Administered 2019-05-09: 100 mg via INTRAVENOUS

## 2019-05-09 MED ORDER — LIDOCAINE 2% (20 MG/ML) 5 ML SYRINGE
INTRAMUSCULAR | Status: AC
  Filled 2019-05-09: qty 5

## 2019-05-09 MED ORDER — ASPIRIN EC 81 MG PO TBEC
81.0000 mg | DELAYED_RELEASE_TABLET | Freq: Every day | ORAL | Status: DC
  Administered 2019-05-09 – 2019-05-15 (×7): 81 mg via ORAL
  Filled 2019-05-09 (×7): qty 1

## 2019-05-09 MED ORDER — SODIUM CHLORIDE 0.9 % IV SOLN: INTRAVENOUS | Status: DC | PRN

## 2019-05-09 MED ORDER — ACETAMINOPHEN 500 MG PO TABS
1000.0000 mg | ORAL_TABLET | Freq: Once | ORAL | Status: AC
  Administered 2019-05-09: 1000 mg via ORAL
  Filled 2019-05-09: qty 2

## 2019-05-09 MED ORDER — HYDROMORPHONE 1 MG/ML IV SOLN
INTRAVENOUS | Status: AC
  Administered 2019-05-09: 0.9 mg via INTRAVENOUS
  Filled 2019-05-09: qty 30

## 2019-05-09 MED ORDER — HYDROCHLOROTHIAZIDE 12.5 MG PO CAPS: 12.5000 mg | ORAL_CAPSULE | Freq: Every day | ORAL | Status: DC

## 2019-05-09 MED ORDER — FLUTICASONE PROPIONATE 50 MCG/ACT NA SUSP
1.0000 | Freq: Every day | NASAL | Status: DC | PRN
  Filled 2019-05-09: qty 16

## 2019-05-09 MED ORDER — OXYCODONE HCL 5 MG PO TABS
5.0000 mg | ORAL_TABLET | ORAL | Status: DC | PRN
  Administered 2019-05-11: 5 mg via ORAL
  Administered 2019-05-11 – 2019-05-13 (×6): 10 mg via ORAL
  Administered 2019-05-14 (×3): 5 mg via ORAL
  Filled 2019-05-09: qty 2
  Filled 2019-05-09: qty 1
  Filled 2019-05-09 (×3): qty 2
  Filled 2019-05-09 (×3): qty 1
  Filled 2019-05-09 (×2): qty 2

## 2019-05-09 MED ORDER — ONDANSETRON HCL 4 MG/2ML IJ SOLN
INTRAMUSCULAR | Status: AC
  Filled 2019-05-09: qty 2

## 2019-05-09 MED ORDER — OLMESARTAN MEDOXOMIL-HCTZ 20-12.5 MG PO TABS: 1.0000 | ORAL_TABLET | Freq: Every day | ORAL | Status: DC

## 2019-05-09 MED ORDER — IRBESARTAN 150 MG PO TABS: 150.0000 mg | ORAL_TABLET | Freq: Every day | ORAL | Status: DC

## 2019-05-09 MED ORDER — SENNOSIDES-DOCUSATE SODIUM 8.6-50 MG PO TABS
1.0000 | ORAL_TABLET | Freq: Every day | ORAL | Status: DC
  Administered 2019-05-10 – 2019-05-13 (×4): 1 via ORAL
  Filled 2019-05-09 (×6): qty 1

## 2019-05-09 MED ORDER — ALBUMIN HUMAN 5 % IV SOLN
INTRAVENOUS | Status: DC | PRN
  Administered 2019-05-09: 16:00:00 via INTRAVENOUS

## 2019-05-09 MED ORDER — LACTATED RINGERS IV SOLN
INTRAVENOUS | Status: DC
  Administered 2019-05-09: 10:00:00 via INTRAVENOUS

## 2019-05-09 MED ORDER — MIDAZOLAM HCL 2 MG/2ML IJ SOLN
INTRAMUSCULAR | Status: AC
  Filled 2019-05-09: qty 2

## 2019-05-09 MED ORDER — ATORVASTATIN CALCIUM 10 MG PO TABS
10.0000 mg | ORAL_TABLET | Freq: Every day | ORAL | Status: DC
  Administered 2019-05-10 – 2019-05-14 (×5): 10 mg via ORAL
  Filled 2019-05-09 (×6): qty 1

## 2019-05-09 MED ORDER — ROCURONIUM BROMIDE 10 MG/ML (PF) SYRINGE
PREFILLED_SYRINGE | INTRAVENOUS | Status: AC
  Filled 2019-05-09: qty 10

## 2019-05-09 MED ORDER — MIDAZOLAM HCL 2 MG/2ML IJ SOLN
1.0000 mg | Freq: Once | INTRAMUSCULAR | Status: AC
  Administered 2019-05-09: 10:00:00 1 mg via INTRAVENOUS

## 2019-05-09 MED ORDER — HYDROMORPHONE 1 MG/ML IV SOLN
INTRAVENOUS | Status: AC
  Administered 2019-05-09: 30 mg via INTRAVENOUS
  Administered 2019-05-10: 1.5 mg via INTRAVENOUS
  Administered 2019-05-10: 2.1 mg via INTRAVENOUS
  Administered 2019-05-10: 1.5 mg via INTRAVENOUS
  Administered 2019-05-10 (×2): 0 mg via INTRAVENOUS
  Administered 2019-05-10: 3 mg via INTRAVENOUS
  Administered 2019-05-10: 2.7 mg via INTRAVENOUS
  Administered 2019-05-11 (×2): 0.6 mg via INTRAVENOUS
  Administered 2019-05-11: 0 mg via INTRAVENOUS
  Administered 2019-05-11: 2.1 mg via INTRAVENOUS
  Administered 2019-05-11: 0.3 mg via INTRAVENOUS
  Administered 2019-05-11: 0 mg via INTRAVENOUS
  Administered 2019-05-12: 0.3 mg via INTRAVENOUS
  Administered 2019-05-12 – 2019-05-13 (×6): 0 mg via INTRAVENOUS

## 2019-05-09 MED ORDER — BUPIVACAINE LIPOSOME 1.3 % IJ SUSP: INTRAMUSCULAR | Status: DC | PRN

## 2019-05-09 MED ORDER — FENTANYL CITRATE (PF) 100 MCG/2ML IJ SOLN
50.0000 ug | Freq: Once | INTRAMUSCULAR | Status: AC
  Administered 2019-05-09: 10:00:00 50 ug via INTRAVENOUS

## 2019-05-09 MED ORDER — SODIUM CHLORIDE 0.9% FLUSH: 9.0000 mL | INTRAVENOUS | Status: DC | PRN

## 2019-05-09 MED ORDER — INSULIN ASPART 100 UNIT/ML ~~LOC~~ SOLN
0.0000 [IU] | Freq: Three times a day (TID) | SUBCUTANEOUS | Status: DC
  Administered 2019-05-10: 8 [IU] via SUBCUTANEOUS

## 2019-05-09 MED ORDER — SODIUM CHLORIDE (PF) 0.9 % IJ SOLN
INTRAMUSCULAR | Status: DC | PRN
  Administered 2019-05-09: 50 mL via INTRAVENOUS

## 2019-05-09 MED ORDER — SODIUM CHLORIDE 0.9 % IV SOLN
INTRAVENOUS | Status: DC
  Administered 2019-05-09 (×2): via INTRAVENOUS

## 2019-05-09 MED ORDER — BUPIVACAINE LIPOSOME 1.3 % IJ SUSP
20.0000 mL | INTRAMUSCULAR | Status: AC
  Administered 2019-05-09: 20 mL
  Filled 2019-05-09: qty 20

## 2019-05-09 MED ORDER — FENTANYL CITRATE (PF) 250 MCG/5ML IJ SOLN
INTRAMUSCULAR | Status: DC | PRN
  Administered 2019-05-09 (×3): 50 ug via INTRAVENOUS

## 2019-05-09 MED ORDER — DEXAMETHASONE SODIUM PHOSPHATE 10 MG/ML IJ SOLN
INTRAMUSCULAR | Status: AC
  Filled 2019-05-09: qty 1

## 2019-05-09 MED ORDER — ONDANSETRON HCL 4 MG/2ML IJ SOLN
4.0000 mg | Freq: Four times a day (QID) | INTRAMUSCULAR | Status: DC | PRN
  Administered 2019-05-13 – 2019-05-14 (×2): 4 mg via INTRAVENOUS
  Filled 2019-05-09 (×3): qty 2

## 2019-05-09 MED ORDER — SODIUM CHLORIDE 0.9% IV SOLUTION: Freq: Once | INTRAVENOUS | Status: DC

## 2019-05-09 MED ORDER — PHENYLEPHRINE 40 MCG/ML (10ML) SYRINGE FOR IV PUSH (FOR BLOOD PRESSURE SUPPORT)
PREFILLED_SYRINGE | INTRAVENOUS | Status: AC
  Filled 2019-05-09: qty 10

## 2019-05-09 MED ORDER — VANCOMYCIN HCL IN DEXTROSE 1-5 GM/200ML-% IV SOLN
1000.0000 mg | INTRAVENOUS | Status: AC
  Administered 2019-05-09: 1000 mg via INTRAVENOUS

## 2019-05-09 MED ORDER — ROCURONIUM BROMIDE 10 MG/ML (PF) SYRINGE
PREFILLED_SYRINGE | INTRAVENOUS | Status: DC | PRN
  Administered 2019-05-09: 10 mg via INTRAVENOUS
  Administered 2019-05-09: 20 mg via INTRAVENOUS
  Administered 2019-05-09: 50 mg via INTRAVENOUS

## 2019-05-09 SURGICAL SUPPLY — 106 items
ADH SKN CLS APL DERMABOND .7 (GAUZE/BANDAGES/DRESSINGS) ×2
BAG SPEC RTRVL LRG 6X4 10 (ENDOMECHANICALS) ×2
BLADE CLIPPER SURG (BLADE) ×3 IMPLANT
CANISTER SUCT 3000ML PPV (MISCELLANEOUS) ×6 IMPLANT
CATH THORACIC 28FR (CATHETERS) ×1 IMPLANT
CATH THORACIC 36FR (CATHETERS) IMPLANT
CATH THORACIC 36FR RT ANG (CATHETERS) IMPLANT
CLIP VESOCCLUDE MED 24/CT (CLIP) ×1 IMPLANT
CLIP VESOCCLUDE MED 6/CT (CLIP) ×3 IMPLANT
CONN ST 1/4X3/8  BEN (MISCELLANEOUS)
CONN ST 1/4X3/8 BEN (MISCELLANEOUS) IMPLANT
CONN Y 3/8X3/8X3/8  BEN (MISCELLANEOUS)
CONN Y 3/8X3/8X3/8 BEN (MISCELLANEOUS) IMPLANT
CONT SPEC 4OZ CLIKSEAL STRL BL (MISCELLANEOUS) ×20 IMPLANT
COVER SURGICAL LIGHT HANDLE (MISCELLANEOUS) ×3 IMPLANT
COVER WAND RF STERILE (DRAPES) ×2 IMPLANT
CUTTER ECHEON FLEX ENDO 45 340 (ENDOMECHANICALS) ×1 IMPLANT
DEFOGGER SCOPE WARMER CLEARIFY (MISCELLANEOUS) IMPLANT
DERMABOND ADVANCED (GAUZE/BANDAGES/DRESSINGS) ×1
DERMABOND ADVANCED .7 DNX12 (GAUZE/BANDAGES/DRESSINGS) ×2 IMPLANT
DRAIN CHANNEL 28F RND 3/8 FF (WOUND CARE) IMPLANT
DRAIN CHANNEL 32F RND 10.7 FF (WOUND CARE) IMPLANT
DRAPE CV SPLIT W-CLR ANES SCRN (DRAPES) ×3 IMPLANT
DRAPE ORTHO SPLIT 77X108 STRL (DRAPES) ×6
DRAPE SURG ORHT 6 SPLT 77X108 (DRAPES) ×2 IMPLANT
DRAPE WARM FLUID 44X44 (DRAPES) ×3 IMPLANT
ELECT BLADE 6.5 EXT (BLADE) ×4 IMPLANT
ELECT REM PT RETURN 9FT ADLT (ELECTROSURGICAL) ×3
ELECTRODE REM PT RTRN 9FT ADLT (ELECTROSURGICAL) ×2 IMPLANT
FELT TEFLON 1X6 (MISCELLANEOUS) ×1 IMPLANT
GAUZE SPONGE 4X4 12PLY STRL (GAUZE/BANDAGES/DRESSINGS) IMPLANT
GAUZE SPONGE 4X4 12PLY STRL LF (GAUZE/BANDAGES/DRESSINGS) ×1 IMPLANT
GLOVE SURG SIGNA 7.5 PF LTX (GLOVE) ×6 IMPLANT
GOWN STRL REUS W/ TWL LRG LVL3 (GOWN DISPOSABLE) ×4 IMPLANT
GOWN STRL REUS W/ TWL XL LVL3 (GOWN DISPOSABLE) ×4 IMPLANT
GOWN STRL REUS W/TWL LRG LVL3 (GOWN DISPOSABLE) ×6
GOWN STRL REUS W/TWL XL LVL3 (GOWN DISPOSABLE) ×6
HEMOSTAT SURGICEL 2X14 (HEMOSTASIS) IMPLANT
KIT BASIN OR (CUSTOM PROCEDURE TRAY) ×3 IMPLANT
KIT SUCTION CATH 14FR (SUCTIONS) ×3 IMPLANT
KIT TURNOVER KIT B (KITS) ×3 IMPLANT
MARKER SKIN DUAL TIP RULER LAB (MISCELLANEOUS) ×1 IMPLANT
NDL SPNL 18GX3.5 QUINCKE PK (NEEDLE) IMPLANT
NEEDLE SPNL 18GX3.5 QUINCKE PK (NEEDLE) IMPLANT
NS IRRIG 1000ML POUR BTL (IV SOLUTION) ×9 IMPLANT
PACK CHEST (CUSTOM PROCEDURE TRAY) ×3 IMPLANT
PAD ARMBOARD 7.5X6 YLW CONV (MISCELLANEOUS) ×6 IMPLANT
PENCIL BUTTON HOLSTER BLD 10FT (ELECTRODE) ×1 IMPLANT
POUCH ENDO CATCH II 15MM (MISCELLANEOUS) ×1 IMPLANT
POUCH SPECIMEN RETRIEVAL 10MM (ENDOMECHANICALS) ×1 IMPLANT
RELOAD STAPLE 35X2.5 WHT THIN (STAPLE) IMPLANT
RELOAD STAPLE 45 3.5 BLU ETS (ENDOMECHANICALS) IMPLANT
RELOAD STAPLE 45 4.1 GRN THCK (STAPLE) IMPLANT
RELOAD STAPLE 45 GOLD REG/THCK (STAPLE) IMPLANT
RELOAD STAPLE TA45 3.5 REG BLU (ENDOMECHANICALS) ×3 IMPLANT
SCISSORS LAP 5X35 DISP (ENDOMECHANICALS) IMPLANT
SEALANT PROGEL (MISCELLANEOUS) IMPLANT
SEALANT SURG COSEAL 4ML (VASCULAR PRODUCTS) IMPLANT
SEALANT SURG COSEAL 8ML (VASCULAR PRODUCTS) IMPLANT
SHEARS HARMONIC HDI 20CM (ELECTROSURGICAL) ×1 IMPLANT
SOL ANTI FOG 6CC (MISCELLANEOUS) ×2 IMPLANT
SOLUTION ANTI FOG 6CC (MISCELLANEOUS) ×1
SPECIMEN JAR MEDIUM (MISCELLANEOUS) ×3 IMPLANT
SPONGE INTESTINAL PEANUT (DISPOSABLE) ×5 IMPLANT
SPONGE TONSIL TAPE 1 RFD (DISPOSABLE) ×3 IMPLANT
STAPLE RELOAD 2.5MM WHITE (STAPLE) ×12 IMPLANT
STAPLE RELOAD 45 GRN (STAPLE) ×4 IMPLANT
STAPLE RELOAD 45MM GOLD (STAPLE) ×45 IMPLANT
STAPLE RELOAD 45MM GREEN (STAPLE) ×6
STAPLER ENDO NO KNIFE (STAPLE) ×1 IMPLANT
STAPLER VASCULAR ECHELON 35 (CUTTER) ×1 IMPLANT
STOPCOCK 4 WAY LG BORE MALE ST (IV SETS) ×3 IMPLANT
SUT PROLENE 4 0 RB 1 (SUTURE) ×9
SUT PROLENE 4-0 RB1 .5 CRCL 36 (SUTURE) IMPLANT
SUT SILK  1 MH (SUTURE) ×4
SUT SILK 1 MH (SUTURE) ×4 IMPLANT
SUT SILK 1 TIES 10X30 (SUTURE) ×3 IMPLANT
SUT SILK 2 0 SH (SUTURE) IMPLANT
SUT SILK 2 0SH CR/8 30 (SUTURE) IMPLANT
SUT SILK 3 0 SH 30 (SUTURE) IMPLANT
SUT SILK 3 0SH CR/8 30 (SUTURE) ×3 IMPLANT
SUT VIC AB 0 CTX 27 (SUTURE) IMPLANT
SUT VIC AB 1 CTX 27 (SUTURE) ×4 IMPLANT
SUT VIC AB 2-0 CT1 27 (SUTURE) ×3
SUT VIC AB 2-0 CT1 TAPERPNT 27 (SUTURE) IMPLANT
SUT VIC AB 2-0 CTX 36 (SUTURE) ×6 IMPLANT
SUT VIC AB 3-0 MH 27 (SUTURE) IMPLANT
SUT VIC AB 3-0 SH 27 (SUTURE)
SUT VIC AB 3-0 SH 27X BRD (SUTURE) IMPLANT
SUT VIC AB 3-0 X1 27 (SUTURE) ×4 IMPLANT
SUT VICRYL 0 UR6 27IN ABS (SUTURE) IMPLANT
SUT VICRYL 2 TP 1 (SUTURE) IMPLANT
SYR 10ML LL (SYRINGE) ×3 IMPLANT
SYR 20ML LL LF (SYRINGE) ×3 IMPLANT
SYR 30ML LL (SYRINGE) ×3 IMPLANT
SYR 50ML LL SCALE MARK (SYRINGE) ×3 IMPLANT
SYSTEM SAHARA CHEST DRAIN ATS (WOUND CARE) ×3 IMPLANT
TAPE CLOTH SURG 4X10 WHT LF (GAUZE/BANDAGES/DRESSINGS) ×1 IMPLANT
TIP APPLICATOR SPRAY EXTEND 16 (VASCULAR PRODUCTS) IMPLANT
TOWEL GREEN STERILE (TOWEL DISPOSABLE) ×3 IMPLANT
TOWEL GREEN STERILE FF (TOWEL DISPOSABLE) ×3 IMPLANT
TRAY FOLEY MTR SLVR 16FR STAT (SET/KITS/TRAYS/PACK) ×3 IMPLANT
TROCAR XCEL BLADELESS 5X75MML (TROCAR) ×4 IMPLANT
TROCAR XCEL NON-BLD 5MMX100MML (ENDOMECHANICALS) IMPLANT
TUBING EXTENTION W/L.L. (IV SETS) ×3 IMPLANT
WATER STERILE IRR 1000ML POUR (IV SOLUTION) ×6 IMPLANT

## 2019-05-09 NOTE — Brief Op Note (Addendum)
05/09/2019  5:16 PM  PATIENT:  Soren Hibner  73 y.o. female  PRE-OPERATIVE DIAGNOSIS:  RIGHT UPPER LOBE NODULE, SUSPECTED T1N0, STAGE IA NON-SMALL CELL CARCINOMA  POST-OPERATIVE DIAGNOSIS:  NON-SMALL CARCINOMA RIGHT UPPER LOBE- CLINICAL STAGE IA(T1,N0)  PROCEDURE:  Procedure(s): RIGHT VIDEO ASSISTED THORACOSCOPY  WEDGE RESECTION RIGHT UPPER LOBE RIGHT UPPER LOBECTOMY  LYMPH NODE DISSECTION Intercostal Nerve Block thoracic levels 3-10  SURGEON:  Melrose Nakayama, MD   PHYSICIAN ASSISTANT: Enid Cutter, PA  ANESTHESIA:   general  EBL:  250 mL   BLOOD ADMINISTERED:none  DRAINS:  Right 28Fr. pleural tube x1   LOCAL MEDICATIONS USED:  BUPIVICAINE   SPECIMEN:  Source of Specimen:  Right upper lobe lung  DISPOSITION OF SPECIMEN:  PATHOLOGY  COUNTS:  YES  DICTATION:-  PLAN OF CARE: Admit to inpatient   PATIENT DISPOSITION:  PACU - hemodynamically stable.   Delay start of Pharmacological VTE agent (>24hrs) due to surgical blood loss or risk of bleeding: yes

## 2019-05-09 NOTE — Progress Notes (Signed)
Pt admitted from PACU. Alert/oriented x4. CT to -20 suction output 180cc. A-line in place, vitals stable. Encourage pt to use PCA for pain control. Will continue to monitor pt.

## 2019-05-09 NOTE — Transfer of Care (Signed)
Immediate Anesthesia Transfer of Care Note  Patient: Holly Harper  Procedure(s) Performed: VIDEO ASSISTED THORACOSCOPY (VATS)/WEDGE RESECTION FOR FROZEN SECTIONS  (Right Chest) LOBECTOMY (Right ) Intercostal Nerve Block thoracic (Right )  Patient Location: PACU  Anesthesia Type:General  Level of Consciousness: awake, alert , oriented and patient cooperative  Airway & Oxygen Therapy: Patient Spontanous Breathing and Patient connected to face mask oxygen  Post-op Assessment: Report given to RN and Post -op Vital signs reviewed and stable  Post vital signs: Reviewed and stable  Last Vitals:  Vitals Value Taken Time  BP 115/61 05/09/19 1735  Temp    Pulse 64 05/09/19 1737  Resp 10 05/09/19 1736  SpO2 100 % 05/09/19 1737  Vitals shown include unvalidated device data.  Last Pain:  Vitals:   05/09/19 0928  TempSrc:   PainSc: 0-No pain         Complications: No apparent anesthesia complications

## 2019-05-09 NOTE — Op Note (Signed)
NAMESHELLI, PORTILLA MEDICAL RECORD [id number] ACCOUNT [id number] DATE OF BIRTH:September 06, 1945 FACILITY: [facility] LOCATION: [facility] PHYSICIAN:Storm Dulski C. Makinzee Durley, MD  OPERATIVE REPORT  DATE OF PROCEDURE:  05/09/2019  PREOPERATIVE DIAGNOSIS:  Right upper lobe lung nodule, suspected T1, N0, stage IA nonsmall-cell carcinoma.  POSTOPERATIVE DIAGNOSIS:  Nonsmall cell carcinoma, right upper lobe, clinical stage IA (T1, N0).  PROCEDURES:   1.  Right video-assisted thoracoscopy. 2.  Wedge resection right upper lobe nodule. 3.  Right upper lobectomy. 4.  Lymph node dissection. 5.  Intercostal nerve blocks at levels 3 through 10.  SURGEON:  Modesto Charon, MD  ASSISTANT:  Enid Cutter, PA  ANESTHESIA:  General.  FINDINGS:  Nodule clearly palpable in posterior aspect of right upper lobe.  Frozen section revealed nonsmall cell carcinoma.  Bronchial margin negative for tumor.  Multiple benign-appearing lymph nodes.  CLINICAL NOTE:  The patient is a 73 year old woman who presented with a chronic cough and throat clearing.  A chest x-ray was done to evaluate and she was found to have a lung nodule.  On CT, she had a 2.4 x 1.7 x 1.8 cm spiculated right upper lobe mass.   On PET CT, the nodule was hypermetabolic with an SUV of 4.5.  There was no evidence of regional or distant metastatic disease.  She was referred for consideration for surgical resection.  The indications, risks, benefits and alternatives were discussed  in detail with the patient.  She understood and accepted the risks and agreed to proceed.  OPERATIVE NOTE:  The patient was brought to the preoperative holding area on 05/09/2019.  The anesthesia service placed a central venous catheter and an arterial blood pressure monitoring line.  She was taken to the operating room, anesthetized and  intubated with a double lumen endotracheal tube.  Intravenous antibiotics were administered.  Sequential compression devices were placed on  the calves for DVT prophylaxis.  A Foley catheter was placed.  She was placed in a left lateral decubitus position  and the right chest was prepped and draped in the usual sterile fashion.  Single lung ventilation of the right lung was initiated and was tolerated well throughout the procedure.  A timeout was performed.  A solution containing 20 mL of liposomal bupivacaine, 30 mL of 0.5% bupivacaine and 50 mL of saline was prepared.  This was used for local at the incisions, as well as the intercostal nerve blocks.  After injecting local  anesthesia, an incision was made in the 8th interspace in the midaxillary line.  A 5 mm port was inserted into the chest.  The thoracoscope was advanced into the chest.  There was good isolation of the right lung, but there were thin filmy adhesions of  the right upper lobe to the chest wall.  A 5 cm working incision was made in the 4th interspace anterolaterally.  No rib spreading was performed during the procedure.  The adhesions were taken down with cautery.  Superiorly, some of the pleural fat came  with the upper lobe and there was some bleeding from the vessels in the chest wall.  This ultimately stopped without issue.  The nodule was palpable in the posterior aspect of the right upper lobe.  A wedge resection was performed with sequential firings  of an Echelon 45 mm stapler using gold cartridges.  The specimen was placed into an endoscopic retrieval bag, removed and sent for frozen section.  While awaiting the results of the frozen section, intercostal nerve blocks were performed from the 3rd  to  the 10th interspace by injecting 10 mL of the bupivacaine solution into a subpleural plane at each level.  The frozen resection returned showing nonsmall cell carcinoma and the decision was made to proceed with lobectomy as discussed with the patient preoperatively.  The inferior ligament was divided using the Harmonic scalpel.  A level 9 lymph node was  removed.  All  lymph nodes that were encountered during the dissection were removed and sent as separate specimens for permanent pathology.  All appeared benign.  The pleural reflection was divided at the hilum posteriorly and then anteriorly and  superiorly.  The minor fissure was relatively complete posteriorly near the confluence of the fissures, as was the major fissure.  The pulmonary artery was identified at the confluence of the fissures.  The superior segmental branch of the lower lobe was  identified, as was the basilar segmental trunk.  The  posterior ascending branch could not be identified.  Dissection was carried anteriorly.  The middle lobe pulmonary vein was identified and preserved.  The upper lobe branches of the superior  pulmonary vein then were dissected out, encircled and divided with endoscopic vascular stapler.  A second port incision was made anterior to the first one for placement of the stapler to get the appropriate angle.  Dissection was then carried superiorly  and the anterior apical vessels arose as a common trunk.  This was a rather large trunk.  The surrounding lymph nodes were dissected off and the trunk was divided with the endoscopic vascular stapler.  Completion of the minor fissure then was begun with  sequential firings of the Echelon stapler using gold cartridges.  As the dissection was carried along the main pulmonary artery, the posterior ascending branch was identified.  It was anterior to the right upper lobe bronchus.  After completion of the  minor fissure, the posterior ascending branch was dissected free of surrounding tissue.  In order to get a good angle on the artery for placement of the stapler, the scope was removed and placed through the anterior port incision and then the vascular  stapler was advanced through the more posterior port incision.  The stapler was placed across the artery and fired, transecting the posterior ascending branch.  Multiple nodes surrounded the  right upper lobe bronchus and these were brought out with the  specimen.  The bronchus was isolated and an Echelon stapler with a 45 mm green cartridge was placed across the right upper lobe bronchus and closed.  A test inflation showed good aeration of the lower and middle lobes.  The stapler was fired, transecting  the bronchus.  The last remnant of the major fissure posteriorly between the superior segment and the upper lobe was then divided with the Echelon stapler.  The middle lobe was tacked to the lower lobe with a no-knife stapler to prevent torsion.  The  right upper lobe was placed into an endoscopic retrieval bag, removed and sent for frozen section of the bronchial margin, which subsequently returned with no tumor seen.  While awaiting the frozen section, the azygos vein was elevated and level 4R and  2R nodes were removed.  These were sent for permanent pathology.  The chest was copiously irrigated with warm saline.  A test inflation to 30 cm of water revealed no leakage from the bronchial stump.  A 28-French chest tube was placed through the  anterior port incision and secured to skin with a #1 silk suture.  The working incision  was closed with #1 Vicryl fascial suture, followed by 2-0 Vicryl subcutaneous suture and 3-0 Vicryl subcuticular suture.  The posterior port incision was closed with  a 3-0 Vicryl subcuticular suture.  The chest tube was placed to suction.  The patient was placed back in a supine position.  She then was extubated in the operating room and taken to the postanesthetic care unit in good condition.  VN/NUANCE  D:05/09/2019 T:05/09/2019 JOB:[id number]

## 2019-05-09 NOTE — Interval H&P Note (Signed)
History and Physical Interval Note:  05/09/2019 5:08 PM  Holly Harper  has presented today for surgery, with the diagnosis of RIGHT UPPER LOBE NODULE.  The various methods of treatment have been discussed with the patient and family. After consideration of risks, benefits and other options for treatment, the patient has consented to  Procedure(s): VIDEO ASSISTED THORACOSCOPY (VATS)/WEDGE RESECTION FOR FROZEN SECTIONS  (Right) LOBECTOMY (Right) Intercostal Nerve Block thoracic (Right) as a surgical intervention.  The patient's history has been reviewed, patient examined, no change in status, stable for surgery.  I have reviewed the patient's chart and labs.  Questions were answered to the patient's satisfaction.     Melrose Nakayama

## 2019-05-09 NOTE — Anesthesia Procedure Notes (Signed)
Arterial Line Insertion Start/End11/19/2020 10:15 AM, 05/09/2019 10:20 AM Performed by: Wilburn Cornelia, CRNA, CRNA  Patient location: Pre-op. Preanesthetic checklist: patient identified, IV checked, site marked, risks and benefits discussed, surgical consent, monitors and equipment checked, pre-op evaluation, timeout performed and anesthesia consent Lidocaine 1% used for infiltration and patient sedated Left, radial was placed Catheter size: 20 G Hand hygiene performed  and maximum sterile barriers used  Allen's test indicative of satisfactory collateral circulation Attempts: 2 Procedure performed without using ultrasound guided technique. Following insertion, Biopatch and dressing applied. Post procedure assessment: normal

## 2019-05-09 NOTE — Anesthesia Procedure Notes (Signed)
Central Venous Catheter Insertion Performed by: Roderic Palau, MD, anesthesiologist Start/End11/19/2020 9:50 AM, 05/09/2019 10:00 AM Patient location: Pre-op. Preanesthetic checklist: patient identified, IV checked, site marked, risks and benefits discussed, surgical consent, monitors and equipment checked, pre-op evaluation, timeout performed and anesthesia consent Position: Trendelenburg Lidocaine 1% used for infiltration and patient sedated Hand hygiene performed , maximum sterile barriers used  and Seldinger technique used Catheter size: 8 Fr Total catheter length 16. Central line was placed.Double lumen Procedure performed using ultrasound guided technique. Ultrasound Notes:anatomy identified, needle tip was noted to be adjacent to the nerve/plexus identified, no ultrasound evidence of intravascular and/or intraneural injection and image(s) printed for medical record Attempts: 1 Following insertion, dressing applied, line sutured and Biopatch. Post procedure assessment: blood return through all ports  Patient tolerated the procedure well with no immediate complications.

## 2019-05-09 NOTE — Anesthesia Procedure Notes (Addendum)
Procedure Name: Intubation Date/Time: 05/09/2019 2:20 PM Performed by: Oletta Lamas, CRNA Pre-anesthesia Checklist: Patient identified, Emergency Drugs available, Suction available and Patient being monitored Patient Re-evaluated:Patient Re-evaluated prior to induction Oxygen Delivery Method: Circle System Utilized Preoxygenation: Pre-oxygenation with 100% oxygen Induction Type: IV induction and Cricoid Pressure applied Ventilation: Mask ventilation without difficulty Laryngoscope Size: Miller and 2 Grade View: Grade I Tube type: Oral Endobronchial tube: Left and 35 Fr Number of attempts: 1 Airway Equipment and Method: Stylet Placement Confirmation: ETT inserted through vocal cords under direct vision,  positive ETCO2 and breath sounds checked- equal and bilateral Secured at: 29 cm Tube secured with: Tape Dental Injury: Teeth and Oropharynx as per pre-operative assessment  Comments: vivasite tube utilized

## 2019-05-10 ENCOUNTER — Inpatient Hospital Stay (HOSPITAL_COMMUNITY): Payer: Medicare Other

## 2019-05-10 ENCOUNTER — Encounter (HOSPITAL_COMMUNITY): Payer: Self-pay | Admitting: Thoracic Surgery (Cardiothoracic Vascular Surgery)

## 2019-05-10 LAB — BASIC METABOLIC PANEL
Anion gap: 10 (ref 5–15)
BUN: 13 mg/dL (ref 8–23)
CO2: 21 mmol/L — ABNORMAL LOW (ref 22–32)
Calcium: 8.5 mg/dL — ABNORMAL LOW (ref 8.9–10.3)
Chloride: 103 mmol/L (ref 98–111)
Creatinine, Ser: 0.74 mg/dL (ref 0.44–1.00)
GFR calc Af Amer: >60 mL/min (ref >60)
GFR calc non Af Amer: >60 mL/min (ref >60)
Glucose, Bld: 161 mg/dL — ABNORMAL HIGH (ref 70–99)
Potassium: 4.5 mmol/L (ref 3.5–5.1)
Sodium: 134 mmol/L — ABNORMAL LOW (ref 135–145)

## 2019-05-10 LAB — BLOOD GAS, ARTERIAL
Acid-base deficit: 0.6 mmol/L (ref 0.0–2.0)
Bicarbonate: 23.6 mmol/L (ref 20.0–28.0)
Drawn by: 51374
FIO2: 21
O2 Saturation: 98.4 %
Patient temperature: 37
pCO2 arterial: 38.7 mmHg (ref 32.0–48.0)
pH, Arterial: 7.402 (ref 7.350–7.450)
pO2, Arterial: 97.9 mmHg (ref 83.0–108.0)

## 2019-05-10 LAB — GLUCOSE, CAPILLARY
Glucose-Capillary: 146 mg/dL — ABNORMAL HIGH (ref 70–99)
Glucose-Capillary: 140 mg/dL — ABNORMAL HIGH (ref 70–99)
Glucose-Capillary: 176 mg/dL — ABNORMAL HIGH (ref 70–99)
Glucose-Capillary: 221 mg/dL — ABNORMAL HIGH (ref 70–99)
Glucose-Capillary: 127 mg/dL — ABNORMAL HIGH (ref 70–99)
Glucose-Capillary: 226 mg/dL — ABNORMAL HIGH (ref 70–99)

## 2019-05-10 LAB — CBC
HCT: 27.5 % — ABNORMAL LOW (ref 36.0–46.0)
Hemoglobin: 9.3 g/dL — ABNORMAL LOW (ref 12.0–15.0)
MCH: 33.1 pg (ref 26.0–34.0)
MCHC: 33.8 g/dL (ref 30.0–36.0)
MCV: 97.9 fL (ref 80.0–100.0)
Platelets: 175 10*3/uL (ref 150–400)
RBC: 2.81 MIL/uL — ABNORMAL LOW (ref 3.87–5.11)
RDW: 11.4 % — ABNORMAL LOW (ref 11.5–15.5)
WBC: 10.1 10*3/uL (ref 4.0–10.5)
nRBC: 0 % (ref 0.0–0.2)

## 2019-05-10 MED ORDER — MELATONIN 3 MG PO TABS
3.0000 mg | ORAL_TABLET | Freq: Every day | ORAL | Status: DC
  Administered 2019-05-10 – 2019-05-14 (×5): 3 mg via ORAL
  Filled 2019-05-10 (×5): qty 1

## 2019-05-10 MED ORDER — IPRATROPIUM-ALBUTEROL 0.5-2.5 (3) MG/3ML IN SOLN
3.0000 mL | Freq: Two times a day (BID) | RESPIRATORY_TRACT | Status: DC
  Administered 2019-05-10 – 2019-05-15 (×9): 3 mL via RESPIRATORY_TRACT
  Filled 2019-05-10 (×10): qty 3

## 2019-05-10 MED ORDER — METFORMIN HCL 500 MG PO TABS
500.0000 mg | ORAL_TABLET | Freq: Every day | ORAL | Status: DC
  Administered 2019-05-11 – 2019-05-15 (×5): 500 mg via ORAL
  Filled 2019-05-10 (×5): qty 1

## 2019-05-10 MED ORDER — INSULIN ASPART 100 UNIT/ML ~~LOC~~ SOLN
0.0000 [IU] | Freq: Three times a day (TID) | SUBCUTANEOUS | Status: DC
  Administered 2019-05-10: 8 [IU] via SUBCUTANEOUS
  Administered 2019-05-10: 2 [IU] via SUBCUTANEOUS
  Administered 2019-05-10: 4 [IU] via SUBCUTANEOUS
  Administered 2019-05-10: 2 [IU] via SUBCUTANEOUS
  Administered 2019-05-11: 6 [IU] via SUBCUTANEOUS
  Administered 2019-05-11 – 2019-05-12 (×3): 4 [IU] via SUBCUTANEOUS
  Administered 2019-05-12 – 2019-05-13 (×5): 2 [IU] via SUBCUTANEOUS
  Administered 2019-05-14: 4 [IU] via SUBCUTANEOUS
  Administered 2019-05-14: 2 [IU] via SUBCUTANEOUS

## 2019-05-10 MED ORDER — LINAGLIPTIN 5 MG PO TABS
5.0000 mg | ORAL_TABLET | Freq: Every day | ORAL | Status: DC
  Administered 2019-05-10 – 2019-05-15 (×6): 5 mg via ORAL
  Filled 2019-05-10 (×6): qty 1

## 2019-05-10 MED ORDER — GLIPIZIDE ER 5 MG PO TB24
5.0000 mg | ORAL_TABLET | Freq: Every day | ORAL | Status: DC
  Administered 2019-05-11 – 2019-05-15 (×5): 5 mg via ORAL
  Filled 2019-05-10 (×5): qty 1

## 2019-05-10 MED ORDER — METFORMIN HCL 500 MG PO TABS: 500.0000 mg | ORAL_TABLET | ORAL | Status: DC

## 2019-05-10 MED ORDER — CHLORHEXIDINE GLUCONATE CLOTH 2 % EX PADS
6.0000 | MEDICATED_PAD | Freq: Every day | CUTANEOUS | Status: DC
  Administered 2019-05-10 – 2019-05-12 (×3): 6 via TOPICAL

## 2019-05-10 MED ORDER — HYDROCHLOROTHIAZIDE 12.5 MG PO CAPS
12.5000 mg | ORAL_CAPSULE | Freq: Every day | ORAL | Status: DC
  Filled 2019-05-10: qty 1

## 2019-05-10 MED ORDER — IPRATROPIUM-ALBUTEROL 0.5-2.5 (3) MG/3ML IN SOLN
3.0000 mL | Freq: Three times a day (TID) | RESPIRATORY_TRACT | Status: DC
  Administered 2019-05-10: 3 mL via RESPIRATORY_TRACT
  Filled 2019-05-10: qty 3

## 2019-05-10 MED ORDER — METFORMIN HCL 500 MG PO TABS
1000.0000 mg | ORAL_TABLET | Freq: Every day | ORAL | Status: DC
  Administered 2019-05-10 – 2019-05-14 (×4): 1000 mg via ORAL
  Filled 2019-05-10 (×5): qty 2

## 2019-05-10 MED ORDER — IRBESARTAN 150 MG PO TABS
150.0000 mg | ORAL_TABLET | Freq: Every day | ORAL | Status: DC
  Filled 2019-05-10: qty 1

## 2019-05-10 MED ORDER — ALBUMIN HUMAN 25 % IV SOLN
50.0000 g | Freq: Once | INTRAVENOUS | Status: AC
  Administered 2019-05-10: 50 g via INTRAVENOUS
  Filled 2019-05-10: qty 200

## 2019-05-10 MED ORDER — ENOXAPARIN SODIUM 40 MG/0.4ML ~~LOC~~ SOLN
40.0000 mg | SUBCUTANEOUS | Status: DC
  Administered 2019-05-10 – 2019-05-14 (×5): 40 mg via SUBCUTANEOUS
  Filled 2019-05-10 (×5): qty 0.4

## 2019-05-10 NOTE — Progress Notes (Signed)
Pt a/o x4, on the phone with her sister. Per order Albumin is being administered for soft BP. Current BP 101/52 (67). Oncoming Nurse notified about pt soft BP.

## 2019-05-10 NOTE — Progress Notes (Signed)
Physician notified: Lake Bells, PA At: 39  Regarding: symptomatic with BP 74/55 (63), light headed. No signs of active bleed. CT output 129ml total for 12 hour shift.  Awaiting return response.   Returned Response at: 1850  Order(s): Give 50g of 25% albumin. If BP continues to be soft, page Gerhardt. Nurses will continue to monitor.

## 2019-05-10 NOTE — Plan of Care (Signed)

## 2019-05-10 NOTE — Progress Notes (Signed)
      [address]       [place] [address]             [phone]      1 Day Post-Op Procedure(s) (LRB): VIDEO ASSISTED THORACOSCOPY (VATS)/WEDGE RESECTION FOR FROZEN SECTIONS  (Right) LOBECTOMY (Right) Intercostal Nerve Block thoracic (Right) Subjective: Feels good this morning and has no pain.   Objective: Vital signs in last 24 hours: Temp:  [97 F (36.1 C)-98.5 F (36.9 C)] 98.5 F (36.9 C) (11/20 0721) Pulse Rate:  [56-97] 89 (11/20 0721) Cardiac Rhythm: Normal sinus rhythm (11/20 0400) Resp:  [12-24] 13 (11/20 0721) BP: (93-166)/(41-81) 95/53 (11/20 0721) SpO2:  [96 %-100 %] 100 % (11/20 0721) Arterial Line BP: (126-145)/(54-61) 130/57 (11/19 1831) Weight:  [53.5 kg] 53.5 kg (11/19 0928)     Intake/Output from previous day: 11/19 0701 - 11/20 0700 In: 2647.3 [I.V.:2397.3; IV Piggyback:250] Out: 4580 [Urine:825; Blood:250; Chest Tube:430] Intake/Output this shift: No intake/output data recorded.  General appearance: alert, cooperative and no distress Heart: regular rate and rhythm, S1, S2 normal, no murmur, click, rub or gallop Lungs: clear to auscultation bilaterally Abdomen: soft, non-tender; bowel sounds normal; no masses,  no organomegaly Extremities: extremities normal, atraumatic, no cyanosis or edema Wound: clean and dry with some mild ecchymosis  Lab Results: Recent Labs    05/07/19 1145 05/10/19 0543  WBC 7.4 10.1  HGB 13.0 9.3*  HCT 39.1 27.5*  PLT 200 175   BMET:  Recent Labs    05/07/19 1145 05/10/19 0543  NA 138 134*  K 4.0 4.5  CL 106 103  CO2 20* 21*  GLUCOSE 67* 161*  BUN 12 13  CREATININE 0.95 0.74  CALCIUM 9.2 8.5*    PT/INR:  Recent Labs    05/07/19 1145  LABPROT 12.3  INR 0.9   ABG    Component Value Date/Time   PHART 7.402 05/10/2019 0543   HCO3 23.6 05/10/2019 0543   ACIDBASEDEF 0.6 05/10/2019 0543   O2SAT 98.4 05/10/2019 0543   CBG (last 3)  Recent Labs    05/09/19 2339 05/10/19 0417  05/10/19 0657  GLUCAP 219* 146* 140*    Assessment/Plan: S/P Procedure(s) (LRB): VIDEO ASSISTED THORACOSCOPY (VATS)/WEDGE RESECTION FOR FROZEN SECTIONS  (Right) LOBECTOMY (Right) Intercostal Nerve Block thoracic (Right)  1. CV- ST in the 100s, BP low-normal. Continue asa, statin. Would hold off on starting Avapro this morning if BP remains low. 2. Pulm-tolerating room air with good oxygen saturation. CXR stable, no large pneumothorax or effusion.  3. Creatinine 0.74, electrolytes okay 4. Blood glucose well controlled  5. No lovenox ordered. Will add for tonight.   Plan: Discontinue IV fluids and arterial line. Should be fine to give atenolol but hold Avapro and HCTZ for now. Keep chest tube for now. Intermittent air leak and still draining fluid.    LOS: 1 day    Elgie Collard 05/10/2019

## 2019-05-11 ENCOUNTER — Inpatient Hospital Stay (HOSPITAL_COMMUNITY): Payer: Medicare Other

## 2019-05-11 LAB — CBC
HCT: 25.7 % — ABNORMAL LOW (ref 36.0–46.0)
Hemoglobin: 8.5 g/dL — ABNORMAL LOW (ref 12.0–15.0)
MCH: 33.3 pg (ref 26.0–34.0)
MCHC: 33.1 g/dL (ref 30.0–36.0)
MCV: 100.8 fL — ABNORMAL HIGH (ref 80.0–100.0)
Platelets: 150 10*3/uL (ref 150–400)
RBC: 2.55 MIL/uL — ABNORMAL LOW (ref 3.87–5.11)
RDW: 11.9 % (ref 11.5–15.5)
WBC: 10.2 10*3/uL (ref 4.0–10.5)
nRBC: 0 % (ref 0.0–0.2)

## 2019-05-11 LAB — BASIC METABOLIC PANEL
Anion gap: 10 (ref 5–15)
BUN: 14 mg/dL (ref 8–23)
CO2: 23 mmol/L (ref 22–32)
Calcium: 8.8 mg/dL — ABNORMAL LOW (ref 8.9–10.3)
Chloride: 102 mmol/L (ref 98–111)
Creatinine, Ser: 0.91 mg/dL (ref 0.44–1.00)
GFR calc Af Amer: >60 mL/min (ref >60)
GFR calc non Af Amer: >60 mL/min (ref >60)
Glucose, Bld: 207 mg/dL — ABNORMAL HIGH (ref 70–99)
Potassium: 4.2 mmol/L (ref 3.5–5.1)
Sodium: 135 mmol/L (ref 135–145)

## 2019-05-11 LAB — GLUCOSE, CAPILLARY
Glucose-Capillary: 200 mg/dL — ABNORMAL HIGH (ref 70–99)
Glucose-Capillary: 201 mg/dL — ABNORMAL HIGH (ref 70–99)
Glucose-Capillary: 164 mg/dL — ABNORMAL HIGH (ref 70–99)
Glucose-Capillary: 172 mg/dL — ABNORMAL HIGH (ref 70–99)
Glucose-Capillary: 233 mg/dL — ABNORMAL HIGH (ref 70–99)

## 2019-05-11 MED ORDER — POLYETHYLENE GLYCOL 3350 17 G PO PACK
17.0000 g | PACK | Freq: Every day | ORAL | Status: DC
  Administered 2019-05-11 – 2019-05-12 (×2): 17 g via ORAL
  Filled 2019-05-11 (×2): qty 1

## 2019-05-11 MED ORDER — AMIODARONE HCL IN DEXTROSE 360-4.14 MG/200ML-% IV SOLN
60.0000 mg/h | INTRAVENOUS | Status: DC
  Administered 2019-05-11 (×2): 60 mg/h via INTRAVENOUS
  Filled 2019-05-11: qty 200

## 2019-05-11 MED ORDER — AMIODARONE HCL IN DEXTROSE 360-4.14 MG/200ML-% IV SOLN
30.0000 mg/h | INTRAVENOUS | Status: DC
  Administered 2019-05-12: 30 mg/h via INTRAVENOUS
  Filled 2019-05-11 (×5): qty 200

## 2019-05-11 MED ORDER — KETOROLAC TROMETHAMINE 15 MG/ML IJ SOLN
15.0000 mg | Freq: Four times a day (QID) | INTRAMUSCULAR | Status: AC
  Administered 2019-05-11 (×2): 15 mg via INTRAVENOUS
  Filled 2019-05-11 (×2): qty 1

## 2019-05-11 MED ORDER — AMIODARONE LOAD VIA INFUSION
150.0000 mg | Freq: Once | INTRAVENOUS | Status: AC
  Administered 2019-05-11: 150 mg via INTRAVENOUS
  Filled 2019-05-11: qty 83.34

## 2019-05-11 NOTE — Progress Notes (Addendum)
      [address]       [place] [address]             [phone]       2 Days Post-Op Procedure(s) (LRB): VIDEO ASSISTED THORACOSCOPY (VATS)/WEDGE RESECTION FOR FROZEN SECTIONS  (Right) LOBECTOMY (Right) Intercostal Nerve Block thoracic (Right)  Subjective: Patient requesting Miralax  Objective: Vital signs in last 24 hours: Temp:  [97.6 F (36.4 C)-98.8 F (37.1 C)] 98.3 F (36.8 C) (11/21 0429) Pulse Rate:  [75-91] 91 (11/21 0735) Cardiac Rhythm: Normal sinus rhythm (11/21 0314) Resp:  [9-28] 19 (11/21 0912) BP: (74-117)/(47-64) 97/54 (11/21 0912) SpO2:  [86 %-100 %] 100 % (11/21 0736)     Intake/Output from previous day: 11/20 0701 - 11/21 0700 In: 240 [P.O.:240] Out: 1275 [Urine:975; Chest Tube:300]   Physical Exam:  Cardiovascular: RRR Pulmonary: Clear to auscultation on left and slightly diminished right base Abdomen: Soft, non tender, bowel sounds present. Extremities: Trace bilateral lower extremity edema. Wounds: Clean and dry.  No erythema or signs of infection. Ecchymosis right wound Chest Tube:To water seal, no air leak  Lab Results: CBC: Recent Labs    05/10/19 0543 05/11/19 0325  WBC 10.1 10.2  HGB 9.3* 8.5*  HCT 27.5* 25.7*  PLT 175 150   BMET:  Recent Labs    05/10/19 0543 05/11/19 0325  NA 134* 135  K 4.5 4.2  CL 103 102  CO2 21* 23  GLUCOSE 161* 207*  BUN 13 14  CREATININE 0.74 0.91  CALCIUM 8.5* 8.8*    PT/INR: No results for input(s): LABPROT, INR in the last 72 hours. ABG:  INR: Will add last result for INR, ABG once components are confirmed Will add last 4 CBG results once components are confirmed  Assessment/Plan:  1. CV - Had hypotension last evening so was given Albumin as she was symptomatic. It appears her normal SBP is in the 120's. She is on Atenolol and Benicar HCT. I asked nurse last evening to stop all BP medications as she had dizzines;not done so will STOP for now. 2.  Pulmonary - On  room air. Chest tube with 300 cc last 24 hours. Chest tube is to water seal and there is no air leak. CXR this am shows low lung volumes, elevation right hemi daphragm, trace right apical pneumothorax. Hope to remove chest tube in am if output decreased. Await final pathology 3. DM-CBGs 226/200/201. On Metformin 500 mg after breakfast and 1000 mg after supper, Glipizide XL 5 mg daily, and Januvia 100 mg after breakfast. 4.  ABL anemia-H and H 8.5 and 25.7. Start oral ferrous 5. Miralax constipation  Donielle M ZimmermanPA-C 05/11/2019,9:36 AM [phone] Small air leak noted yesterday, none today If xray stable d/c chest tube tomorrow Patient asking about path- final path not complete yet I have seen and examined Holly Harper and agree with the above assessment  and plan.  Grace Isaac MD Beeper [phone] Office [phone] 05/11/2019 11:52 AM

## 2019-05-11 NOTE — Progress Notes (Signed)
Pt stated that she was dizzy in the morning. BP was 97/54, MAP 64. Hold BP medications.  PA Donielle notified this matter. D/C BP medications. BP was better now 104/69 at 1100.  Removed Rt. IJ and patient stayed on the bed for 30 min. Patient c/o itching sensation due to dilaudid PCA. If oxycodone is working well for pain control, will d/c dilaudid PCA pump. Administered oxycodone 5 mg for pain. Patient c/o Rt. Chest pain & neck pain while ambulating. Will give oxycodone 10 mg po at 4 pm and change neck dressing. HS [employer]

## 2019-05-11 NOTE — Progress Notes (Signed)
Pt asked RT for help with IS. Pt states she does not feel like she is doing it correctly. RT instructed pt on how to do IS, pt has trouble remembering to empty lungs prior to inhalation for IS. She does well managing to obtain 750 to 1000 mls when she does correctly. RT will continue to monitor and work with pt.

## 2019-05-11 NOTE — Progress Notes (Signed)
Cardiac rhythm changed around 1550, HR went up to 130's. HR stayed 110's -120's. At times went up to 130's. PA Donielle notified this matter. She ordered amiodarone drip. BP was stable: 125/70 at 1718. Pt c/o pain on Rt. Chest after administration of oxycodone 10 mg. There was not any other break through pain. Notified PA Donielle regarding this matter and verbal ordered Toradol 15 mg IVP Q 6 hrs x 2 dose. HR is staying 90's -100's now. HS [employer]

## 2019-05-11 NOTE — Progress Notes (Signed)
Amiodarone Drug - Drug Interaction Consult Note  Recommendations:  N/A Amiodarone is metabolized by the cytochrome P450 system and therefore has the potential to cause many drug interactions. Amiodarone has an average plasma half-life of 50 days (range 20 to 100 days).   There is potential for drug interactions to occur several weeks or months after stopping treatment and the onset of drug interactions may be slow after initiating amiodarone.   [x]  Statins: Increased risk of myopathy. Simvastatin- restrict dose to 20mg  daily. Other statins: counsel patients to report any muscle pain or weakness immediately.  []  Anticoagulants: Amiodarone can increase anticoagulant effect. Consider warfarin dose reduction. Patients should be monitored closely and the dose of anticoagulant altered accordingly, remembering that amiodarone levels take several weeks to stabilize.  []  Antiepileptics: Amiodarone can increase plasma concentration of phenytoin, the dose should be reduced. Note that small changes in phenytoin dose can result in large changes in levels. Monitor patient and counsel on signs of toxicity.  []  Beta blockers: increased risk of bradycardia, AV block and myocardial depression. Sotalol - avoid concomitant use.  []   Calcium channel blockers (diltiazem and verapamil): increased risk of bradycardia, AV block and myocardial depression.  []   Cyclosporine: Amiodarone increases levels of cyclosporine. Reduced dose of cyclosporine is recommended.  []  Digoxin dose should be halved when amiodarone is started.  []  Diuretics: increased risk of cardiotoxicity if hypokalemia occurs.  [x]  Oral hypoglycemic agents (glyburide, glipizide, glimepiride): increased risk of hypoglycemia. Patient's glucose levels should be monitored closely when initiating amiodarone therapy.   []  Drugs that prolong the QT interval:  Torsades de pointes risk may be increased with concurrent use - avoid if possible.  Monitor QTc,  also keep magnesium/potassium WNL if concurrent therapy can't be avoided. Marland Kitchen Antibiotics: e.g. fluoroquinolones, erythromycin. . Antiarrhythmics: e.g. quinidine, procainamide, disopyramide, sotalol. . Antipsychotics: e.g. phenothiazines, haloperidol.  . Lithium, tricyclic antidepressants, and methadone. Thank You,  Remigio Eisenmenger D. Mina Marble, PharmD, BCPS, [name] 05/11/2019, 5:20 PM

## 2019-05-12 ENCOUNTER — Inpatient Hospital Stay (HOSPITAL_COMMUNITY): Payer: Medicare Other

## 2019-05-12 LAB — GLUCOSE, CAPILLARY
Glucose-Capillary: 143 mg/dL — ABNORMAL HIGH (ref 70–99)
Glucose-Capillary: 177 mg/dL — ABNORMAL HIGH (ref 70–99)
Glucose-Capillary: 147 mg/dL — ABNORMAL HIGH (ref 70–99)
Glucose-Capillary: 125 mg/dL — ABNORMAL HIGH (ref 70–99)

## 2019-05-12 MED ORDER — AMIODARONE HCL 200 MG PO TABS
400.0000 mg | ORAL_TABLET | Freq: Two times a day (BID) | ORAL | Status: DC
  Administered 2019-05-12 – 2019-05-15 (×7): 400 mg via ORAL
  Filled 2019-05-12 (×7): qty 2

## 2019-05-12 MED ORDER — ATENOLOL 50 MG PO TABS
25.0000 mg | ORAL_TABLET | Freq: Every day | ORAL | Status: DC
  Administered 2019-05-12 – 2019-05-15 (×4): 25 mg via ORAL
  Filled 2019-05-12 (×4): qty 1

## 2019-05-12 NOTE — Progress Notes (Addendum)
      [address]       [place] [address]             [phone]       3 Days Post-Op Procedure(s) (LRB): VIDEO ASSISTED THORACOSCOPY (VATS)/WEDGE RESECTION FOR FROZEN SECTIONS  (Right) LOBECTOMY (Right) Intercostal Nerve Block thoracic (Right)  Subjective: Patient on the potty for bowel movement. She states she thinks she may have had brief a fib in the past. She sess Dr. Bettina Gavia so we will have her follow up with him after discharge. Patient had incisional pain yesterday so given a few doses of Toradol, which helped.   Objective: Vital signs in last 24 hours: Temp:  [97.9 F (36.6 C)-98.4 F (36.9 C)] 98.3 F (36.8 C) (11/22 0703) Pulse Rate:  [68-103] 68 (11/21 2300) Cardiac Rhythm: Normal sinus rhythm (11/22 0745) Resp:  [13-29] 18 (11/22 0749) BP: (92-125)/(44-88) 116/61 (11/22 0703) SpO2:  [92 %-98 %] 97 % (11/22 0749) FiO2 (%):  [94 %] 94 % (11/21 1600)     Intake/Output from previous day: 11/21 0701 - 11/22 0700 In: 1208.2 [P.O.:820; I.V.:388.2] Out: 1500 [Urine:1100; Chest Tube:400]   Physical Exam:  Cardiovascular: RRR Pulmonary: Clear to auscultation on left and slightly diminished right base Abdomen: Soft, non tender, bowel sounds present. Extremities: Trace bilateral lower extremity edema. Wounds: Clean and dry.  No erythema or signs of infection. Ecchymosis right wound Chest Tube:To water seal, no air leak  Lab Results: CBC: Recent Labs    05/10/19 0543 05/11/19 0325  WBC 10.1 10.2  HGB 9.3* 8.5*  HCT 27.5* 25.7*  PLT 175 150   BMET:  Recent Labs    05/10/19 0543 05/11/19 0325  NA 134* 135  K 4.5 4.2  CL 103 102  CO2 21* 23  GLUCOSE 161* 207*  BUN 13 14  CREATININE 0.74 0.91  CALCIUM 8.5* 8.8*    PT/INR: No results for input(s): LABPROT, INR in the last 72 hours. ABG:  INR: Will add last result for INR, ABG once components are confirmed Will add last 4 CBG results once components are confirmed   Assessment/Plan:  1. CV - She went into a fib with RVR yesterday. On Amiodarone drip. She is in SR this am. Will transition to oral Amiodarone and restart low dose Atenolol (taken prior to surgery). 2.  Pulmonary - On room air. Chest tube with 400 cc last 24 hours. Chest tube is to water seal and there is no air leak. CXR this am appears stable.  Hope to remove chest tube in am if output decresed. Await final pathology 3. DM-CBGs 172/233/143. On Metformin 500 mg after breakfast and 1000 mg after supper, Glipizide XL 5 mg daily, and Januvia 100 mg after breakfast. 4.  ABL anemia-H and H 8.5 and 25.7. Continue oral ferrous   Holly M ZimmermanPA-C 05/12/2019,8:54 AM [phone]  Final path is still pending, patient did have questions about it No air leak, 400 mL of chest tube output for 24 hours noted.  We will carefully record output for the next 12 hours and possibly remove chest tube in a.m. I have seen and examined Holly Harper and agree with the above assessment  and plan.  Grace Isaac MD [facility] [phone] Office [phone] 05/12/2019 11:57 AM

## 2019-05-12 NOTE — Progress Notes (Signed)
Saw note from previous RT.  Patient did make another comment that she did not believe that she was performing the IS correctly, but she is actually doing well.  Patient made several attempt after breathing treatment and was targeted between 700 to 1000 mls.  Encouraged patient to keep trying during commercials while watching TV.  Will continue to monitor.

## 2019-05-13 ENCOUNTER — Inpatient Hospital Stay (HOSPITAL_COMMUNITY): Payer: Medicare Other

## 2019-05-13 LAB — GLUCOSE, CAPILLARY
Glucose-Capillary: 96 mg/dL (ref 70–99)
Glucose-Capillary: 150 mg/dL — ABNORMAL HIGH (ref 70–99)
Glucose-Capillary: 143 mg/dL — ABNORMAL HIGH (ref 70–99)
Glucose-Capillary: 135 mg/dL — ABNORMAL HIGH (ref 70–99)
Glucose-Capillary: 99 mg/dL (ref 70–99)

## 2019-05-13 MED ORDER — ALUM & MAG HYDROXIDE-SIMETH 200-200-20 MG/5ML PO SUSP: 15.0000 mL | Freq: Four times a day (QID) | ORAL | Status: DC | PRN

## 2019-05-13 MED ORDER — POLYETHYLENE GLYCOL 3350 17 G PO PACK
17.0000 g | PACK | Freq: Every day | ORAL | Status: DC
  Administered 2019-05-14: 17 g via ORAL
  Filled 2019-05-13: qty 1

## 2019-05-13 MED ORDER — METOCLOPRAMIDE HCL 5 MG/ML IJ SOLN
10.0000 mg | Freq: Four times a day (QID) | INTRAMUSCULAR | Status: AC
  Administered 2019-05-13 (×3): 10 mg via INTRAVENOUS
  Filled 2019-05-13 (×3): qty 2

## 2019-05-13 MED ORDER — SIMETHICONE 80 MG PO CHEW
80.0000 mg | CHEWABLE_TABLET | Freq: Four times a day (QID) | ORAL | Status: DC | PRN
  Filled 2019-05-13: qty 1

## 2019-05-13 NOTE — Anesthesia Postprocedure Evaluation (Signed)
Anesthesia Post Note  Patient: [name]  Procedure(s) Performed: VIDEO ASSISTED THORACOSCOPY (VATS)/WEDGE RESECTION FOR FROZEN SECTIONS  (Right Chest) LOBECTOMY (Right ) Intercostal Nerve Block thoracic (Right )     Patient location during evaluation: PACU Anesthesia Type: General Level of consciousness: awake and alert Pain management: pain level controlled Vital Signs Assessment: post-procedure vital signs reviewed and stable Respiratory status: spontaneous breathing, nonlabored ventilation, respiratory function stable and patient connected to nasal cannula oxygen Cardiovascular status: blood pressure returned to baseline and stable Postop Assessment: no apparent nausea or vomiting Anesthetic complications: no    Last Vitals:  Vitals:   05/13/19 0400 05/13/19 0716  BP: 120/65   Pulse:  79  Resp: 18 17  Temp: 36.6 C   SpO2: 95% 96%    Last Pain:  Vitals:   05/13/19 0446  TempSrc:   PainSc: 2                  Zahir Eisenhour,W. EDMOND

## 2019-05-13 NOTE — Progress Notes (Signed)
      [address]       [place] [address]             [phone]      4 Days Post-Op Procedure(s) (LRB): VIDEO ASSISTED THORACOSCOPY (VATS)/WEDGE RESECTION FOR FROZEN SECTIONS  (Right) LOBECTOMY (Right) Intercostal Nerve Block thoracic (Right) Subjective: No complaints this morning. Doing well after chest tube removal.   Objective: Vital signs in last 24 hours: Temp:  [97.9 F (36.6 C)-98.1 F (36.7 C)] 97.9 F (36.6 C) (11/23 0400) Pulse Rate:  [70-79] 79 (11/23 0716) Cardiac Rhythm: Normal sinus rhythm (11/23 0346) Resp:  [14-25] 17 (11/23 0716) BP: (99-161)/(59-82) 120/65 (11/23 0400) SpO2:  [92 %-99 %] 96 % (11/23 0716)     Intake/Output from previous day: 11/22 0701 - 11/23 0700 In: 324.8 [P.O.:240; I.V.:84.8] Out: 1190 [Urine:750; Chest Tube:440] Intake/Output this shift: No intake/output data recorded.  General appearance: alert, cooperative and no distress Heart: regular rate and rhythm, S1, S2 normal, no murmur, click, rub or gallop Lungs: clear to auscultation bilaterally Abdomen: soft, non-tender; bowel sounds normal; no masses,  no organomegaly Extremities: extremities normal, atraumatic, no cyanosis or edema Wound: clean and dry  Lab Results: Recent Labs    05/11/19 0325  WBC 10.2  HGB 8.5*  HCT 25.7*  PLT 150   BMET:  Recent Labs    05/11/19 0325  NA 135  K 4.2  CL 102  CO2 23  GLUCOSE 207*  BUN 14  CREATININE 0.91  CALCIUM 8.8*    PT/INR: No results for input(s): LABPROT, INR in the last 72 hours. ABG    Component Value Date/Time   PHART 7.402 05/10/2019 0543   HCO3 23.6 05/10/2019 0543   ACIDBASEDEF 0.6 05/10/2019 0543   O2SAT 98.4 05/10/2019 0543   CBG (last 3)  Recent Labs    05/12/19 1615 05/12/19 2110 05/13/19 0623  GLUCAP 147* 125* 96    Assessment/Plan: S/P Procedure(s) (LRB): VIDEO ASSISTED THORACOSCOPY (VATS)/WEDGE RESECTION FOR FROZEN SECTIONS  (Right) LOBECTOMY (Right) Intercostal  Nerve Block thoracic (Right)  1. CV - She went into a fib with RVR Saturday. On Amiodarone PO. She is in SR this am. Continue atenolol.  2.  Pulmonary - On room air. Chest tube out this morning. F/u CXR stable.  3. DM-CBGs well controlled. On Metformin 500 mg after breakfast and 1000 mg after supper, Glipizide XL 5 mg daily, and Januvia 100 mg after breakfast. 4.  ABL anemia-H and H 9.3/27.5. Continue oral ferrous  Plan: NSR this morning. Stable after chest tube removal. Ordered a CXR for the morning. Continue to ambulate and encouraged use of incentive spirometer. Home in the next 1-2 days.    LOS: 4 days    Elgie Collard 05/13/2019

## 2019-05-13 NOTE — Discharge Summary (Addendum)
[address]       [place] [address]             [phone]   Physician Discharge Summary  Patient ID: Holly Harper MRN: [id number] DOB/AGE: [birth year] 73 y.o.  Admit date: 05/09/2019 Discharge date: 05/15/2019  Admission Diagnoses:  Patient Active Problem List   Diagnosis Date Noted  . Solitary pulmonary nodule on lung CT 04/15/2019    Discharge Diagnoses: Adenocarcinoma right upper lobe- T1N0, stage IA Active Problems:   S/P thoracotomy   Discharged Condition: good  HPI:   Holly Harper is a 73 year old woman with a past history significant for a hiatal hernia with reflux, type 2 diabetes without complication, hypertension, hyperlipidemia, asthma, and paresthesias.  She recently had been having difficulty with swallowing.  Mostly solids.  She also complained of chronic cough and chronic throat clearing.  She saw Dr. Neldon Mc.  As part of the work-up he did a chest x-ray which showed a lung nodule.  A CT was done on 03/06/2019 which showed a 2.4 x 1.7 x 1.8 cm spiculated mass in the right apex.  On PET CT the nodule was hypermetabolic with an SUV of 4.5.  She did not have any evidence of regional or distant metastases.  There was some activity in the rectum as well.  She was referred to Dr. Melvyn Novas.  He felt that this was appropriate for excisional biopsy.  She does have cough.  She has not been having any significant shortness of breath.  She denies any chest pain.  She did see Dr. Bettina Gavia from cardiology and he did not feel that any further work-up was necessary.  Her appetite is good.  She has not had any significant weight loss.  No unusual headaches or visual changes.    Hospital Course:   On 05/09/2019 Holly Harper underwent a right video-assisted thoracoscopic surgery, wedge resection of a right upper lobe nodule, right upper lobectomy, lymph node dissection, and intercostal nerve block with Dr. Roxan Hockey.  She tolerated the procedure well and  was transferred to the stepdown unit for continued care.  Postop day 1 she remained in sinus tachycardia in the 100s.  We initiated her statin and aspirin.  We held her BP meds for now since her blood pressure remained low.  She is tolerating room air with excellent oxygenation.  Her renal function remained stable.  We discontinued her IV fluids and arterial line.  We restarted her atenolol for history of atrial fibrillation.  We continued her chest tubes.  She did have an intermittent air leak and continues to drain fluid.  Postop day 2 she remained stable.  She had some hypotension overnight and was given albumin.  Her blood pressure returned to normal.  She did have some dizziness therefore her blood pressures were discontinued.  There was a small air leak in her chest tube but the air leak seemed to have resolved.  Chest x-ray was stable.  Her blood sugars remain elevated therefore we initiated her metformin, glipizide XL, and Januvia.  We started MiraLAX for mild constipation.  She went into atrial fibrillation with RVR on 05/11/2019.  She was started on amiodarone drip.  She converted to normal sinus rhythm the following morning and we restarted her atenolol.  We transitioned her IV amiodarone to oral.  The chest tube remained in place due to output.  Her blood sugars were much better controlled after initiating her home regimen.  On 05/13/2019 she was  in normal sinus rhythm and on p.o. amiodarone and atenolol.  She is on room air.  Her chest tube drainage decreased therefore we removed her chest tube.  Her follow-up chest x-ray was stable.  We continue oral iron for her acute blood loss anemia.  Today, tolerating room air, her incisions are healing well, and she is ready for discharge home.   Consults: None    Significant Diagnostic Studies:    CLINICAL DATA:  Chest tube removed.  EXAM: CHEST - 1 VIEW SAME DAY  COMPARISON:  Chest radiograph 05/13/2019  FINDINGS: Interval removal of the  right-sided chest tube.  No pneumothorax.  Redemonstrated postsurgical changes to the right mid lung with associated atelectasis and/or infiltrate. Persistent minimal left basilar atelectasis also unchanged. Cardiomediastinal silhouette within normal limits. No acute bony abnormality  IMPRESSION: Interval removal of a right chest tube.  No definite pneumothorax.  Redemonstrated postsurgical changes to the right midlung with associated atelectasis and/or infiltrate.  Minimal left basilar atelectasis also unchanged.   Electronically Signed   By: Kellie Simmering DO   On: 05/13/2019 10:19   Treatments:  NAME: Holly Harper MEDICAL RECORD [id number] ACCOUNT [id number] DATE OF BIRTH:1945-12-02 FACILITY: [facility] LOCATION: [facility] PHYSICIAN:Holly Reaves Chaya Jan, MD  OPERATIVE REPORT  DATE OF PROCEDURE:  05/09/2019  PREOPERATIVE DIAGNOSIS:  Right upper lobe lung nodule, suspected T1, N0, stage IA nonsmall-cell carcinoma.  POSTOPERATIVE DIAGNOSIS:  Nonsmall cell carcinoma, right upper lobe, clinical stage IA (T1, N0).  PROCEDURES:   1.  Right video-assisted thoracoscopy. 2.  Wedge resection, right upper lobe nodule. 3.  Right upper lobectomy. 4.  Lymph node dissection. 5.  Intercostal nerve blocks at levels 3 through 10.  SURGEON:  Modesto Charon, MD  ASSISTANT:  Enid Cutter  ANESTHESIA:  General.  FINDINGS:  A nodule clearly palpable in posterior aspect of right upper lobe.  Frozen section revealed nonsmall cell carcinoma.  Bronchial margin negative for tumor.  Multiple benign-appearing lymph nodes.   Discharge Exam: Blood pressure (!) 127/54, pulse 72, temperature 97.6 F (36.4 C), temperature source Oral, resp. rate 18, height 5\' 3"  (1.6 m), weight 53.5 kg, SpO2 100 %.   General appearance: alert, cooperative and no distress Heart: regular rate and rhythm, S1, S2 normal, no murmur, click, rub or gallop Lungs: clear to auscultation  bilaterally Abdomen: soft, non-tender; bowel sounds normal; no masses,  no organomegaly Extremities: extremities normal, atraumatic, no cyanosis or edema Wound: clean and dry  Disposition: Discharge disposition: 01-Home or Self Care        Allergies as of 05/15/2019      Reactions   Amoxicillin Hives   Did it involve swelling of the face/tongue/throat, SOB, or low BP? No Did it involve sudden or severe rash/hives, skin peeling, or any reaction on the inside of your mouth or nose? Yes Did you need to seek medical attention at a hospital or doctor's office? Yes When did it last happen? ~2015 If all above answers are "NO", may proceed with cephalosporin use.   Cinobac [cinoxacin] Other (See Comments)   Unknown    Macrodantin [nitrofurantoin Macrocrystal] Hives      Medication List    STOP taking these medications   olmesartan-hydrochlorothiazide 20-12.5 MG tablet Commonly known as: BENICAR HCT     TAKE these medications   acetaminophen 325 MG tablet Commonly known as: TYLENOL Take 2 tablets (650 mg total) by mouth every 6 (six) hours as needed for mild pain.   albuterol 108 (90 Base) MCG/ACT inhaler Commonly known  as: VENTOLIN HFA USE 2 INHALATIONS ORALLY   EVERY 4 TO 6 HOURS AS      NEEDED FOR COUGH OR WHEEZE What changed: See the new instructions.   amiodarone 200 MG tablet Commonly known as: PACERONE Take 2 tabs ( 400mg ) twice a day for 3 days then, take 1 tab (200mg ) twice a day until we see you in follow-up.   aspirin EC 81 MG tablet Take 1 tablet (81 mg total) by mouth daily. What changed: when to take this   atenolol 25 MG tablet Commonly known as: TENORMIN Take 1 tablet (25 mg total) by mouth daily. What changed:   medication strength  how much to take   atorvastatin 10 MG tablet Commonly known as: LIPITOR Take 10 mg by mouth at bedtime.   azelastine 0.1 % nasal spray Commonly known as: ASTELIN Can use two sprays in each nostril twice daily if  needed. What changed:   how much to take  how to take this  when to take this  reasons to take this  additional instructions   famotidine 40 MG tablet Commonly known as: Pepcid Take 1 tablet (40 mg total) by mouth at bedtime.   fluticasone 50 MCG/ACT nasal spray Commonly known as: FLONASE Can use one spray in each nostril one to two times daily if needed. What changed:   how much to take  how to take this  when to take this  reasons to take this   glipiZIDE 5 MG 24 hr tablet Commonly known as: GLUCOTROL XL Take 5 mg by mouth daily with breakfast.   Januvia 100 MG tablet Generic drug: sitaGLIPtin Take 100 mg by mouth daily after breakfast.   Melatonin 10 MG Tabs Take 10 mg by mouth at bedtime.   metFORMIN 500 MG tablet Commonly known as: GLUCOPHAGE Take 500-1,000 mg by mouth See admin instructions. Take 1 tablet (500 mg) by mouth after breakfast & take 2 tablets (1000 mg) by mouth after supper.   montelukast 10 MG tablet Commonly known as: SINGULAIR Take 1 tablet (10 mg total) by mouth daily. What changed: when to take this   omeprazole 40 MG capsule Commonly known as: PRILOSEC TAKE 1 CAPSULE BY MOUTH EVERY DAY What changed:   how much to take  how to take this  when to take this   ONE TOUCH ULTRA TEST test strip Generic drug: glucose blood USE AS DIRECTED TEST DAILY   traMADol 50 MG tablet Commonly known as: Ultram Take 1 tablet (50 mg total) by mouth every 12 (twelve) hours as needed.      Follow-up Information    Richardo Priest, MD. Call.   Specialty: Cardiology Why: for appointment regarding post op atrial fibrillation Contact information: [place] [address] [phone]        Melrose Nakayama, MD Follow up.   Specialty: Cardiothoracic Surgery Why: Your routine follow-up appointment is on  12/8 @12 :45. Please bring your hosiptal paperwork Contact information: [address] [place]  [address] [phone]        Sarajane Jews, [place]. Call in 1 day(s).   Specialty: Family Medicine Contact information: [address] [place] [address] [phone]           Signed: Elgie Collard 05/15/2019, 7:53 AM

## 2019-05-13 NOTE — Progress Notes (Signed)
Chest tube removed without difficulty. Patient tolerated well.

## 2019-05-13 NOTE — Progress Notes (Signed)
Dilaudid PCA discontinued, patient taking PO pain medications

## 2019-05-13 NOTE — Discharge Instructions (Signed)

## 2019-05-13 NOTE — Plan of Care (Signed)

## 2019-05-14 ENCOUNTER — Inpatient Hospital Stay (HOSPITAL_COMMUNITY): Payer: Medicare Other

## 2019-05-14 LAB — GLUCOSE, CAPILLARY
Glucose-Capillary: 127 mg/dL — ABNORMAL HIGH (ref 70–99)
Glucose-Capillary: 198 mg/dL — ABNORMAL HIGH (ref 70–99)
Glucose-Capillary: 120 mg/dL — ABNORMAL HIGH (ref 70–99)
Glucose-Capillary: 97 mg/dL (ref 70–99)

## 2019-05-14 LAB — SURGICAL PATHOLOGY

## 2019-05-14 MED ORDER — LACTULOSE 10 GM/15ML PO SOLN
20.0000 g | Freq: Once | ORAL | Status: AC
  Administered 2019-05-14: 20 g via ORAL
  Filled 2019-05-14: qty 30

## 2019-05-14 MED ORDER — BISACODYL 10 MG RE SUPP
10.0000 mg | Freq: Every day | RECTAL | Status: DC | PRN
  Administered 2019-05-14: 10 mg via RECTAL
  Filled 2019-05-14: qty 1

## 2019-05-14 NOTE — Progress Notes (Addendum)
      [address]       [place] [address]             [phone]      5 Days Post-Op Procedure(s) (LRB): VIDEO ASSISTED THORACOSCOPY (VATS)/WEDGE RESECTION FOR FROZEN SECTIONS  (Right) LOBECTOMY (Right) Intercostal Nerve Block thoracic (Right) Subjective: Feels good this morning other than some constipation.   Objective: Vital signs in last 24 hours: Temp:  [97.4 F (36.3 C)-98.1 F (36.7 C)] 97.7 F (36.5 C) (11/24 0203) Pulse Rate:  [60-74] 73 (11/24 0726) Cardiac Rhythm: Normal sinus rhythm (11/24 0203) Resp:  [12-25] 25 (11/24 0726) BP: (116-137)/(57-68) 136/63 (11/24 0203) SpO2:  [94 %-100 %] 98 % (11/24 0726)    Intake/Output from previous day: 11/23 0701 - 11/24 0700 In: 240 [P.O.:240] Out: 500 [Urine:500] Intake/Output this shift: No intake/output data recorded.  General appearance: alert, cooperative and no distress Heart: regular rate and rhythm, S1, S2 normal, no murmur, click, rub or gallop Lungs: clear to auscultation bilaterally Abdomen: soft, non-tender; bowel sounds normal; no masses,  no organomegaly Extremities: extremities normal, atraumatic, no cyanosis or edema Wound: clean and dry  Lab Results: No results for input(s): WBC, HGB, HCT, PLT in the last 72 hours. BMET: No results for input(s): NA, K, CL, CO2, GLUCOSE, BUN, CREATININE, CALCIUM in the last 72 hours.  PT/INR: No results for input(s): LABPROT, INR in the last 72 hours. ABG    Component Value Date/Time   PHART 7.402 05/10/2019 0543   HCO3 23.6 05/10/2019 0543   ACIDBASEDEF 0.6 05/10/2019 0543   O2SAT 98.4 05/10/2019 0543   CBG (last 3)  Recent Labs    05/13/19 1611 05/13/19 2101 05/14/19 0640  GLUCAP 135* 99 127*    Assessment/Plan: S/P Procedure(s) (LRB): VIDEO ASSISTED THORACOSCOPY (VATS)/WEDGE RESECTION FOR FROZEN SECTIONS  (Right) LOBECTOMY (Right) Intercostal Nerve Block thoracic (Right)  1. CV -She went into a fib with RVR Saturday. On  Amiodarone PO. She is in SR this am. Continue atenolol.  2. Pulmonary - On room air. Chest tube removed yesterday. F/u CXR stable.  3. DM-CBGswell controlled. On Metformin 500 mg after breakfast and 1000 mg after supper, Glipizide XL 5 mg daily, and Januvia 100 mg after breakfast.  4. ABL anemia-H and H 9.3/27.5. Continueoral ferrous 5. Constipation-ordered lactulose.   Plan: Discharge today once the patient has had a bowel movement. Plans to walk with nursing a few times today. Follow-up appointment has been made.    LOS: 5 days    Elgie Collard 05/14/2019  Patient seen and examined,agree with above Path T1cN0 stage IB  Revonda Standard. Roxan Hockey, MD [facility] [phone]

## 2019-05-15 LAB — GLUCOSE, CAPILLARY: Glucose-Capillary: 88 mg/dL (ref 70–99)

## 2019-05-15 LAB — TYPE AND SCREEN
ABO/RH(D): O POS
Antibody Screen: NEGATIVE
Unit division: 0
Unit division: 0

## 2019-05-15 LAB — BPAM RBC
Blood Product Expiration Date: 202012262359
Blood Product Expiration Date: 202012262359
ISSUE DATE / TIME: 202011191418
ISSUE DATE / TIME: 202011191418
Unit Type and Rh: 5100
Unit Type and Rh: 5100

## 2019-05-15 MED ORDER — ATENOLOL 25 MG PO TABS: 25.0000 mg | ORAL_TABLET | Freq: Every day | ORAL | 1 refills | Status: DC

## 2019-05-15 MED ORDER — TRAMADOL HCL 50 MG PO TABS: 50.0000 mg | ORAL_TABLET | Freq: Two times a day (BID) | ORAL | 0 refills | Status: DC | PRN

## 2019-05-15 MED ORDER — AMIODARONE HCL 200 MG PO TABS: ORAL_TABLET | ORAL | 1 refills | Status: DC

## 2019-05-15 MED ORDER — ACETAMINOPHEN 325 MG PO TABS
650.0000 mg | ORAL_TABLET | Freq: Four times a day (QID) | ORAL | Status: DC | PRN
  Administered 2019-05-15 (×2): 650 mg via ORAL
  Filled 2019-05-15 (×2): qty 2

## 2019-05-15 MED ORDER — ACETAMINOPHEN 325 MG PO TABS: 650.0000 mg | ORAL_TABLET | Freq: Four times a day (QID) | ORAL | Status: DC | PRN

## 2019-05-15 NOTE — Progress Notes (Addendum)
      [address]       [place] [address]             [phone]      6 Days Post-Op Procedure(s) (LRB): VIDEO ASSISTED THORACOSCOPY (VATS)/WEDGE RESECTION FOR FROZEN SECTIONS  (Right) LOBECTOMY (Right) Intercostal Nerve Block thoracic (Right) Subjective: Had some pain in her right axilla last night which was more severe. She thinks she may have slept on that side at some point during the night.   Objective: Vital signs in last 24 hours: Temp:  [97.6 F (36.4 C)-98.2 F (36.8 C)] 97.6 F (36.4 C) (11/25 0744) Pulse Rate:  [72-76] 72 (11/25 0744) Cardiac Rhythm: Normal sinus rhythm (11/24 1902) Resp:  [15-20] 18 (11/25 0744) BP: (127-158)/(49-68) 127/54 (11/25 0744) SpO2:  [95 %-100 %] 100 % (11/25 0744)     Intake/Output from previous day: 11/24 0701 - 11/25 0700 In: 440 [P.O.:440] Out: 725 [Urine:725] Intake/Output this shift: No intake/output data recorded.  General appearance: alert, cooperative and no distress Heart: regular rate and rhythm, S1, S2 normal, no murmur, click, rub or gallop Lungs: clear to auscultation bilaterally Abdomen: soft, non-tender; bowel sounds normal; no masses,  no organomegaly Extremities: extremities normal, atraumatic, no cyanosis or edema Wound: clean and dry  Lab Results: No results for input(s): WBC, HGB, HCT, PLT in the last 72 hours. BMET: No results for input(s): NA, K, CL, CO2, GLUCOSE, BUN, CREATININE, CALCIUM in the last 72 hours.  PT/INR: No results for input(s): LABPROT, INR in the last 72 hours. ABG    Component Value Date/Time   PHART 7.402 05/10/2019 0543   HCO3 23.6 05/10/2019 0543   ACIDBASEDEF 0.6 05/10/2019 0543   O2SAT 98.4 05/10/2019 0543   CBG (last 3)  Recent Labs    05/14/19 1649 05/14/19 2130 05/15/19 0603  GLUCAP 120* 97 88    Assessment/Plan: S/P Procedure(s) (LRB): VIDEO ASSISTED THORACOSCOPY (VATS)/WEDGE RESECTION FOR FROZEN SECTIONS  (Right) LOBECTOMY (Right)  Intercostal Nerve Block thoracic (Right)  1. CV -She went into a fib with RVRSaturday. On AmiodaronePO. She is in SR this am.Continue atenolol. 2. Pulmonary - On room air.Chest tube removed yesterday. F/u CXR stable. 3. DM-CBGswell controlled. On Metformin 500 mg after breakfast and 1000 mg after supper, Glipizide XL 5 mg daily, and Januvia 100 mg after breakfast.  4. ABL anemia-H and H9.3/27.5. Continueoral ferrous 5.Constipation-resolved. She has had multiple bowel movements since late yesterday into today.   Plan: Discharge today. Instructions reviewed. Questions answered. Follow-up appointment with Dr. Roxan Hockey already made.    LOS: 6 days    Elgie Collard 05/15/2019 Patient seen and examined, agree with above Home today Will arrange Onc referral at follow up  [place]. Roxan Hockey, MD [facility] [phone]

## 2019-05-21 ENCOUNTER — Encounter: Payer: Self-pay | Admitting: *Deleted

## 2019-05-21 NOTE — Progress Notes (Signed)
Holly Harper had a R VATS RULobectomy on 05/09/19 and was discharged on 05/15/19.  She has called this morning with concerns of continued swimmy headedness, particularly in the back.  This is associated with nausea.  She also is complaining  belching that occurs mostly at bedtime.  She did have what was called dizziness in the hospital.  But that was associated with a brief period of AF. I discussed these issues with Dr. Roxan Hockey.  He said these were more than likely due to the amiodarone and she can stop taking it.  She can take Gaviscon for the belching until the amiodarone gets out of her system.  I reminded her this may take a few days.  She will call if this does not help to relieve these conditions.

## 2019-05-23 ENCOUNTER — Other Ambulatory Visit: Payer: Self-pay | Admitting: *Deleted

## 2019-05-23 ENCOUNTER — Telehealth: Payer: Self-pay | Admitting: Physician Assistant

## 2019-05-23 ENCOUNTER — Telehealth: Payer: Self-pay | Admitting: *Deleted

## 2019-05-23 NOTE — Progress Notes (Signed)
The proposed treatment discussed in cancer conference 05/23/2019 is for discussion purpose only and is not a binding recommendation.  The patient was not physically examined nor present for their treatment options.  Therefore, final treatment plans cannot be decided.

## 2019-05-23 NOTE — Telephone Encounter (Signed)
Oncology Nurse Navigator Documentation  Oncology Nurse Navigator Flowsheets 05/23/2019  Navigator Location [facility]  Navigator Encounter Type Telephone  Barriers/Navigation Needs Coordination of Care;Education  Education Other/I received a message from [place] that patient was upset about her appt and cancelled her 12/7 appt.  I called patient to see what I could help with.  I listened as she explained she was unaware of appt needed.  I updated on the need to see Dr. Julien Nordmann.  She states she wants to be seen 2 weeks after she sees Dr. Roxan Hockey.  I scheduled her to be seen on 06/10/2019.  She verbalized understanding of appt time and place.   Interventions Coordination of Care;Education  Acuity Level 2-Minimal Needs (1-2 Barriers Identified)  Coordination of Care Appts  Education Method Verbal  Time Spent with Patient 15

## 2019-05-23 NOTE — Telephone Encounter (Signed)
Tried to reschedule patient to establish care with [facility] for monitoring after recent diagnosis of stage Ia NSCLC, adenocarcinoma. Patient did not want to schedule appointment at this time until she discusses with Dr. Roxan Hockey at her appointment next week on 05/28/2019. She also said she did not want to come in due to having surgery recently and being unclear as to the need of her appointment.

## 2019-05-24 ENCOUNTER — Telehealth: Payer: Self-pay | Admitting: Cardiology

## 2019-05-24 NOTE — Progress Notes (Deleted)
Cardiology Office Note:    Date:  05/24/2019   ID:  Holly Harper, DOB 1946/06/20, MRN [id number]  PCP:  Sarajane Jews, FNP  Cardiologist:  Shirlee More, MD    Referring MD: Sarajane Jews, FNP    ASSESSMENT:    No diagnosis found. PLAN:    In order of problems listed above:  1. ***   Next appointment: ***   Medication Adjustments/Labs and Tests Ordered: Current medicines are reviewed at length with the patient today.  Concerns regarding medicines are outlined above.  No orders of the defined types were placed in this encounter.  No orders of the defined types were placed in this encounter.   No chief complaint on file.   History of Present Illness:    Holly Harper is a 73 y.o. female with a hx of difficult to control hypertension last seen 04/18/2019.  Echocardiogram showed ejection fraction 60 to 65% mild concentric LVH normal biatrial size and no significant valvular abnormality.   She had recent video-assisted thoracotomy with wedge resection 05/19/2019 for adenocarcinoma of lung.She went into atrial fibrillation with RVR postoperatively on 05/11/2019. She was started on amiodarone drip. She converted to normal sinus rhythm the following morning and restarted her atenolol and transitioned her IV amiodarone to oral.  She was anemic postoperatively with a nadir hemoglobin of 8.5 and also had a small air leak postoperatively.  Prior to the visit I reviewed those hospital records she was not seen by cardiology during that admission.  Compliance with diet, lifestyle and medications: *** Past Medical History:  Diagnosis Date  . Allergic rhinitis   . Anxiety   . Asthma   . Diabetes ([place])   . Dysphagia   . Family history of adverse reaction to anesthesia    sister had PONV  . GERD (gastroesophageal reflux disease)   . Hiatal hernia   . Hypercholesterolemia   . Hypertension   . Paresthesia     Past Surgical History:  Procedure Laterality Date  . COLONOSCOPY   02/10/2016   Mild pancolonic diverticulosis. Otherwise, normal colonoscopy  . ESOPHAGOGASTRODUODENOSCOPY  08/24/2011   Prebyesophagus. Mininal hiatal hernia. Mild gastritis  . INTERCOSTAL NERVE BLOCK Right 05/09/2019   Procedure: Intercostal Nerve Block thoracic;  Surgeon: Melrose Nakayama, MD;  Location: [facility];  Service: Thoracic;  Laterality: Right;  . LASIK Bilateral   . LOBECTOMY Right 05/09/2019   Procedure: LOBECTOMY;  Surgeon: Melrose Nakayama, MD;  Location: [facility];  Service: Thoracic;  Laterality: Right;  Marland Kitchen VIDEO ASSISTED THORACOSCOPY (VATS)/WEDGE RESECTION Right 05/09/2019   Procedure: VIDEO ASSISTED THORACOSCOPY (VATS)/WEDGE RESECTION FOR FROZEN SECTIONS ;  Surgeon: Melrose Nakayama, MD;  Location: [facility];  Service: Thoracic;  Laterality: Right;    Current Medications: No outpatient medications have been marked as taking for the 05/27/19 encounter (Appointment) with Richardo Priest, MD.     Allergies:   Amoxicillin, Cinobac [cinoxacin], and Macrodantin [nitrofurantoin macrocrystal]   Social History   Socioeconomic History  . Marital status: Married    Spouse name: Not on file  . Number of children: Not on file  . Years of education: Not on file  . Highest education level: Not on file  Occupational History  . Not on file  Social Needs  . Financial resource strain: Not on file  . Food insecurity    Worry: Not on file    Inability: Not on file  . Transportation needs    Medical: Not on file    Non-medical: Not  on file  Tobacco Use  . Smoking status: Never Smoker  . Smokeless tobacco: Never Used  Substance and Sexual Activity  . Alcohol use: Not Currently    Frequency: Never    Comment: quit drinking years ago   . Drug use: Not Currently  . Sexual activity: Not on file  Lifestyle  . Physical activity    Days per week: Not on file    Minutes per session: Not on file  . Stress: Not on file  Relationships  . Social Herbalist on phone:  Not on file    Gets together: Not on file    Attends religious service: Not on file    Active member of club or organization: Not on file    Attends meetings of clubs or organizations: Not on file    Relationship status: Not on file  Other Topics Concern  . Not on file  Social History Narrative  . Not on file     Family History: The patient's ***family history includes Bipolar disorder in her sister; Lung cancer in her brother; Prostate cancer in her father. There is no history of Colon cancer or Esophageal cancer. ROS:   Please see the history of present illness.    All other systems reviewed and are negative.  EKGs/Labs/Other Studies Reviewed:    The following studies were reviewed today:  EKG:  EKG ordered today and personally reviewed.  The ekg ordered today demonstrates *** EKG 05/12/2019 showed sinus rhythm minor nonspecific ST abnormality normal QT interval unchanged 05/07/2019. Recent Labs: 02/13/2019: TSH 0.874 05/07/2019: ALT 25 05/11/2019: BUN 14; Creatinine, Ser 0.91; Hemoglobin 8.5; Platelets 150; Potassium 4.2; Sodium 135  Recent Lipid Panel No results found for: CHOL, TRIG, HDL, CHOLHDL, VLDL, LDLCALC, LDLDIRECT  Physical Exam:    VS:  There were no vitals taken for this visit.    Wt Readings from Last 3 Encounters:  05/09/19 118 lb (53.5 kg)  05/07/19 118 lb (53.5 kg)  04/23/19 120 lb (54.4 kg)     GEN: *** Well nourished, well developed in no acute distress HEENT: Normal NECK: No JVD; No carotid bruits LYMPHATICS: No lymphadenopathy CARDIAC: ***RRR, no murmurs, rubs, gallops RESPIRATORY:  Clear to auscultation without rales, wheezing or rhonchi  ABDOMEN: Soft, non-tender, non-distended MUSCULOSKELETAL:  No edema; No deformity  SKIN: Warm and dry NEUROLOGIC:  Alert and oriented x 3 PSYCHIATRIC:  Normal affect    Signed, Shirlee More, MD  05/24/2019 5:33 PM    [facility]

## 2019-05-24 NOTE — Telephone Encounter (Signed)
Please call patient regarding some med changes that occurred while in the hospital at [facility].Holly Harper

## 2019-05-24 NOTE — Telephone Encounter (Signed)
Patient called to make sure Dr. Bettina Gavia is aware she was admitted to [facility] from 05/09/2019-05/15/2019. Dr. Bettina Gavia made aware and has been forced into the schedule on Monday, 05/27/2019, at 12:40 pm in the [place] office per Dr. Bettina Gavia. Patient is agreeable and will arrive 15 minutes early for her appointment. No further questions.

## 2019-05-27 ENCOUNTER — Other Ambulatory Visit: Payer: Self-pay | Admitting: Thoracic Surgery (Cardiothoracic Vascular Surgery)

## 2019-05-27 ENCOUNTER — Ambulatory Visit: Payer: Medicare Other | Admitting: Cardiology

## 2019-05-27 DIAGNOSIS — Z9889 Other specified postprocedural states: Secondary | ICD-10-CM

## 2019-05-28 ENCOUNTER — Ambulatory Visit (INDEPENDENT_AMBULATORY_CARE_PROVIDER_SITE_OTHER): Payer: Self-pay | Admitting: Thoracic Surgery (Cardiothoracic Vascular Surgery)

## 2019-05-28 ENCOUNTER — Other Ambulatory Visit: Payer: Self-pay

## 2019-05-28 ENCOUNTER — Encounter: Payer: Self-pay | Admitting: Thoracic Surgery (Cardiothoracic Vascular Surgery)

## 2019-05-28 ENCOUNTER — Ambulatory Visit
Admission: RE | Admit: 2019-05-28 | Discharge: 2019-05-28 | Disposition: A | Discharge status: Home / Self Care | Payer: Medicare Other | Source: Ambulatory Visit | Attending: Thoracic Surgery (Cardiothoracic Vascular Surgery) | Admitting: Thoracic Surgery (Cardiothoracic Vascular Surgery)

## 2019-05-28 ENCOUNTER — Ambulatory Visit: Payer: Medicare Other | Admitting: Physician Assistant

## 2019-05-28 VITALS — BP 142/77 | HR 72 | Temp 97.7°F | Resp 16 | Ht 63.0 in | Wt 117.0 lb

## 2019-05-28 DIAGNOSIS — Z9889 Other specified postprocedural states: Secondary | ICD-10-CM

## 2019-05-28 DIAGNOSIS — C3411 Malignant neoplasm of upper lobe, right bronchus or lung: Secondary | ICD-10-CM

## 2019-05-28 DIAGNOSIS — Z902 Acquired absence of lung [part of]: Secondary | ICD-10-CM

## 2019-05-28 MED ORDER — OLMESARTAN MEDOXOMIL-HCTZ 20-12.5 MG PO TABS: 1.0000 | ORAL_TABLET | Freq: Every day | ORAL | 5 refills | Status: DC

## 2019-05-28 NOTE — Progress Notes (Signed)
[address]       [place] [address]             [phone]      HPI: Holly Harper returns for a scheduled follow-up visit  Holly Harper is a 73 year old woman who is a non-smoker.  She was being evaluated for chronic cough and was found to have a 2.4 x 1.7 x 1.8 cm spiculated mass in the right lung.  The nodule was hypermetabolic on PET/CT.  I did a thoracoscopic right upper lobectomy for a T1c, N0, stage Ia adenocarcinoma on 05/09/2019.  Postoperatively she had some transient atrial fibrillation.  She was placed on amiodarone.  She converted to sinus rhythm, but she had significant nausea.  That improved and she was sent home on day 6.  She called back a couple days later with persistent nausea and we stopped her amiodarone.  She has not had any more nausea since then but she does complain of persistent belching.  She takes an occasional Tylenol for pain but has not had to take any narcotics.  Past Medical History:  Diagnosis Date  . Allergic rhinitis   . Anxiety   . Asthma   . Diabetes ([place])   . Dysphagia   . Family history of adverse reaction to anesthesia    sister had PONV  . GERD (gastroesophageal reflux disease)   . Hiatal hernia   . Hypercholesterolemia   . Hypertension   . Paresthesia     Current Outpatient Medications  Medication Sig Dispense Refill  . acetaminophen (TYLENOL) 325 MG tablet Take 2 tablets (650 mg total) by mouth every 6 (six) hours as needed for mild pain.    Marland Kitchen albuterol (VENTOLIN HFA) 108 (90 Base) MCG/ACT inhaler USE 2 INHALATIONS ORALLY   EVERY 4 TO 6 HOURS AS      NEEDED FOR COUGH OR WHEEZE (Patient taking differently: Inhale 2 puffs into the lungs every 4 (four) hours as needed (wheezing/cough). ) 51 g 0  . aspirin EC 81 MG tablet Take 1 tablet (81 mg total) by mouth daily. (Patient taking differently: Take 81 mg by mouth every evening. ) 90 tablet 3  . atenolol (TENORMIN) 25 MG tablet Take 1 tablet (25 mg total) by mouth  daily. 30 tablet 1  . atorvastatin (LIPITOR) 10 MG tablet Take 10 mg by mouth at bedtime.     Marland Kitchen azelastine (ASTELIN) 0.1 % nasal spray Can use two sprays in each nostril twice daily if needed. (Patient taking differently: Place 1-2 sprays into both nostrils 2 (two) times daily as needed (allergies.). ) 90 mL 1  . famotidine (PEPCID) 40 MG tablet Take 1 tablet (40 mg total) by mouth at bedtime. 30 tablet 11  . fluticasone (FLONASE) 50 MCG/ACT nasal spray Can use one spray in each nostril one to two times daily if needed. (Patient taking differently: Place 1-2 sprays into both nostrils daily as needed for allergies. Can use one spray in each nostril one to two times daily if needed.) 48 g 1  . glipiZIDE (GLUCOTROL XL) 5 MG 24 hr tablet Take 5 mg by mouth daily with breakfast.    . JANUVIA 100 MG tablet Take 100 mg by mouth daily after breakfast.     . Melatonin 10 MG TABS Take 10 mg by mouth at bedtime.    . metFORMIN (GLUCOPHAGE) 500 MG tablet Take 500-1,000 mg by mouth See admin instructions. Take 1 tablet (500 mg) by mouth  after breakfast & take 2 tablets (1000 mg) by mouth after supper.    . montelukast (SINGULAIR) 10 MG tablet Take 1 tablet (10 mg total) by mouth daily. (Patient taking differently: Take 10 mg by mouth at bedtime. ) 90 tablet 1  . omeprazole (PRILOSEC) 40 MG capsule TAKE 1 CAPSULE BY MOUTH EVERY DAY (Patient taking differently: Take 40 mg by mouth daily before breakfast. TAKE 1 CAPSULE BY MOUTH EVERY DAY) 30 capsule 11  . ONE TOUCH ULTRA TEST test strip USE AS DIRECTED TEST DAILY  11  . olmesartan-hydrochlorothiazide (BENICAR HCT) 20-12.5 MG tablet Take 1 tablet by mouth daily. 30 tablet 5   No current facility-administered medications for this visit.     Physical Exam  Diagnostic Tests:   Impression: Holly Harper is a 73 year old woman with a history of type 2 diabetes, hiatal hernia with reflux, hypertension, hyperlipidemia, asthma, paresthesias, and a stage Ia  adenocarcinoma the right upper lobe.  She had a thoracoscopic right upper lobectomy about 3 weeks ago.  She is doing very well.  She has minimal discomfort.  She is not requiring narcotics.  She has not had any respiratory issues.  Her primary complaint is persistent belching which she says happens about every day will last up to 45 minutes at a time.  The only thing I can think of is that she could possibly have some vagal nerve dysfunction.  She does have a history of reflux but is back on her medications for that.  I think that should get better with time.  She may begin driving.  Appropriate precautions were discussed.  There are no restrictions on her activities but she was advised to build into her activities gradually over the next several weeks.  She is a lifelong non-smoker.  Plan: Resume Benicar 20/12.5 one daily Referral to Dr. Hosie Poisson at [facility] Return in 2 months with PA lateral chest x-ray  Holly Nakayama, MD [facility] [phone]

## 2019-05-30 ENCOUNTER — Telehealth: Payer: Self-pay | Admitting: Internal Medicine

## 2019-05-30 NOTE — Telephone Encounter (Signed)
Returned patient's phone call regarding  cancelling 12/21 appointments, patient informed me she will be getting care elsewhere.

## 2019-06-06 ENCOUNTER — Other Ambulatory Visit: Payer: Self-pay | Admitting: Physician Assistant

## 2019-06-07 ENCOUNTER — Telehealth: Payer: Self-pay

## 2019-06-07 DIAGNOSIS — C3411 Malignant neoplasm of upper lobe, right bronchus or lung: Secondary | ICD-10-CM

## 2019-06-07 NOTE — Telephone Encounter (Signed)
Pt calls office to report discoloration/bruising to chest and R arm that is extremely painful to the touch. S/p R VATS, lobectomy on 05/09/19. She states this has been present since surgery but that it seems to be getting worse. She says her oncologist looked at the areas recently and told her he had "never seen anything like it before." No sob or other complaint reported. Suggested pt come in to be evaluated by a PA. She cannot come this afternoon and schedules appt for Monday 06/10/19.

## 2019-06-07 NOTE — Telephone Encounter (Signed)
Pt calls office again to report that she scrubbed the areas of discoloration on her R arm and chest with "hotter water than usual" and the color of her skin has returned to normal. Requests that 06/10/19 appt w/ PA be cancelled.

## 2019-06-10 ENCOUNTER — Ambulatory Visit: Payer: Medicare Other | Admitting: Internal Medicine

## 2019-06-10 ENCOUNTER — Other Ambulatory Visit: Payer: Medicare Other

## 2019-06-10 ENCOUNTER — Ambulatory Visit: Payer: Self-pay

## 2019-06-21 DIAGNOSIS — C259 Malignant neoplasm of pancreas, unspecified: Secondary | ICD-10-CM | POA: Insufficient documentation

## 2019-06-21 HISTORY — DX: Malignant neoplasm of pancreas, unspecified: C25.9

## 2019-07-02 ENCOUNTER — Other Ambulatory Visit: Payer: Self-pay | Admitting: Allergy and Immunology

## 2019-07-05 ENCOUNTER — Other Ambulatory Visit: Payer: Self-pay | Admitting: Cardiology

## 2019-07-05 MED ORDER — ATENOLOL 25 MG PO TABS: 25.0000 mg | ORAL_TABLET | Freq: Every day | ORAL | 0 refills | Status: DC

## 2019-07-05 NOTE — Telephone Encounter (Signed)
Refilled Atenolol 25mg , qd per her discharge summary orders on 05/13/19 by Dr. Roxan Hockey  "atenolol 25 MG tablet Commonly known as: TENORMIN Take 1 tablet (25 mg total) by mouth daily. What changed:   medication strength how much to take"  Pt instructed to keep upcoming appointment with Dr. Bettina Gavia for additional refills.

## 2019-07-05 NOTE — Telephone Encounter (Signed)
*  STAT* If patient is at the pharmacy, call can be transferred to refill team.   1. Which medications need to be refilled? (please list name of each medication and dose if known)  atenolol (TENORMIN) 25 MG tablet  2. Which pharmacy/location (including street and city if local pharmacy) is medication to be sent to? [id number] - [place], [place] - [address]  3. Do they need a 30 day or 90 day supply? 90 day   Patient is currently out of medication.

## 2019-07-08 ENCOUNTER — Ambulatory Visit: Payer: Medicare Other | Admitting: Allergy and Immunology

## 2019-07-09 ENCOUNTER — Telehealth: Payer: Self-pay | Admitting: Thoracic Surgery (Cardiothoracic Vascular Surgery)

## 2019-07-09 MED ORDER — GABAPENTIN 300 MG PO CAPS: 300.0000 mg | ORAL_CAPSULE | Freq: Two times a day (BID) | ORAL | 2 refills | Status: DC

## 2019-07-09 NOTE — Telephone Encounter (Signed)
      [address]       [place] [address]             [phone]      [name] had called in Friday with a complaint of sensitivity in the chest around her incision.  [name] is a 74 year old woman who had a thoracoscopic right upper lobectomy for a T1 a, N0, stage Ia adenocarcinoma on 05/09/2019.  She says that she has high degree of skin sensitivity in the area of the incision in the right breast.  He is not taking any narcotics.  She had been taking 1 325 mg acetaminophen tablet a night for that pain.  She recently started taking 2 of those tablets and that has helped some but she still has sensitivity that is bothering her and wanted know if there was anything that we could prescribe to help with that.  We discussed possibility of trial of gabapentin.  She is in favor of that.  [facility] [place] [facility] gabapentin 300 mg p.o. twice daily, 60 tablets, no refills.  Revonda Standard Roxan Hockey, MD [facility] [phone]

## 2019-07-10 ENCOUNTER — Ambulatory Visit: Payer: Medicare Other | Admitting: Cardiology

## 2019-07-19 ENCOUNTER — Ambulatory Visit: Payer: Medicare Other

## 2019-07-27 ENCOUNTER — Ambulatory Visit: Payer: Medicare Other | Attending: Internal Medicine

## 2019-07-27 DIAGNOSIS — Z23 Encounter for immunization: Secondary | ICD-10-CM | POA: Insufficient documentation

## 2019-07-27 NOTE — Progress Notes (Signed)
   Covid-19 Vaccination Clinic  Name:  Milina Pagett    MRN: [id number] DOB: July 10, 1945  07/27/2019  Ms. Lyman was observed post Covid-19 immunization for 15 minutes without incidence. She was provided with Vaccine Information Sheet and instruction to access the V-Safe system.   Ms. Doorn was instructed to call 911 with any severe reactions post vaccine: Marland Kitchen Difficulty breathing  . Swelling of your face and throat  . A fast heartbeat  . A bad rash all over your body  . Dizziness and weakness    Immunizations Administered    Name Date Dose VIS Date Route   Pfizer COVID-19 Vaccine 07/27/2019  3:54 PM 0.3 mL 05/31/2019 Intramuscular   Manufacturer: [place]   Lot: [id number]   [place]: [id number]

## 2019-07-28 ENCOUNTER — Other Ambulatory Visit: Payer: Self-pay | Admitting: Allergy and Immunology

## 2019-07-30 ENCOUNTER — Ambulatory Visit: Payer: Medicare Other | Admitting: Thoracic Surgery (Cardiothoracic Vascular Surgery)

## 2019-08-05 ENCOUNTER — Ambulatory Visit: Payer: Medicare Other

## 2019-08-10 NOTE — Progress Notes (Signed)
Cardiology Office Note:    Date:  08/12/2019   ID:  [place], [place] September 29, 1945, MRN [id number]  PCP:  Garwin Brothers, MD  Cardiologist:  Shirlee More, MD    Referring MD: Sarajane Jews, FNP    ASSESSMENT:    1. Paroxysmal atrial fibrillation (HCC)   2. Essential hypertension   3. Mixed hyperlipidemia   4. Type 2 diabetes mellitus without complication, without long-term current use of insulin ([place])   5. Malignant neoplasm of upper lobe of right lung (HCC)    PLAN:    In order of problems listed above:  1. For now continue off anticoagulation off amiodarone with amiodarone increase beta-blocker start discrete erythematous marking she has recurrent atrial fibrillation he need not rest anticoagulation and antiarrhythmic drug. 2. Stable BP at target continue current treatment Home blood pressures consistently less than 120 30/80. 3. Continue statin 4. Managed by her PCP 5. Improved she had favorable results requires no additional therapy beyond surgical resection   Next appointment: 3 months   Medication Adjustments/Labs and Tests Ordered: Current medicines are reviewed at length with the patient today.  Concerns regarding medicines are outlined above.  No orders of the defined types were placed in this encounter.  No orders of the defined types were placed in this encounter.   Chief Complaint  Patient presents with  . Follow-up    History of Present Illness:    Holly Harper is a 74 y.o. female with a hx of difficult to control hypertension and lung cancer last seen 04/18/2019.  I reviewed the [facility] records from her admission to the hospital 05/09/2019 at 05/15/2019 with resection of adenocarcinoma right upper lobe T1N0 stage Ia with thoracotomy.  Her course was complicated by sinus tachycardia hypotension requiring IV fluids and a small air leak in her chest tube.  She developed atrial fibrillation and was treated with amiodarone  and converted to sinus rhythm.  I personally reviewed the EKG of 05/12/2019 prior to discharge showing sinus rhythm.  Chest x-ray 05/28/2019 independently reviewed there is atelectasis seen in the right chest on the left lungs well aerated.  Laboratory review shows her last hemoglobin of 8.5 05/11/2019 and BMP that day showed a creatinine of 0.91 GFR greater than 60 cc and potassium 4.2.  Compliance with diet, lifestyle and medications: Yes  She was unable to tolerate anticoagulation discontinued except for 1 day after resection of the lipoma she has not had rapid heart rate.  We are both concerned about recurrence she will increase her beta-blocker to the previous day and I encouraged him to purchase the iPhone adapter to monitor rhythm at home.  Recurrent atrial fibrillation she will need anticoagulation and anti-inflammatory.  She still has some incisional pain Right transfemoral is really doing a quite well no shortness of breath or cough is resolved no syncope or chest anginal pain. Past Medical History:  Diagnosis Date  . Allergic rhinitis   . Anxiety   . Asthma   . Diabetes ([place])   . Dysphagia   . Family history of adverse reaction to anesthesia    sister had PONV  . GERD (gastroesophageal reflux disease)   . Hiatal hernia   . Hypercholesterolemia   . Hypertension   . Paresthesia     Past Surgical History:  Procedure Laterality Date  . COLONOSCOPY  02/10/2016   Mild pancolonic diverticulosis. Otherwise, normal colonoscopy  . ESOPHAGOGASTRODUODENOSCOPY  08/24/2011   Prebyesophagus. Mininal hiatal hernia. Mild gastritis  .  INTERCOSTAL NERVE BLOCK Right 05/09/2019   Procedure: Intercostal Nerve Block thoracic;  Surgeon: Melrose Nakayama, MD;  Location: [facility];  Service: Thoracic;  Laterality: Right;  . LASIK Bilateral   . LOBECTOMY Right 05/09/2019   Procedure: LOBECTOMY;  Surgeon: Melrose Nakayama, MD;  Location: [facility];  Service: Thoracic;  Laterality: Right;  Marland Kitchen VIDEO  ASSISTED THORACOSCOPY (VATS)/WEDGE RESECTION Right 05/09/2019   Procedure: VIDEO ASSISTED THORACOSCOPY (VATS)/WEDGE RESECTION FOR FROZEN SECTIONS ;  Surgeon: Melrose Nakayama, MD;  Location: [facility];  Service: Thoracic;  Laterality: Right;    Current Medications: Current Meds  Medication Sig  . acetaminophen (TYLENOL) 325 MG tablet Take 2 tablets (650 mg total) by mouth every 6 (six) hours as needed for mild pain.  Marland Kitchen aspirin EC 81 MG tablet Take 1 tablet (81 mg total) by mouth daily.  Marland Kitchen atenolol (TENORMIN) 25 MG tablet Take 1 tablet (25 mg total) by mouth daily. Please keep upcoming appointment with Dr. Bettina Gavia on 2/22 for additional refills.  Marland Kitchen atorvastatin (LIPITOR) 10 MG tablet Take 10 mg by mouth at bedtime.   Marland Kitchen azelastine (ASTELIN) 0.1 % nasal spray Can use two sprays in each nostril twice daily if needed. (Patient taking differently: Place 1 spray into both nostrils 2 (two) times daily as needed. Can use two sprays in each nostril twice daily if needed.)  . gabapentin (NEURONTIN) 300 MG capsule Take 1 capsule (300 mg total) by mouth 2 (two) times daily.  Marland Kitchen glipiZIDE (GLUCOTROL XL) 5 MG 24 hr tablet Take 5 mg by mouth daily with breakfast.  . JANUVIA 100 MG tablet Take 100 mg by mouth daily after breakfast.   . Melatonin 10 MG TABS Take 10 mg by mouth at bedtime.  . metFORMIN (GLUCOPHAGE) 500 MG tablet Take 500-1,000 mg by mouth See admin instructions. Take 1 tablet (500 mg) by mouth after breakfast & take 2 tablets (1000 mg) by mouth after supper.  . montelukast (SINGULAIR) 10 MG tablet TAKE 1 TABLET BY MOUTH EVERY DAY  . olmesartan-hydrochlorothiazide (BENICAR HCT) 20-12.5 MG tablet Take 1 tablet by mouth daily.  Marland Kitchen omeprazole (PRILOSEC) 40 MG capsule TAKE 1 CAPSULE BY MOUTH EVERY DAY (Patient taking differently: Take 40 mg by mouth daily before breakfast. TAKE 1 CAPSULE BY MOUTH EVERY DAY)  . ONE TOUCH ULTRA TEST test strip USE AS DIRECTED TEST DAILY     Allergies:   Amoxicillin,  Cinobac [cinoxacin], and Macrodantin [nitrofurantoin macrocrystal]   Social History   Socioeconomic History  . Marital status: Married    Spouse name: Not on file  . Number of children: Not on file  . Years of education: Not on file  . Highest education level: Not on file  Occupational History  . Not on file  Tobacco Use  . Smoking status: Never Smoker  . Smokeless tobacco: Never Used  Substance and Sexual Activity  . Alcohol use: Not Currently    Comment: quit drinking years ago   . Drug use: Not Currently  . Sexual activity: Not on file  Other Topics Concern  . Not on file  Social History Narrative  . Not on file   Social Determinants of Health   Financial Resource Strain:   . Difficulty of Paying Living Expenses: Not on file  Food Insecurity:   . Worried About Charity fundraiser in the Last Year: Not on file  . Ran Out of Food in the Last Year: Not on file  Transportation Needs:   .  Lack of Transportation (Medical): Not on file  . Lack of Transportation (Non-Medical): Not on file  Physical Activity:   . Days of Exercise per Week: Not on file  . Minutes of Exercise per Session: Not on file  Stress:   . Feeling of Stress : Not on file  Social Connections:   . Frequency of Communication with Friends and Family: Not on file  . Frequency of Social Gatherings with Friends and Family: Not on file  . Attends Religious Services: Not on file  . Active Member of Clubs or Organizations: Not on file  . Attends Archivist Meetings: Not on file  . Marital Status: Not on file     Family History: The patient's family history includes Bipolar disorder in her sister; Lung cancer in her brother; Prostate cancer in her father. There is no history of Colon cancer or Esophageal cancer. ROS:   Please see the history of present illness.    All other systems reviewed and are negative.  EKGs/Labs/Other Studies Reviewed:    The following studies were reviewed  today:    Recent Labs: 02/13/2019: TSH 0.874 05/07/2019: ALT 25 05/11/2019: BUN 14; Creatinine, Ser 0.91; Hemoglobin 8.5; Platelets 150; Potassium 4.2; Sodium 135  Recent Lipid Panel No results found for: CHOL, TRIG, HDL, CHOLHDL, VLDL, LDLCALC, LDLDIRECT  Physical Exam:    VS:  BP 124/70   Pulse 72   Ht 5\' 3"  (1.6 m)   Wt 119 lb (54 kg)   SpO2 94%   BMI 21.08 kg/m     Wt Readings from Last 3 Encounters:  08/12/19 119 lb (54 kg)  05/28/19 117 lb (53.1 kg)  05/09/19 118 lb (53.5 kg)     GEN:  Well nourished, well developed in no acute distress HEENT: Normal NECK: No JVD; No carotid bruits LYMPHATICS: No lymphadenopathy CARDIAC: RRR, no murmurs, rubs, gallops RESPIRATORY:  Clear to auscultation without rales, wheezing or rhonchi  ABDOMEN: Soft, non-tender, non-distended MUSCULOSKELETAL:  No edema; No deformity  SKIN: Warm and dry NEUROLOGIC:  Alert and oriented x 3 PSYCHIATRIC:  Normal affect    Signed, Shirlee More, MD  08/12/2019 2:38 PM    [place]

## 2019-08-12 ENCOUNTER — Other Ambulatory Visit: Payer: Self-pay

## 2019-08-12 ENCOUNTER — Other Ambulatory Visit: Payer: Self-pay | Admitting: Thoracic Surgery (Cardiothoracic Vascular Surgery)

## 2019-08-12 ENCOUNTER — Encounter: Payer: Self-pay | Admitting: Cardiology

## 2019-08-12 ENCOUNTER — Ambulatory Visit (INDEPENDENT_AMBULATORY_CARE_PROVIDER_SITE_OTHER): Payer: Medicare Other | Admitting: Cardiology

## 2019-08-12 VITALS — BP 124/70 | HR 72 | Ht 63.0 in | Wt 119.0 lb

## 2019-08-12 DIAGNOSIS — I10 Essential (primary) hypertension: Secondary | ICD-10-CM

## 2019-08-12 DIAGNOSIS — E119 Type 2 diabetes mellitus without complications: Secondary | ICD-10-CM

## 2019-08-12 DIAGNOSIS — E782 Mixed hyperlipidemia: Secondary | ICD-10-CM

## 2019-08-12 DIAGNOSIS — Z9889 Other specified postprocedural states: Secondary | ICD-10-CM

## 2019-08-12 DIAGNOSIS — C3411 Malignant neoplasm of upper lobe, right bronchus or lung: Secondary | ICD-10-CM

## 2019-08-12 DIAGNOSIS — I480 Paroxysmal atrial fibrillation: Secondary | ICD-10-CM | POA: Diagnosis not present

## 2019-08-12 MED ORDER — ATENOLOL 50 MG PO TABS: 50.0000 mg | ORAL_TABLET | Freq: Every day | ORAL | 3 refills | Status: DC

## 2019-08-12 NOTE — Patient Instructions (Addendum)
Medication Instructions:  Your physician has recommended you make the following change in your medication:  1.  INCREASE the Atenolol to 50 mg taking 1 daily  *If you need a refill on your cardiac medications before your next appointment, please call your pharmacy*  Lab Work: None ordered  If you have labs (blood work) drawn today and your tests are completely normal, you will receive your results only by: Marland Kitchen MyChart Message (if you have MyChart) OR . A paper copy in the mail If you have any lab test that is abnormal or we need to change your treatment, we will call you to review the results.  Testing/Procedures: None ordered  Follow-Up: At [facility], you and your health needs are our priority.  As part of our continuing mission to provide you with exceptional heart care, we have created designated Provider Care Teams.  These Care Teams include your primary Cardiologist (physician) and Advanced Practice Providers (APPs -  Physician Assistants and Nurse Practitioners) who all work together to provide you with the care you need, when you need it.  Your next appointment:   3 month(s)  The format for your next appointment:   In Person  Provider:   Shirlee More, MD  Other Instructions KardiaMobile [email or web address]        FDA-cleared, clinical grade mobile EKG monitor: Jodelle Red is the most clinically-validated mobile EKG used by the world's leading cardiac care medical professionals With Basic service, know instantly if your heart rhythm is normal or if atrial fibrillation is detected, and email the last single EKG recording to yourself or your doctor Premium service, available for purchase through the Kardia app for $9.99 per month or $99 per year, includes unlimited history and storage of your EKG recordings, a monthly EKG summary report to share with your doctor, along with the ability to track your blood pressure, activity and weight Includes one  KardiaMobile phone clip FREE SHIPPING: Standard delivery 1-3 business days. Orders placed by 11:00am PST will ship that afternoon. Otherwise, will ship next business day. All orders ship via [employer] from [place], [place]

## 2019-08-12 NOTE — Addendum Note (Signed)
Addended by: Gaetano Net on: 08/12/2019 02:43 PM   Modules accepted: Orders

## 2019-08-12 NOTE — Addendum Note (Signed)
Addended by: Ronaldo Miyamoto on: 08/12/2019 03:20 PM   Modules accepted: Orders

## 2019-08-13 ENCOUNTER — Ambulatory Visit
Admission: RE | Admit: 2019-08-13 | Discharge: 2019-08-13 | Disposition: A | Discharge status: Home / Self Care | Payer: Medicare Other | Source: Ambulatory Visit | Attending: Thoracic Surgery (Cardiothoracic Vascular Surgery) | Admitting: Thoracic Surgery (Cardiothoracic Vascular Surgery)

## 2019-08-13 ENCOUNTER — Ambulatory Visit (INDEPENDENT_AMBULATORY_CARE_PROVIDER_SITE_OTHER): Payer: Medicare Other | Admitting: Thoracic Surgery (Cardiothoracic Vascular Surgery)

## 2019-08-13 VITALS — BP 129/62 | HR 68 | Temp 97.7°F | Resp 20 | Ht 63.0 in | Wt 120.0 lb

## 2019-08-13 DIAGNOSIS — M792 Neuralgia and neuritis, unspecified: Secondary | ICD-10-CM

## 2019-08-13 DIAGNOSIS — Z902 Acquired absence of lung [part of]: Secondary | ICD-10-CM | POA: Diagnosis not present

## 2019-08-13 DIAGNOSIS — C3491 Malignant neoplasm of unspecified part of right bronchus or lung: Secondary | ICD-10-CM

## 2019-08-13 DIAGNOSIS — Z9889 Other specified postprocedural states: Secondary | ICD-10-CM

## 2019-08-13 MED ORDER — GABAPENTIN 300 MG PO CAPS: 300.0000 mg | ORAL_CAPSULE | Freq: Three times a day (TID) | ORAL | 3 refills | Status: DC

## 2019-08-13 NOTE — Progress Notes (Signed)
[address]       [place], [id number]             [phone]      HPI: Mrs. Erny returns for a scheduled follow-up visit  Siedah Beecham is a 74 year old woman with a past medical history significant for asthma, diabetes, reflux, hiatal hernia, hypertension, and hyperlipidemia.  She is a lifelong non-smoker.  She presented with a cough and was found to have a 2.4 x 1.7 x 1.8 cm spiculated mass in the right lung.  She underwent a thoracoscopic right upper lobectomy on 05/09/2019.  The mass was a stage Ia adenocarcinoma.  She had some atrial fibrillation postoperatively and was started on amiodarone.  That was stopped due to nausea.  Her primary complaint has been extreme hypersensitivity of the skin over her right breast.  She also complains of pain in her right shoulder and under the right shoulder blade.  We started her on gabapentin in January.  She says that has helped significantly.  She has been on 300 mg twice daily.  Past Medical History:  Diagnosis Date  . Allergic rhinitis   . Anxiety   . Asthma   . Diabetes ([place])   . Dysphagia   . Family history of adverse reaction to anesthesia    sister had PONV  . GERD (gastroesophageal reflux disease)   . Hiatal hernia   . Hypercholesterolemia   . Hypertension   . Paresthesia     Current Outpatient Medications  Medication Sig Dispense Refill  . acetaminophen (TYLENOL) 325 MG tablet Take 2 tablets (650 mg total) by mouth every 6 (six) hours as needed for mild pain.    Marland Kitchen albuterol (VENTOLIN HFA) 108 (90 Base) MCG/ACT inhaler USE 2 INHALATIONS ORALLY   EVERY 4 TO 6 HOURS AS      NEEDED FOR COUGH OR WHEEZE 51 g 0  . aspirin EC 81 MG tablet Take 1 tablet (81 mg total) by mouth daily. 90 tablet 3  . atenolol (TENORMIN) 50 MG tablet Take 1 tablet (50 mg total) by mouth daily. 90 tablet 3  . atorvastatin (LIPITOR) 10 MG tablet Take 10 mg by mouth at bedtime.     Marland Kitchen azelastine (ASTELIN) 0.1 % nasal spray Can use  two sprays in each nostril twice daily if needed. (Patient taking differently: Place 1 spray into both nostrils 2 (two) times daily as needed. Can use two sprays in each nostril twice daily if needed.) 90 mL 1  . famotidine (PEPCID) 40 MG tablet Take 1 tablet (40 mg total) by mouth at bedtime. 30 tablet 11  . fluticasone (FLONASE) 50 MCG/ACT nasal spray Can use one spray in each nostril one to two times daily if needed. 48 g 1  . gabapentin (NEURONTIN) 300 MG capsule Take 1 capsule (300 mg total) by mouth 3 (three) times daily. 90 capsule 3  . glipiZIDE (GLUCOTROL XL) 5 MG 24 hr tablet Take 5 mg by mouth daily with breakfast.    . JANUVIA 100 MG tablet Take 100 mg by mouth daily after breakfast.     . Melatonin 10 MG TABS Take 10 mg by mouth at bedtime.    . metFORMIN (GLUCOPHAGE) 500 MG tablet Take 500-1,000 mg by mouth See admin instructions. Take 1 tablet (500 mg) by mouth after breakfast & take 2 tablets (1000 mg) by mouth after supper.    . montelukast (SINGULAIR) 10 MG tablet TAKE 1 TABLET BY MOUTH EVERY  DAY 30 tablet 0  . olmesartan-hydrochlorothiazide (BENICAR HCT) 20-12.5 MG tablet Take 1 tablet by mouth daily. 30 tablet 5  . omeprazole (PRILOSEC) 40 MG capsule TAKE 1 CAPSULE BY MOUTH EVERY DAY (Patient taking differently: Take 40 mg by mouth daily before breakfast. TAKE 1 CAPSULE BY MOUTH EVERY DAY) 30 capsule 11  . ONE TOUCH ULTRA TEST test strip USE AS DIRECTED TEST DAILY  11   No current facility-administered medications for this visit.    Physical Exam BP 129/62   Pulse 68   Temp 97.7 F (36.5 C) (Skin)   Resp 20   Ht 5\' 3"  (1.6 m)   Wt 120 lb (54.4 kg)   SpO2 97% Comment: RA  BMI 21.33 kg/m  74 year old woman in no acute distress Alert and oriented x3 with no focal deficits Lungs diminished at right base but otherwise clear Incisions well-healed Extreme sensitivity to touch over the right chest and shoulder and triceps area.  Diagnostic Tests: CHEST - 2  VIEW  COMPARISON:  05/28/2019  FINDINGS: Mild tracheal deviation to the right. Borderline cardiomegaly. Atherosclerosis in the transverse aorta. Right perihilar surgical sutures. Improvement in right sided volume loss with resolved areas of atelectasis. No new pulmonary opacity. Clear left lung.  IMPRESSION: Improved right-sided aeration, without acute disease.   Electronically Signed   By: Abigail Miyamoto M.D. I personally reviewed the chest x-ray images and concur with the findings noted above  Impression: Holly Harper is a 74 year old woman who is a lifelong non-smoker who was found to have a lung nodule when she was evaluated for a cough.  She had a thoracoscopic right upper lobectomy for what turned out to be a stage Ia (T1, N0) adenocarcinoma.  Postoperative atrial fibrillation-resolved.  She recently saw Dr. Bettina Gavia regarding that.  Stage Ia adenocarcinoma-she saw Dr. Bobby Rumpf and will follow up with him.  Postoperative pain/neuralgia-she has responded to gabapentin.  She is still hypersensitive to touch.  She says it is better but it has not resolved.  I recommend we try increasing her gabapentin to 300 mg 3 times a day and see if she tolerates that.  Plan: Return in 3 months with PA lateral chest x-ray  Melrose Nakayama, MD [facility] [phone]

## 2019-08-20 ENCOUNTER — Ambulatory Visit: Payer: Medicare Other | Admitting: Thoracic Surgery (Cardiothoracic Vascular Surgery)

## 2019-08-21 ENCOUNTER — Ambulatory Visit: Payer: Medicare Other | Attending: Internal Medicine

## 2019-08-21 ENCOUNTER — Other Ambulatory Visit: Payer: Self-pay | Admitting: Allergy and Immunology

## 2019-08-21 DIAGNOSIS — Z23 Encounter for immunization: Secondary | ICD-10-CM | POA: Insufficient documentation

## 2019-08-21 NOTE — Progress Notes (Signed)
   Covid-19 Vaccination Clinic  Name:  Holly Harper    MRN: [id number] DOB: 12-Nov-1945  08/21/2019  [name] was observed post Covid-19 immunization for 15 minutes without incident. She was provided with Vaccine Information Sheet and instruction to access the V-Safe system.   [name] was instructed to call 911 with any severe reactions post vaccine: Marland Kitchen Difficulty breathing  . Swelling of face and throat  . A fast heartbeat  . A bad rash all over body  . Dizziness and weakness   Immunizations Administered    Name Date Dose VIS Date Route   Pfizer COVID-19 Vaccine 08/21/2019 11:44 AM 0.3 mL 05/31/2019 Intramuscular   Manufacturer: [place]   Lot: [id number]   [place]: [id number]

## 2019-09-02 ENCOUNTER — Other Ambulatory Visit: Payer: Self-pay

## 2019-09-02 ENCOUNTER — Encounter: Payer: Self-pay | Admitting: Allergy and Immunology

## 2019-09-02 ENCOUNTER — Ambulatory Visit (INDEPENDENT_AMBULATORY_CARE_PROVIDER_SITE_OTHER): Payer: Medicare Other | Admitting: Allergy and Immunology

## 2019-09-02 VITALS — BP 122/62 | HR 71 | Resp 15

## 2019-09-02 DIAGNOSIS — J3089 Other allergic rhinitis: Secondary | ICD-10-CM | POA: Diagnosis not present

## 2019-09-02 DIAGNOSIS — J4530 Mild persistent asthma, uncomplicated: Secondary | ICD-10-CM

## 2019-09-02 DIAGNOSIS — R110 Nausea: Secondary | ICD-10-CM

## 2019-09-02 DIAGNOSIS — K219 Gastro-esophageal reflux disease without esophagitis: Secondary | ICD-10-CM | POA: Diagnosis not present

## 2019-09-02 NOTE — Progress Notes (Signed)
[facility]   Follow-up Note  Referring Provider: Garwin Brothers, MD Primary Provider: Garwin Brothers, MD Date of Office Visit: 09/02/2019  Subjective:   Blase Mess (DOB: [birth year]) is a 74 y.o. female who returns to the [facility] on 09/02/2019 in re-evaluation of the following:  HPI: Maryln returns to this clinic in evaluation of asthma and allergic rhinitis and LPR and a recently diagnosed lung non-small cell adenocarcinoma.  I last saw her in this clinic on 27 February 2019.  During her last visit we obtained a chest x-ray and a subsequent chest CT scan and PET scan resulting in a thoracotomy for a right upper apical stage Ia adenocarcinoma complicated by postsurgical nerve pain presently treated with gabapentin.  Her airway is doing quite well at this point in time.  She really does not use any short acting bronchodilator and she remains on montelukast on a regular basis and has tapered off her inhaled steroid.  She does not really exercise to any degree as her surgical experience has basically eliminated any exercise.  It does not sound as though she has required a systemic steroid or an antibiotic for her airway issue.  She has been nauseated.  She cannot tell if the nausea is from [place] or from the gabapentin.  She thinks it is from [place] but she is not entirely sure.  She has an appointment to see her primary care doctor sometime in the next few weeks. She does not really have much in the way of reflux while using her omeprazole 40 mg daily but she just has this persistent nausea.  She has had very little issues with her nose at this point.  She uses nasal azelastine commonly and occasionally some Flonase.  She did receive a set of Covid vaccinations.  Allergies as of 09/02/2019      Reactions   Amoxicillin Hives   Did it involve swelling of the face/tongue/throat, SOB, or low BP? No Did it involve sudden or  severe rash/hives, skin peeling, or any reaction on the inside of your mouth or nose? Yes Did you need to seek medical attention at a hospital or doctor's office? Yes When did it last happen? ~2015 If all above answers are "NO", may proceed with cephalosporin use.   Cinobac [cinoxacin] Other (See Comments)   Unknown    Macrodantin [nitrofurantoin Macrocrystal] Hives      Medication List      acetaminophen 325 MG tablet Commonly known as: TYLENOL Take 2 tablets (650 mg total) by mouth every 6 (six) hours as needed for mild pain.   albuterol 108 (90 Base) MCG/ACT inhaler Commonly known as: VENTOLIN HFA USE 2 INHALATIONS ORALLY   EVERY 4 TO 6 HOURS AS      NEEDED FOR COUGH OR WHEEZE   aspirin EC 81 MG tablet Take 1 tablet (81 mg total) by mouth daily.   atenolol 50 MG tablet Commonly known as: TENORMIN Take 1 tablet (50 mg total) by mouth daily.   atorvastatin 10 MG tablet Commonly known as: LIPITOR Take 10 mg by mouth at bedtime.   azelastine 0.1 % nasal spray Commonly known as: ASTELIN Can use two sprays in each nostril twice daily if needed.   famotidine 40 MG tablet Commonly known as: Pepcid Take 1 tablet (40 mg total) by mouth at bedtime.   Farxiga 10 MG Tabs tablet Generic drug: dapagliflozin propanediol Take 10 mg by mouth daily.  fluticasone 50 MCG/ACT nasal spray Commonly known as: FLONASE Can use one spray in each nostril one to two times daily if needed.   gabapentin 300 MG capsule Commonly known as: NEURONTIN Take 1 capsule (300 mg total) by mouth 3 (three) times daily.   glipiZIDE 5 MG 24 hr tablet Commonly known as: GLUCOTROL XL Take 5 mg by mouth daily with breakfast.   Januvia 100 MG tablet Generic drug: sitaGLIPtin Take 100 mg by mouth daily after breakfast.   Melatonin 10 MG Tabs Take 10 mg by mouth at bedtime.   metFORMIN 500 MG tablet Commonly known as: GLUCOPHAGE Take 500-1,000 mg by mouth See admin instructions. Take 1 tablet (500  mg) by mouth after breakfast & take 2 tablets (1000 mg) by mouth after supper.   montelukast 10 MG tablet Commonly known as: SINGULAIR TAKE 1 TABLET BY MOUTH EVERY DAY   olmesartan-hydrochlorothiazide 20-12.5 MG tablet Commonly known as: Benicar HCT Take 1 tablet by mouth daily.   omeprazole 40 MG capsule Commonly known as: PRILOSEC TAKE 1 CAPSULE BY MOUTH EVERY DAY   ONE TOUCH ULTRA TEST test strip Generic drug: glucose blood USE AS DIRECTED TEST DAILY       Past Medical History:  Diagnosis Date  . Allergic rhinitis   . Anxiety   . Asthma   . Diabetes ([place])   . Dysphagia   . Family history of adverse reaction to anesthesia    sister had PONV  . GERD (gastroesophageal reflux disease)   . Hiatal hernia   . Hypercholesterolemia   . Hypertension   . Lung cancer ([place]) 2020  . Paresthesia     Past Surgical History:  Procedure Laterality Date  . COLONOSCOPY  02/10/2016   Mild pancolonic diverticulosis. Otherwise, normal colonoscopy  . ESOPHAGOGASTRODUODENOSCOPY  08/24/2011   Prebyesophagus. Mininal hiatal hernia. Mild gastritis  . INTERCOSTAL NERVE BLOCK Right 05/09/2019   Procedure: Intercostal Nerve Block thoracic;  Surgeon: Melrose Nakayama, MD;  Location: [facility];  Service: Thoracic;  Laterality: Right;  . LASIK Bilateral   . LIPOMA RESECTION    . LOBECTOMY Right 05/09/2019   Procedure: LOBECTOMY;  Surgeon: Melrose Nakayama, MD;  Location: [facility];  Service: Thoracic;  Laterality: Right;  Marland Kitchen VIDEO ASSISTED THORACOSCOPY (VATS)/WEDGE RESECTION Right 05/09/2019   Procedure: VIDEO ASSISTED THORACOSCOPY (VATS)/WEDGE RESECTION FOR FROZEN SECTIONS ;  Surgeon: Melrose Nakayama, MD;  [facility];  Service: Thoracic;  Laterality: Right;    Review of systems negative except as noted in HPI / PMHx or noted below:  Review of Systems  Constitutional: Negative.   HENT: Negative.   Eyes: Negative.   Respiratory: Negative.   Cardiovascular: Negative.     Gastrointestinal: Negative.   Genitourinary: Negative.   Musculoskeletal: Negative.   Skin: Negative.   Neurological: Negative.   Endo/Heme/Allergies: Negative.   Psychiatric/Behavioral: Negative.      Objective:   Vitals:   09/02/19 1353  BP: 122/62  Pulse: 71  Resp: 15  SpO2: 96%          Physical Exam Constitutional:      Appearance: She is not diaphoretic.  HENT:     Head: Normocephalic.     Right Ear: Tympanic membrane, ear canal and external ear normal.     Left Ear: Tympanic membrane, ear canal and external ear normal.     Nose: Nose normal. No mucosal edema or rhinorrhea.     Mouth/Throat:     Pharynx: Uvula midline. No oropharyngeal exudate.  Eyes:     Conjunctiva/sclera: Conjunctivae normal.  Neck:     Thyroid: No thyromegaly.     Trachea: Trachea normal. No tracheal tenderness or tracheal deviation.  Cardiovascular:     Rate and Rhythm: Normal rate and regular rhythm.     Heart sounds: Normal heart sounds, S1 normal and S2 normal. No murmur.  Pulmonary:     Effort: No respiratory distress.     Breath sounds: Normal breath sounds. No stridor. No wheezing or rales.  Lymphadenopathy:     Head:     Right side of head: No tonsillar adenopathy.     Left side of head: No tonsillar adenopathy.     Cervical: No cervical adenopathy.  Skin:    Findings: No erythema or rash.     Nails: There is no clubbing.  Neurological:     Mental Status: She is alert.     Diagnostics:    Spirometry was performed and demonstrated an FEV1 of 1.26 at 61 % of predicted.  She had a less than optimal effort on the spirometric maneuver.  The patient had an Asthma Control Test with the following results: ACT Total Score: 25.    Assessment and Plan:   1. Asthma, well controlled, mild persistent   2. Other allergic rhinitis   3. LPRD (laryngopharyngeal reflux disease)   4. Nausea     1. Continue montelukast 10 mg daily  2. Continue omeprazole 40 mg daily. ADD  famotidine 40 mg in evening   3.  Continue Flonase + azelastine 1 spray each nostril 1-2 times per day during periods of upper airway symptoms  4. Continue ProAir HFA 2 puffs every 4-6 hours if needed  5. Determine if Wilder Glade is causing nausea  6. Return to clinic in 12 weeks or earlier if problem  Shacoya appears to be doing very well regarding her airway issue on her current medical plan as noted above which includes some anti-inflammatory agents and therapy directed against reflux.  She has nausea and this appears to temporally correlate with the start of her Wilder Glade use and I have asked her to determine if Wilder Glade is the cause of her nausea by eliminating the use of this medication for a week and then letting her primary care doctor know about her response to this medical manipulation.  Hopefully it is not the gabapentin causing this issue as gabapentin has resulted in very good control of her postthoracotomy chest discomfort.  I will see her back in this clinic in 12 weeks or earlier if there is a problem.  [name], MD Allergy / Immunology [facility]

## 2019-09-02 NOTE — Patient Instructions (Addendum)
  1. Continue montelukast 10 mg daily  2. Continue omeprazole 40 mg daily. ADD famotidine 40 mg in evening   3.  Continue Flonase + azelastine 1 spray each nostril 1-2 times per day during periods of upper airway symptoms  4. Continue ProAir HFA 2 puffs every 4-6 hours if needed  5. Determine if Wilder Glade is causing nausea  6. Return to clinic in 12 weeks or earlier if problem

## 2019-09-03 ENCOUNTER — Encounter: Payer: Self-pay | Admitting: Allergy and Immunology

## 2019-10-03 ENCOUNTER — Other Ambulatory Visit: Payer: Self-pay | Admitting: Cardiology

## 2019-10-03 MED ORDER — ATORVASTATIN CALCIUM 10 MG PO TABS: 10.0000 mg | ORAL_TABLET | Freq: Every day | ORAL | 2 refills | Status: DC

## 2019-10-03 NOTE — Telephone Encounter (Signed)
Rx refilled.

## 2019-10-03 NOTE — Telephone Encounter (Signed)
*  STAT* If patient is at the pharmacy, call can be transferred to refill team.   1. Which medications need to be refilled? (please list name of each medication and dose if known)  atorvastatin (LIPITOR) 10 MG tablet  2. Which pharmacy/location (including street and city if local pharmacy) is medication to be sent to? [id number] - [place], [place] - [address]  3. Do they need a 30 day or 90 day supply? 90 day supply   Patient only has 3 tablets left.

## 2019-10-09 DIAGNOSIS — C3411 Malignant neoplasm of upper lobe, right bronchus or lung: Secondary | ICD-10-CM

## 2019-10-11 ENCOUNTER — Telehealth: Payer: Self-pay

## 2019-10-11 NOTE — Telephone Encounter (Signed)
-----   Message from Irving Copas., MD sent at 10/11/2019 12:10 PM EDT ----- Regarding: Patient with Pancreatic Duct Dilation and ?[facility], I can review the documentation from Dr. Lavera Guise when it gets on my desk, but I was able to pull up the CT scan from the other hospital on PACS from a few days ago. Lung Cancer was an indication for a recent PET-CT in October (and looks like she has now undergone a lung resection) and although I do not see them mention the pancreas, I do think that duct dilation was present there at that time.  The subsequent CT scan done for follow up after her lung surgery showed the pancreatic lesion that let to the most recent CT. Either way, patient needs an EGD/EUS to evaluate the pancreatic duct more fully, exclude Chronic Pancreatitis and the mass that is being noted on the recent CT-Abdomen/Pelvis with possible biopsy. Patient can be scheduled with DJ or myself for EGD/EUS Linear next available for either. DJ, if you have any issues seeing images, let me know. Thank you Brixon Zhen. GM

## 2019-10-11 NOTE — Telephone Encounter (Signed)
Left message on machine to call back  

## 2019-10-14 ENCOUNTER — Encounter: Payer: Self-pay | Admitting: Allergy and Immunology

## 2019-10-14 ENCOUNTER — Other Ambulatory Visit: Payer: Self-pay

## 2019-10-14 ENCOUNTER — Ambulatory Visit (INDEPENDENT_AMBULATORY_CARE_PROVIDER_SITE_OTHER): Payer: Medicare Other | Admitting: Allergy and Immunology

## 2019-10-14 VITALS — BP 128/78 | HR 60 | Temp 98.2°F | Resp 16 | Ht 62.8 in | Wt 113.4 lb

## 2019-10-14 DIAGNOSIS — R948 Abnormal results of function studies of other organs and systems: Secondary | ICD-10-CM

## 2019-10-14 DIAGNOSIS — K219 Gastro-esophageal reflux disease without esophagitis: Secondary | ICD-10-CM | POA: Diagnosis not present

## 2019-10-14 DIAGNOSIS — J4530 Mild persistent asthma, uncomplicated: Secondary | ICD-10-CM | POA: Diagnosis not present

## 2019-10-14 DIAGNOSIS — J3089 Other allergic rhinitis: Secondary | ICD-10-CM | POA: Diagnosis not present

## 2019-10-14 NOTE — Telephone Encounter (Signed)
EUS scheduled on 11/14/19 at 930 am at [facility] with Dr Ardis Hughs. COVID test on 5/24 at 1015 am.  Instructions in Epic

## 2019-10-14 NOTE — Patient Instructions (Addendum)
  1. Continue montelukast 10 mg daily  2. Continue omeprazole 40 mg daily + famotidine 40 mg in evening   3.  Continue Flonase + azelastine 1 spray each nostril 1-2 times per day during periods of upper airway symptoms  4. Continue ProAir HFA 2 puffs every 4-6 hours if needed  5. Return to clinic in Summer 2021 or earlier if problem

## 2019-10-14 NOTE — Progress Notes (Signed)
[facility]   Follow-up Note  Referring Provider: Garwin Brothers, MD Primary Provider: Garwin Brothers, MD Date of Office Visit: 10/14/2019  Subjective:   Holly Harper (DOB: 06-29-1945) is a 74 y.o. female who returns to the [facility] on 10/14/2019 in re-evaluation of the following:  HPI: Holly Harper returns to this clinic in evaluation of asthma and allergic rhinitis and LPR.  Overall she hass really done well with her airway since her last visit.  She has not had any significant issues that have required attention or therapy regarding her airway.  She continues to use a combination of therapy directed against airway inflammation including montelukast on a consistent basis and some occasional nasal steroids.  When she was last seen in this clinic she was having some issues with nausea.  We added famotidine to omeprazole which may have helped her nausea somewhat.  We thought that Holly Harper may be responsible for her nausea but it actually turns out that it may be gabapentin.  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  She does get some relief when using gabapentin so she does not think that she can come off her gabapentin.  She is being evaluated for an enlarged pancreas.  She had a recent CT scan and she noticed that when she was receiving the CT scan dye she had a very warm feeling in her throat.  Since then she has had some tightness and some difficulty swallowing.  This is happened to her in the past and can sometimes extend out in time for a while.  She will be having an upper endoscopy with ERCP and pancreatic biopsy at some point in the near future to further evaluate her pancreatic mass.  Allergies as of 10/14/2019      Reactions   Amoxicillin Hives   Did it involve swelling of the face/tongue/throat, SOB, or low BP? No Did it involve sudden or severe  rash/hives, skin peeling, or any reaction on the inside of your mouth or nose? Yes Did you need to seek medical attention at a hospital or doctor's office? Yes When did it last happen? ~2015 If all above answers are "NO", may proceed with cephalosporin use.   Cinobac [cinoxacin] Other (See Comments)   Unknown    Macrodantin [nitrofurantoin Macrocrystal] Hives      Medication List    acetaminophen 325 MG tablet Commonly known as: TYLENOL Take 2 tablets (650 mg total) by mouth every 6 (six) hours as needed for mild pain.   albuterol 108 (90 Base) MCG/ACT inhaler Commonly known as: VENTOLIN HFA USE 2 INHALATIONS ORALLY   EVERY 4 TO 6 HOURS AS      NEEDED FOR COUGH OR WHEEZE   aspirin EC 81 MG tablet Take 1 tablet (81 mg total) by mouth daily.   atenolol 50 MG tablet Commonly known as: TENORMIN Take 1 tablet (50 mg total) by mouth daily.   atorvastatin 10 MG tablet Commonly known as: LIPITOR Take 1 tablet (10 mg total) by mouth at bedtime.   azelastine 0.1 % nasal spray Commonly known as: ASTELIN Can use two sprays in each nostril twice daily if needed.   famotidine 40 MG tablet Commonly known as: Pepcid Take 1 tablet (40 mg total) by mouth at bedtime.   Farxiga 10 MG Tabs tablet Generic drug: dapagliflozin propanediol Take 10 mg by mouth daily.  fluticasone 50 MCG/ACT nasal spray Commonly known as: FLONASE Can use one spray in each nostril one to two times daily if needed.   gabapentin 300 MG capsule Commonly known as: NEURONTIN Take 1 capsule (300 mg total) by mouth 3 (three) times daily.   Melatonin 10 MG Tabs Take 10 mg by mouth at bedtime.   metFORMIN 500 MG 24 hr tablet Commonly known as: GLUCOPHAGE-XR Take 500 mg by mouth 3 (three) times daily.   montelukast 10 MG tablet Commonly known as: SINGULAIR TAKE 1 TABLET BY MOUTH EVERY DAY   olmesartan-hydrochlorothiazide 20-12.5 MG tablet Commonly known as: Benicar HCT Take 1 tablet by mouth daily.     omeprazole 40 MG capsule Commonly known as: PRILOSEC TAKE 1 CAPSULE BY MOUTH EVERY DAY   ONE TOUCH ULTRA TEST test strip Generic drug: glucose blood USE AS DIRECTED TEST DAILY   Rybelsus 3 MG Tabs Generic drug: Semaglutide Take by mouth daily.       Past Medical History:  Diagnosis Date  . Allergic rhinitis   . Anxiety   . Asthma   . Diabetes ([place])   . Dysphagia   . Family history of adverse reaction to anesthesia    sister had PONV  . GERD (gastroesophageal reflux disease)   . Hiatal hernia   . Hypercholesterolemia   . Hypertension   . Lung cancer ([place]) 2020  . Paresthesia     Past Surgical History:  Procedure Laterality Date  . COLONOSCOPY  02/10/2016   Mild pancolonic diverticulosis. Otherwise, normal colonoscopy  . ESOPHAGOGASTRODUODENOSCOPY  08/24/2011   Prebyesophagus. Mininal hiatal hernia. Mild gastritis  . INTERCOSTAL NERVE BLOCK Right 05/09/2019   Procedure: Intercostal Nerve Block thoracic;  Surgeon: Melrose Nakayama, MD;  Location: [facility];  Service: Thoracic;  Laterality: Right;  . LASIK Bilateral   . LIPOMA RESECTION    . LOBECTOMY Right 05/09/2019   Procedure: LOBECTOMY;  Surgeon: Melrose Nakayama, MD;  Location: [facility];  Service: Thoracic;  Laterality: Right;  Marland Kitchen VIDEO ASSISTED THORACOSCOPY (VATS)/WEDGE RESECTION Right 05/09/2019   Procedure: VIDEO ASSISTED THORACOSCOPY (VATS)/WEDGE RESECTION FOR FROZEN SECTIONS ;  Surgeon: Melrose Nakayama, MD;  [facility];  Service: Thoracic;  Laterality: Right;    Review of systems negative except as noted in HPI / PMHx or noted below:  Review of Systems  Constitutional: Negative.   HENT: Negative.   Eyes: Negative.   Respiratory: Negative.   Cardiovascular: Negative.   Gastrointestinal: Negative.   Genitourinary: Negative.   Musculoskeletal: Negative.   Skin: Negative.   Neurological: Negative.   Endo/Heme/Allergies: Negative.   Psychiatric/Behavioral: Negative.      Objective:    Vitals:   10/14/19 1117  BP: 128/78  Pulse: 60  Resp: 16  Temp: 98.2 F (36.8 C)  SpO2: 97%   Height: 5' 2.8" (159.5 cm)  Weight: 113 lb 6.4 oz (51.4 kg)   Physical Exam Constitutional:      Appearance: She is not diaphoretic.  HENT:     Head: Normocephalic.     Right Ear: Tympanic membrane, ear canal and external ear normal.     Left Ear: Tympanic membrane, ear canal and external ear normal.     Nose: Nose normal. No mucosal edema or rhinorrhea.     Mouth/Throat:     Pharynx: Uvula midline. No oropharyngeal exudate.  Eyes:     Conjunctiva/sclera: Conjunctivae normal.  Neck:     Thyroid: No thyromegaly.     Trachea: Trachea normal. No tracheal  tenderness or tracheal deviation.  Cardiovascular:     Rate and Rhythm: Normal rate and regular rhythm.     Heart sounds: Normal heart sounds, S1 normal and S2 normal. No murmur.  Pulmonary:     Effort: No respiratory distress.     Breath sounds: Normal breath sounds. No stridor. No wheezing or rales.  Lymphadenopathy:     Head:     Right side of head: No tonsillar adenopathy.     Left side of head: No tonsillar adenopathy.     Cervical: No cervical adenopathy.  Skin:    Findings: No erythema or rash.     Nails: There is no clubbing.  Neurological:     Mental Status: She is alert.     Diagnostics:    Spirometry was performed and demonstrated an FEV1 of 1.76 at 85 % of predicted.   Assessment and Plan:   1. Asthma, well controlled, mild persistent   2. Other allergic rhinitis   3. LPRD (laryngopharyngeal reflux disease)     1. Continue montelukast 10 mg daily   2. Continue omeprazole 40 mg daily + famotidine 40 mg in evening   3.  Continue Flonase + azelastine 1 spray each nostril 1-2 times per day during periods of upper airway symptoms  4. Continue ProAir HFA 2 puffs every 4-6 hours if needed  5. Return to clinic in Summer 2021 or earlier if problem  Keesha appears to be doing well from an airway standpoint  and her reflux appears to be under pretty good control.  She has recently developed some issues with throat tightness and feeling as though she cannot swallow as well which has happened in the past but has been relatively invisible for a prolonged period in time.  This can be further evaluated during her upcoming endoscopy for her pancreatic mass.  I will see her back in this clinic in the summer 2021 or earlier if there is a problem.  Holly Katz, MD Allergy / Immunology [facility]

## 2019-10-15 ENCOUNTER — Encounter: Payer: Self-pay | Admitting: Allergy and Immunology

## 2019-10-15 ENCOUNTER — Other Ambulatory Visit: Payer: Self-pay | Admitting: Allergy and Immunology

## 2019-10-15 NOTE — Telephone Encounter (Signed)
Left message on machine to call back  

## 2019-10-16 NOTE — Telephone Encounter (Signed)
Left message on machine to call back  

## 2019-10-18 NOTE — Telephone Encounter (Signed)
EUS scheduled, pt instructed and medications reviewed.  Patient instructions mailed to home.  Patient to call with any questions or concerns.  

## 2019-10-28 ENCOUNTER — Telehealth: Payer: Self-pay | Admitting: Gastroenterology

## 2019-10-28 NOTE — Telephone Encounter (Signed)
I have called patient and left message for her to return my call.  

## 2019-10-28 NOTE — Telephone Encounter (Signed)
Patient called would like to discuss a few things prior to her procedure at the hospital please advise

## 2019-10-29 NOTE — Telephone Encounter (Signed)
I have left a message for patient to return my call.  

## 2019-10-30 ENCOUNTER — Telehealth: Payer: Self-pay | Admitting: Gastroenterology

## 2019-10-30 NOTE — Telephone Encounter (Signed)
I have called and left a message for patient to return my call.  

## 2019-10-30 NOTE — Telephone Encounter (Signed)
Left message on machine to call back  

## 2019-10-31 NOTE — Telephone Encounter (Signed)
Tried to reach pts on several occasions with no answer and no response to voice messages.  Will await further communication from the pt

## 2019-11-07 ENCOUNTER — Other Ambulatory Visit: Payer: Self-pay

## 2019-11-07 ENCOUNTER — Telehealth: Payer: Self-pay

## 2019-11-07 ENCOUNTER — Ambulatory Visit: Payer: Medicare Other | Attending: Oncology

## 2019-11-07 DIAGNOSIS — I890 Lymphedema, not elsewhere classified: Secondary | ICD-10-CM | POA: Insufficient documentation

## 2019-11-07 DIAGNOSIS — M25611 Stiffness of right shoulder, not elsewhere classified: Secondary | ICD-10-CM | POA: Diagnosis present

## 2019-11-07 DIAGNOSIS — R911 Solitary pulmonary nodule: Secondary | ICD-10-CM | POA: Diagnosis not present

## 2019-11-07 DIAGNOSIS — G8929 Other chronic pain: Secondary | ICD-10-CM | POA: Insufficient documentation

## 2019-11-07 DIAGNOSIS — R293 Abnormal posture: Secondary | ICD-10-CM | POA: Insufficient documentation

## 2019-11-07 DIAGNOSIS — M25511 Pain in right shoulder: Secondary | ICD-10-CM | POA: Insufficient documentation

## 2019-11-07 MED ORDER — NYSTATIN 100000 UNIT/ML MT SUSP: OROMUCOSAL | 0 refills | Status: DC

## 2019-11-07 NOTE — Telephone Encounter (Signed)
Patient states it is very hard for her to swallow like it has been previously. She is requesting that we refill her Nystatin prescription since this is what has helped her throat issues in the past. [facility] [place]   Dr. Neldon Mc notified and gave me the verbal okay to send in a refill of her Nystatin swish and swallow. Patient notified that the prescription would be sent in.

## 2019-11-07 NOTE — Therapy (Signed)
[place], [place], [address] Phone: [phone]   Fax:  [phone]  Physical Therapy Evaluation  Patient Details  Name: Holly Harper MRN: [id number] Date of Birth: 10-06-1945 Referring Provider (PT): Lavera Guise MD   Encounter Date: 11/07/2019  PT End of Session - 11/07/19 0910    Visit Number  1    Number of Visits  9    Date for PT Re-Evaluation  12/19/19    PT Start Time  0807    PT Stop Time  0905    PT Time Calculation (min)  58 min    Activity Tolerance  Patient tolerated treatment well    Behavior During Therapy  [facility] for tasks assessed/performed       Past Medical History:  Diagnosis Date  . Allergic rhinitis   . Anxiety   . Asthma   . Diabetes ([place])   . Dysphagia   . Family history of adverse reaction to anesthesia    sister had PONV  . GERD (gastroesophageal reflux disease)   . Hiatal hernia   . Hypercholesterolemia   . Hypertension   . Lung cancer ([place]) 2020  . Paresthesia     Past Surgical History:  Procedure Laterality Date  . COLONOSCOPY  02/10/2016   Mild pancolonic diverticulosis. Otherwise, normal colonoscopy  . ESOPHAGOGASTRODUODENOSCOPY  08/24/2011   Prebyesophagus. Mininal hiatal hernia. Mild gastritis  . INTERCOSTAL NERVE BLOCK Right 05/09/2019   Procedure: Intercostal Nerve Block thoracic;  Surgeon: Melrose Nakayama, MD;  Location: [place];  Service: Thoracic;  Laterality: Right;  . LASIK Bilateral   . LIPOMA RESECTION    . LOBECTOMY Right 05/09/2019   Procedure: LOBECTOMY;  Surgeon: Melrose Nakayama, MD;  Location: [place];  Service: Thoracic;  Laterality: Right;  Marland Kitchen VIDEO ASSISTED THORACOSCOPY (VATS)/WEDGE RESECTION Right 05/09/2019   Procedure: VIDEO ASSISTED THORACOSCOPY (VATS)/WEDGE RESECTION FOR FROZEN SECTIONS ;  Surgeon: Melrose Nakayama, MD;  Location: [place];  Service: Thoracic;  Laterality: Right;    There were no vitals filed  for this visit.   Subjective Assessment - 11/07/19 0808    Subjective  Pt reports that eversince surgery she has had pain from her R breast down into her R medial brachium. She states that she has still not been able to wear a bra due to pain. She states that she has increased pain with touch on the arm/breast. Pt states that pain increases when she lays down at night and then improves but starts to begin bothering her again when she stands up.    Pertinent History  thoracoscopic right upper lobectomy for a T1c, N0, stage Ia adenocarcinoma on 05/09/2019.    Patient Stated Goals  I want to decrease my pain and I want to be able to wear a bra.    Currently in Pain?  Yes    Pain Score  2     Pain Location  Axilla    Pain Orientation  Right;Medial    Pain Descriptors / Indicators  Burning;Numbness    Pain Type  Surgical pain;Chronic pain    Pain Radiating Towards  the medial brachium    Pain Onset  More than a month ago    Pain Frequency  Intermittent    Aggravating Factors   using the arm throughout the day.    Pain Relieving Factors  rest         [facility] PT Assessment - 11/07/19 0001  Assessment   Medical Diagnosis  R lung cancer     Referring Provider (PT)  Lavera Guise MD    Onset Date/Surgical Date  05/08/20    Hand Dominance  Right    Prior Therapy  None      Precautions   Precautions  Other (comment)    Precaution Comments  DM II, Hx lung cancer       Restrictions   Weight Bearing Restrictions  No      Balance Screen   Has the patient fallen in the past 6 months  No    Has the patient had a decrease in activity level because of a fear of falling?   Yes    Is the patient reluctant to leave their home because of a fear of falling?   No      Home Environment   Living Environment  Private residence    Living Arrangements  Alone    Type of [place] Access  Level entry    Additional Comments  stairs just from the upstiar to the downstairs but pt doesn't use  the stairs much .       Prior Function   Level of Independence  Independent    Vocation  Retired    Leisure  Social worker   Overall Cognitive Status  Within Functional Limits for tasks assessed      Sensation   Light Touch  Impaired Detail   pt has pain with touch over the lateral R breast     Coordination   Gross Motor Movements are Fluid and Coordinated  Yes      Posture/Postural Control   Posture/Postural Control  Postural limitations    Postural Limitations  Rounded Shoulders;Forward head      ROM / Strength   AROM / PROM / Strength  AROM      AROM   AROM Assessment Site  Shoulder    Right/Left Shoulder  Right;Left    Right Shoulder Flexion  123 Degrees    Right Shoulder ABduction  97 Degrees    Right Shoulder Internal Rotation  50 Degrees    Right Shoulder External Rotation  75 Degrees    Left Shoulder Flexion  142 Degrees    Left Shoulder ABduction  160 Degrees    Left Shoulder Internal Rotation  85 Degrees    Left Shoulder External Rotation  78 Degrees        LYMPHEDEMA/ONCOLOGY QUESTIONNAIRE - 11/07/19 0831      Type   Cancer Type  R lung cancer       Surgeries   Other Surgery Date  05/08/20    Number Lymph Nodes Removed  14      Treatment   Active Chemotherapy Treatment  No    Past Chemotherapy Treatment  No    Active Radiation Treatment  No    Past Radiation Treatment  No    Current Hormone Treatment  No    Past Hormone Therapy  No      What other symptoms do you have   Are you Having Heaviness or Tightness  Yes    Are you having Pain  Yes    Are you having pitting edema  No    Is it Hard or Difficult finding clothes that fit  No    Do you have infections  No    Is there Decreased scar mobility  Yes  Lymphedema Assessments   Lymphedema Assessments  Upper extremities      Right Upper Extremity Lymphedema   15 cm Proximal to Olecranon Process  23.7 cm    Olecranon Process  20.4 cm    15 cm Proximal to Ulnar Styloid Process  19.8  cm    Just Proximal to Ulnar Styloid Process  13.7 cm    Across Hand at [employer]  16.9 cm    At [place] of 2nd Digit  5.2 cm    Other  83 cm right over the nipple line in standing after full exhale.       Left Upper Extremity Lymphedema   15 cm Proximal to Olecranon Process  23.3 cm    Olecranon Process  22.1 cm    15 cm Proximal to Ulnar Styloid Process  19.4 cm    Just Proximal to Ulnar Styloid Process  13.5 cm    Across Hand at [employer]  18 cm    At [place] of 2nd Digit  5.1 cm            Quick Dash - 11/07/19 0001    Open a tight or new jar  Unable    Do heavy household chores (wash walls, wash floors)  Moderate difficulty    Carry a shopping bag or briefcase  Moderate difficulty    Wash your back  Moderate difficulty    Use a knife to cut food  No difficulty    Recreational activities in which you take some force or impact through your arm, shoulder, or hand (golf, hammering, tennis)  Mild difficulty    During the past week, to what extent has your arm, shoulder or hand problem interfered with your normal social activities with family, friends, neighbors, or groups?  Quite a bit    During the past week, to what extent has your arm, shoulder or hand problem limited your work or other regular daily activities  Modererately    Arm, shoulder, or hand pain.  Moderate    Tingling (pins and needles) in your arm, shoulder, or hand  Mild    Difficulty Sleeping  Moderate difficulty    DASH Score  47.73 %        Objective measurements completed on examination: See above findings.      [facility] Adult PT Treatment/Exercise - 11/07/19 0001      Exercises   Exercises  Other Exercises    Other Exercises   post-op exercises with instructions.       Manual Therapy   Manual Therapy  Edema management    Edema Management  Edema taping with 2 fanned I strips over the lateral R breast toward posterior anastomosis and axillo-inguinal anastomosis              PT Education  - 11/07/19 0908    Education Details  Pt was educated on post op exercises. DIscussed lymphedema and pt risk for lymphedema with 14 lymph node removal. Pt was educated on the purpose of the tape and when to remove including any itching, redness or pain. She can leave on for up to 3 days but then it needs to be removed by rolling tape off to preserve skin integrity. Pt was educated on the importance of avoiding heat on the tape to decrease risk for skin tears when removing. Pt was educated on the anatomy and physiology of the lymphatic system and discussed tight fascial tissues/joint stiffness and weakness following disuse after surgery.  Person(s) Educated  Patient    Methods  Explanation;Demonstration;Verbal cues;Handout    Comprehension  Verbalized understanding;Returned demonstration       PT Short Term Goals - 11/07/19 0917      PT SHORT TERM GOAL #1   Title  Pt will be independent with HEP and self MLD within 2 weeks for autonomy of care.    Baseline  Pt was just provided with HEP and does not know how to perform MLD> 2    Time  2    Period  Weeks    Status  New    Target Date  11/28/19        PT Long Term Goals - 11/07/19 0917      PT LONG TERM GOAL #1   Title  Pt will improve R shoulder flexion to 140, abduction 120, interal rotation to 70 degrees within 4 weeks to demonstrate improve functional ROM.    Baseline  R shoulder flexion: 123, abduction: 97, internal rotation 50    Time  4    Period  Weeks    Status  New    Target Date  12/19/19      PT LONG TERM GOAL #2   Title  Pt will improve DASH score to less than 20% disability for the RUE after 4 weeks to demonstrate a subjective improvement in R shoulder function.    Baseline  47%    Time  4    Period  Weeks    Status  New    Target Date  12/19/19      PT LONG TERM GOAL #3   Title  Pt will report 50% improvement or greater in pain and function of the RUE after 4 weeks in order to demonstrate a subjective improvement  in quality of life.    Baseline  2/10 pain and greater with prolonged activity    Time  4    Period  Weeks    Status  New    Target Date  12/19/19      PT LONG TERM GOAL #4   Title  Pt will demonstrate 2cm reduction in thoracic measurement to demonstrate decreased fluid in the posterior quadrants within 4 weeks.    Baseline  see measurements.    Time  4    Period  Weeks    Status  New    Target Date  12/19/19      PT LONG TERM GOAL #5   Title  Pt will be measured for and obtain appropriate compression garments to wear on a dialy basis to control edema at home.    Baseline  pt has no compression garments.    Time  4    Period  Weeks    Status  New    Target Date  12/19/19             Plan - 11/07/19 0911    Clinical Impression Statement  Pt presents to physical therapy about 6 months post R lobectomy with 14 lymph node removal on the R. She has had pain in the R axilla down into the R brachium since that time. She has decreased circumferential measuremnets on the R compared to the L most likely due to disuse of the RUE secondary to pain and movement restrictions. Pt demonstrates significant loss of ROM of the R shoulder compared to the L. No cording noted this session but fascial adhesions noted in the L axilla with both flexion and abduction. Pt demonstrates  edema in the R lateral breast and significant edema in the R posterior upper and upper lower quadrant. Pt asked about taping and edema taping was performed this session over that side. She does state that holding her breast feels good but if she can't put pressure on it then it feels better to have nothing on it. She demonstrates significant kyphotic posture with forward head and anteriorly rolled shoulders. Pt will benefit from skilled physical therapy services to address the above mentioned deficits 2x/week for 4 weeks in order to decrease risk for immobility and infection related to edema.    Personal Factors and Comorbidities   Comorbidity 1    Comorbidities  R lobectomy with 14 lymph node removal on 05/09/2019    Stability/Clinical Decision Making  Stable/Uncomplicated    Clinical Decision Making  Low    Rehab Potential  Good    PT Frequency  2x / week    PT Duration  4 weeks    PT Treatment/Interventions  Therapeutic activities;Therapeutic exercise;Neuromuscular re-education;Moist Heat;Iontophoresis 4mg /ml Dexamethasone;Manual techniques    PT Next Visit Plan  Begin MLD, assess taping, (teach taping if appropriate) P/ROM, assess exercises, easy myofascial release.    PT Home Exercise Plan  post op exercises    Recommended Other Services  compression vest? possibly a pump    Consulted and Agree with Plan of Care  Patient       Patient will benefit from skilled therapeutic intervention in order to improve the following deficits and impairments:  Increased edema, Increased fascial restricitons, Decreased range of motion, Postural dysfunction, Pain  Visit Diagnosis: Solitary pulmonary nodule on lung CT  Stiffness of right shoulder, not elsewhere classified  Chronic right shoulder pain  Lymphedema, not elsewhere classified  Abnormal posture     Problem List Patient Active Problem List   Diagnosis Date Noted  . S/P thoracotomy 05/09/2019  . Solitary pulmonary nodule on lung CT 04/15/2019  . Upper airway cough syndrome 04/15/2019  . Mixed hyperlipidemia 02/12/2019  . Type 2 diabetes mellitus without complication, without long-term current use of insulin ([place]) 02/12/2019  . Palpitations 02/12/2019  . Subungual hematoma of toenail of left foot 06/21/2016  . Diabetes mellitus without complication ([place]) 17/49/4496  . DIABETES MELLITUS, TYPE II 11/03/2007  . Essential hypertension 11/03/2007  . ALLERGIC RHINITIS 11/03/2007  . ESOPHAGEAL REFLUX 11/03/2007    Ander Purpura, PT 11/07/2019, 9:24 AM  [place],  [place], [address] Phone: [phone]   Fax:  [phone]  Name: Holly Harper MRN: [id number] Date of Birth: 03/16/1946

## 2019-11-08 ENCOUNTER — Telehealth: Payer: Self-pay | Admitting: Gastroenterology

## 2019-11-08 NOTE — Telephone Encounter (Signed)
Ok, thanks.

## 2019-11-08 NOTE — Telephone Encounter (Signed)
Pt is scheduled for a hospital procedure 11/14/19 and would like to give an update on her health condition.

## 2019-11-08 NOTE — Telephone Encounter (Signed)
Dr. Jacobs notified. 

## 2019-11-11 ENCOUNTER — Other Ambulatory Visit (HOSPITAL_COMMUNITY)
Admission: RE | Admit: 2019-11-11 | Discharge: 2019-11-11 | Disposition: A | Discharge status: Home / Self Care | Payer: Medicare Other | Source: Ambulatory Visit | Attending: Gastroenterology | Admitting: Gastroenterology

## 2019-11-11 ENCOUNTER — Other Ambulatory Visit: Payer: Self-pay | Admitting: Thoracic Surgery (Cardiothoracic Vascular Surgery)

## 2019-11-11 DIAGNOSIS — Z9889 Other specified postprocedural states: Secondary | ICD-10-CM

## 2019-11-11 DIAGNOSIS — Z01812 Encounter for preprocedural laboratory examination: Secondary | ICD-10-CM | POA: Diagnosis present

## 2019-11-11 DIAGNOSIS — Z20822 Contact with and (suspected) exposure to covid-19: Secondary | ICD-10-CM | POA: Diagnosis not present

## 2019-11-11 LAB — SARS CORONAVIRUS 2 (TAT 6-24 HRS): SARS Coronavirus 2: NEGATIVE

## 2019-11-11 NOTE — Progress Notes (Signed)
Cardiology Office Note:    Date:  11/12/2019   ID:  Holly Harper, DOB April 20, 1946, MRN [id number]  PCP:  Garwin Brothers, MD  Cardiologist:  Shirlee More, MD    Referring MD: Garwin Brothers, MD    ASSESSMENT:    1. Paroxysmal atrial fibrillation (HCC)   2. Essential hypertension    PLAN:    In order of problems listed above:  1. Atrial fibrillation was isolated in the setting of thoracic surgery and now think she requires long-term antiarrhythmic drugs or anticoagulation.  We will do an EKG today give her a copy and have her hand.  To the endoscopic procedure with her 2. BP previously difficult to control now at target continue current treatment including ARB thiazide diuretic and beta-blocker. 3. Continue statin for hyperlipidemia 4. Stable diabetes managed by her PCP   Next appointment: Needed   Medication Adjustments/Labs and Tests Ordered: Current medicines are reviewed at length with the patient today.  Concerns regarding medicines are outlined above.  No orders of the defined types were placed in this encounter.  No orders of the defined types were placed in this encounter.   Chief Complaint  Patient presents with  . Follow-up    3 MO FU   . Atrial Fibrillation    History of Present Illness:    Holly Harper is a 74 y.o. female with a hx of  difficult to control hypertension and lung cancer. I reviewed the [facility] records from her admission to the hospital 05/09/2019 at 05/15/2019 with resection of adenocarcinoma right upper lobe T1N0 stage Ia with thoracotomy.  Her course was complicated by sinus tachycardia hypotension requiring IV fluids and a small air leak in her chest tube. She developed atrial fibrillation and was treated with amiodarone and converted to sinus rhythm.  She was seen by thoracic surgery today 6 months remote from operative intervention and no evidence of recurrent disease.  Scheduled for upper endoscopy 11/14/2019 She was last seen  09/09/2019. Compliance with diet, lifestyle and medications: Yes  From our perspective she is doing well no indication of recurrent atrial fibrillation BP at target and she is not having cardiovascular symptoms of palpitations syncope shortness of breath edema or chest pain.  She has an abnormality on CT scan and is undergoing upper endoscopy with pancreas ultrasound and biopsy on Monday do an EKG today and give her a copy to hand carry with her.  At this time I can see her back in the future as needed. Past Medical History:  Diagnosis Date  . Allergic rhinitis   . Anxiety   . Asthma   . Diabetes ([place])   . Dysphagia   . Family history of adverse reaction to anesthesia    sister had PONV  . GERD (gastroesophageal reflux disease)   . Hiatal hernia   . Hypercholesterolemia   . Hypertension   . Lung cancer ([place]) 2020  . Paresthesia     Past Surgical History:  Procedure Laterality Date  . COLONOSCOPY  02/10/2016   Mild pancolonic diverticulosis. Otherwise, normal colonoscopy  . ESOPHAGOGASTRODUODENOSCOPY  08/24/2011   Prebyesophagus. Mininal hiatal hernia. Mild gastritis  . INTERCOSTAL NERVE BLOCK Right 05/09/2019   Procedure: Intercostal Nerve Block thoracic;  Surgeon: Melrose Nakayama, MD;  Location: [place];  Service: Thoracic;  Laterality: Right;  . LASIK Bilateral   . LIPOMA RESECTION    . LOBECTOMY Right 05/09/2019   Procedure: LOBECTOMY;  Surgeon: Melrose Nakayama, MD;  Location[facility]  OR;  Service: Thoracic;  Laterality: Right;  Marland Kitchen VIDEO ASSISTED THORACOSCOPY (VATS)/WEDGE RESECTION Right 05/09/2019   Procedure: VIDEO ASSISTED THORACOSCOPY (VATS)/WEDGE RESECTION FOR FROZEN SECTIONS ;  Surgeon: Melrose Nakayama, MD;  [facility];  Service: Thoracic;  Laterality: Right;    Current Medications: Current Meds  Medication Sig  . acetaminophen (TYLENOL) 325 MG tablet Take 2 tablets (650 mg total) by mouth every 6 (six) hours as needed for mild pain. (Patient taking  differently: Take 325 mg by mouth 2 (two) times daily as needed for mild pain. )  . albuterol (VENTOLIN HFA) 108 (90 Base) MCG/ACT inhaler USE 2 INHALATIONS ORALLY   EVERY 4 TO 6 HOURS AS      NEEDED FOR COUGH OR WHEEZE (Patient taking differently: Inhale 2 puffs into the lungs every 4 (four) hours as needed for wheezing or shortness of breath. )  . aspirin EC 81 MG tablet Take 1 tablet (81 mg total) by mouth daily.  Marland Kitchen atorvastatin (LIPITOR) 10 MG tablet Take 1 tablet (10 mg total) by mouth at bedtime.  Marland Kitchen azelastine (ASTELIN) 0.1 % nasal spray Can use two sprays in each nostril twice daily if needed. (Patient taking differently: Place 1 spray into both nostrils 2 (two) times daily as needed for rhinitis or allergies. )  . Carboxymethylcellul-Glycerin (LUBRICATING EYE DROPS OP) Place 1 drop into both eyes daily as needed (dry eyes).  . dapagliflozin propanediol (FARXIGA) 5 MG TABS tablet Take 5 mg by mouth daily.  . famotidine (PEPCID) 40 MG tablet Take 1 tablet (40 mg total) by mouth at bedtime.  . fluticasone (FLONASE) 50 MCG/ACT nasal spray Can use one spray in each nostril one to two times daily if needed. (Patient taking differently: Place 1 spray into both nostrils daily as needed for allergies. )  . gabapentin (NEURONTIN) 300 MG capsule Take 1 capsule (300 mg total) by mouth 3 (three) times daily. (Patient taking differently: Take 300 mg by mouth in the morning and at bedtime. )  . glipiZIDE (GLUCOTROL XL) 5 MG 24 hr tablet Take 5 mg by mouth daily with breakfast.  . Melatonin 12 MG TBDP Take 12 mg by mouth at bedtime.  . metFORMIN (GLUCOPHAGE-XR) 500 MG 24 hr tablet Take 500 mg by mouth 3 (three) times daily.  . montelukast (SINGULAIR) 10 MG tablet TAKE 1 TABLET BY MOUTH EVERY DAY (Patient taking differently: Take 10 mg by mouth daily. )  . nystatin (MYCOSTATIN) 100000 UNIT/ML suspension Swish and swallow 37mL's by mouth three times daily for 10 days  . olmesartan-hydrochlorothiazide (BENICAR  HCT) 20-12.5 MG tablet Take 1 tablet by mouth daily.  Marland Kitchen omeprazole (PRILOSEC) 40 MG capsule TAKE 1 CAPSULE BY MOUTH EVERY DAY (Patient taking differently: Take 40 mg by mouth daily. )  . sitaGLIPtin (JANUVIA) 100 MG tablet Take 100 mg by mouth daily.     Allergies:   Amoxicillin, Cinobac [cinoxacin], Macrodantin [nitrofurantoin macrocrystal], and Semaglutide   Social History   Socioeconomic History  . Marital status: Married    Spouse name: Not on file  . Number of children: Not on file  . Years of education: Not on file  . Highest education level: Not on file  Occupational History  . Not on file  Tobacco Use  . Smoking status: Never Smoker  . Smokeless tobacco: Never Used  Substance and Sexual Activity  . Alcohol use: Not Currently    Comment: quit drinking years ago   . Drug use: Not Currently  . Sexual  activity: Not on file  Other Topics Concern  . Not on file  Social History Narrative  . Not on file   Social Determinants of Health   Financial Resource Strain:   . Difficulty of Paying Living Expenses:   Food Insecurity:   . Worried About Charity fundraiser in the Last Year:   . Arboriculturist in the Last Year:   Transportation Needs:   . Film/video editor (Medical):   Marland Kitchen Lack of Transportation (Non-Medical):   Physical Activity:   . Days of Exercise per Week:   . Minutes of Exercise per Session:   Stress:   . Feeling of Stress :   Social Connections:   . Frequency of Communication with Friends and Family:   . Frequency of Social Gatherings with Friends and Family:   . Attends Religious Services:   . Active Member of Clubs or Organizations:   . Attends Archivist Meetings:   Marland Kitchen Marital Status:      Family History: The patient's family history includes Bipolar disorder in her sister; Lung cancer in her brother; Prostate cancer in her father. There is no history of Colon cancer or Esophageal cancer. ROS:   Please see the history of present  illness.    All other systems reviewed and are negative.  EKGs/Labs/Other Studies Reviewed:    The following studies were reviewed today:  EKG:  EKG ordered today and personally reviewed.  The ekg ordered today demonstrates sinus rhythm we will contact with V6 due to chest wall pain otherwise normal  Recent Labs: 02/13/2019: TSH 0.874 05/07/2019: ALT 25 05/11/2019: BUN 14; Creatinine, Ser 0.91; Hemoglobin 8.5; Platelets 150; Potassium 4.2; Sodium 135  Recent Lipid Panel No results found for: CHOL, TRIG, HDL, CHOLHDL, VLDL, LDLCALC, LDLDIRECT  Physical Exam:    VS:  BP (!) 100/58   Pulse 85   Ht 5\' 3"  (1.6 m)   Wt 114 lb (51.7 kg)   SpO2 97%   BMI 20.19 kg/m     Wt Readings from Last 3 Encounters:  11/12/19 114 lb (51.7 kg)  11/12/19 114 lb 3.2 oz (51.8 kg)  10/14/19 113 lb 6.4 oz (51.4 kg)     GEN: Looks quite thin well nourished, well developed in no acute distress HEENT: Normal NECK: No JVD; No carotid bruits LYMPHATICS: No lymphadenopathy CARDIAC: RRR, no murmurs, rubs, gallops RESPIRATORY:  Clear to auscultation without rales, wheezing or rhonchi  ABDOMEN: Soft, non-tender, non-distended MUSCULOSKELETAL:  No edema; No deformity  SKIN: Warm and dry NEUROLOGIC:  Alert and oriented x 3 PSYCHIATRIC:  Normal affect    Signed, Shirlee More, MD  11/12/2019 2:17 PM    [facility]

## 2019-11-12 ENCOUNTER — Ambulatory Visit (INDEPENDENT_AMBULATORY_CARE_PROVIDER_SITE_OTHER): Payer: Medicare Other | Admitting: Cardiology

## 2019-11-12 ENCOUNTER — Encounter: Payer: Self-pay | Admitting: Thoracic Surgery (Cardiothoracic Vascular Surgery)

## 2019-11-12 ENCOUNTER — Ambulatory Visit (INDEPENDENT_AMBULATORY_CARE_PROVIDER_SITE_OTHER): Payer: Medicare Other | Admitting: Thoracic Surgery (Cardiothoracic Vascular Surgery)

## 2019-11-12 ENCOUNTER — Other Ambulatory Visit: Payer: Self-pay

## 2019-11-12 ENCOUNTER — Encounter: Payer: Self-pay | Admitting: Cardiology

## 2019-11-12 VITALS — BP 100/58 | HR 85 | Ht 63.0 in | Wt 114.0 lb

## 2019-11-12 VITALS — BP 123/64 | HR 72 | Temp 97.9°F | Resp 18 | Ht 63.0 in | Wt 114.2 lb

## 2019-11-12 DIAGNOSIS — I10 Essential (primary) hypertension: Secondary | ICD-10-CM | POA: Diagnosis not present

## 2019-11-12 DIAGNOSIS — C3491 Malignant neoplasm of unspecified part of right bronchus or lung: Secondary | ICD-10-CM | POA: Diagnosis not present

## 2019-11-12 DIAGNOSIS — I480 Paroxysmal atrial fibrillation: Secondary | ICD-10-CM

## 2019-11-12 NOTE — Patient Instructions (Signed)

## 2019-11-12 NOTE — H&P (View-Only) (Signed)
[address]       [place] [address]             [phone]      HPI: Ms. Nelon returns for a scheduled follow-up visit  Holly Harper is a 74 year old woman who had a thoracoscopic right upper lobectomy and node dissection for stage Ia non-small cell carcinoma in November 2020.  She had postoperative atrial fibrillation which resolved with amiodarone.  That was later stopped due to nausea.  Last saw her in the office in February.  She was about 3 months out from surgery.  She was still having a lot of pain in her right shoulder and axilla area.  She had a lot of hypersensitivity of the skin over her right breast.  I recommended increasing gabapentin to 300 mg 3 times daily but she continued to take it twice daily.  She had some physical therapy a couple of weeks ago and they did physio taping on the right chest and shoulder.  She says that she felt like that helped a lot.  She still is taking gabapentin 300 mg twice daily.  She is not using any narcotics.  Overall her pain has improved significantly but has not completely resolved.  Past Medical History:  Diagnosis Date  . Allergic rhinitis   . Anxiety   . Asthma   . Diabetes ([place])   . Dysphagia   . Family history of adverse reaction to anesthesia    sister had PONV  . GERD (gastroesophageal reflux disease)   . Hiatal hernia   . Hypercholesterolemia   . Hypertension   . Lung cancer ([place]) 2020  . Paresthesia     Current Outpatient Medications  Medication Sig Dispense Refill  . acetaminophen (TYLENOL) 325 MG tablet Take 2 tablets (650 mg total) by mouth every 6 (six) hours as needed for mild pain. (Patient taking differently: Take 325 mg by mouth 2 (two) times daily as needed for mild pain. )    . albuterol (VENTOLIN HFA) 108 (90 Base) MCG/ACT inhaler USE 2 INHALATIONS ORALLY   EVERY 4 TO 6 HOURS AS      NEEDED FOR COUGH OR WHEEZE (Patient taking differently: Inhale 2 puffs into the lungs every 4  (four) hours as needed for wheezing or shortness of breath. ) 51 g 0  . aspirin EC 81 MG tablet Take 1 tablet (81 mg total) by mouth daily. 90 tablet 3  . atorvastatin (LIPITOR) 10 MG tablet Take 1 tablet (10 mg total) by mouth at bedtime. 90 tablet 2  . azelastine (ASTELIN) 0.1 % nasal spray Can use two sprays in each nostril twice daily if needed. (Patient taking differently: Place 1 spray into both nostrils 2 (two) times daily as needed for rhinitis or allergies. ) 90 mL 1  . Carboxymethylcellul-Glycerin (LUBRICATING EYE DROPS OP) Place 1 drop into both eyes daily as needed (dry eyes).    . cetirizine (ZYRTEC) 10 MG tablet Take 10 mg by mouth daily as needed for allergies.    . dapagliflozin propanediol (FARXIGA) 5 MG TABS tablet Take 5 mg by mouth daily.    . famotidine (PEPCID) 40 MG tablet Take 1 tablet (40 mg total) by mouth at bedtime. 30 tablet 11  . fluticasone (FLONASE) 50 MCG/ACT nasal spray Can use one spray in each nostril one to two times daily if needed. (Patient taking differently: Place 1 spray into both nostrils daily as needed for allergies. ) 48 g  1  . gabapentin (NEURONTIN) 300 MG capsule Take 1 capsule (300 mg total) by mouth 3 (three) times daily. (Patient taking differently: Take 300 mg by mouth in the morning and at bedtime. ) 90 capsule 3  . glipiZIDE (GLUCOTROL XL) 5 MG 24 hr tablet Take 5 mg by mouth daily with breakfast.    . Melatonin 12 MG TBDP Take 12 mg by mouth at bedtime.    . metFORMIN (GLUCOPHAGE-XR) 500 MG 24 hr tablet Take 500 mg by mouth 3 (three) times daily.    . montelukast (SINGULAIR) 10 MG tablet TAKE 1 TABLET BY MOUTH EVERY DAY (Patient taking differently: Take 10 mg by mouth daily. ) 30 tablet 3  . nystatin (MYCOSTATIN) 100000 UNIT/ML suspension Swish and swallow 77mL's by mouth three times daily for 10 days 160 mL 0  . olmesartan-hydrochlorothiazide (BENICAR HCT) 20-12.5 MG tablet Take 1 tablet by mouth daily. 30 tablet 5  . omeprazole (PRILOSEC) 40 MG  capsule TAKE 1 CAPSULE BY MOUTH EVERY DAY (Patient taking differently: Take 40 mg by mouth daily. ) 30 capsule 11  . sitaGLIPtin (JANUVIA) 100 MG tablet Take 100 mg by mouth daily.    Marland Kitchen atenolol (TENORMIN) 50 MG tablet Take 1 tablet (50 mg total) by mouth daily. 90 tablet 3   No current facility-administered medications for this visit.    Physical Exam BP 123/64 (BP Location: Left Arm, Patient Position: Sitting, Cuff Size: Small)   Pulse 72   Temp 97.9 F (36.6 C)   Resp 18   Ht 5\' 3"  (1.6 m)   Wt 114 lb 3.2 oz (51.8 kg)   SpO2 98% Comment: RA  BMI 20.24 kg/m  74 year old woman in no acute distress Alert and oriented x3 with no focal deficits Lungs diminished at right base but otherwise clear Incisions well-healed.  No chest wall deformity.  Diagnostic Tests: She had a chest x-ray at [facility] last week.  Impression: Holly Harper is a 74 year old woman who had a thoracoscopic right upper lobectomy for stage Ia non-small cell carcinoma in November 2020.  She is now 6 months out from surgery.  She has no evidence of recurrent disease.  She continues to have some pain in the right shoulder, axilla and arm region.  That has improved significantly but has not completely resolved.  She recently started physical therapy and that seems to be helping.  She remains on gabapentin, and I think she should stay on that until all of the neuropathic pain is resolved.  She is following with Dr. Bobby Rumpf in [place].  I will plan to see her back in 6 months for 1 year follow-up  Plan: Return in 6 months with PA lateral chest x-ray  I spent 10 minutes in review of records and meeting with Ms. Zaucha this morning. Melrose Nakayama, MD [facility] [phone]

## 2019-11-12 NOTE — Progress Notes (Signed)
[address]       [place] [address]             [phone]      HPI: Ms. Holly Harper returns for a scheduled follow-up visit  Holly Harper is a 74 year old woman who had a thoracoscopic right upper lobectomy and node dissection for stage Ia non-small cell carcinoma in November 2020.  She had postoperative atrial fibrillation which resolved with amiodarone.  That was later stopped due to nausea.  Last saw her in the office in February.  She was about 3 months out from surgery.  She was still having a lot of pain in her right shoulder and axilla area.  She had a lot of hypersensitivity of the skin over her right breast.  I recommended increasing gabapentin to 300 mg 3 times daily but she continued to take it twice daily.  She had some physical therapy a couple of weeks ago and they did physio taping on the right chest and shoulder.  She says that she felt like that helped a lot.  She still is taking gabapentin 300 mg twice daily.  She is not using any narcotics.  Overall her pain has improved significantly but has not completely resolved.  Past Medical History:  Diagnosis Date  . Allergic rhinitis   . Anxiety   . Asthma   . Diabetes ([place])   . Dysphagia   . Family history of adverse reaction to anesthesia    sister had PONV  . GERD (gastroesophageal reflux disease)   . Hiatal hernia   . Hypercholesterolemia   . Hypertension   . Lung cancer ([place]) 2020  . Paresthesia     Current Outpatient Medications  Medication Sig Dispense Refill  . acetaminophen (TYLENOL) 325 MG tablet Take 2 tablets (650 mg total) by mouth every 6 (six) hours as needed for mild pain. (Patient taking differently: Take 325 mg by mouth 2 (two) times daily as needed for mild pain. )    . albuterol (VENTOLIN HFA) 108 (90 Base) MCG/ACT inhaler USE 2 INHALATIONS ORALLY   EVERY 4 TO 6 HOURS AS      NEEDED FOR COUGH OR WHEEZE (Patient taking differently: Inhale 2 puffs into the lungs every 4  (four) hours as needed for wheezing or shortness of breath. ) 51 g 0  . aspirin EC 81 MG tablet Take 1 tablet (81 mg total) by mouth daily. 90 tablet 3  . atorvastatin (LIPITOR) 10 MG tablet Take 1 tablet (10 mg total) by mouth at bedtime. 90 tablet 2  . azelastine (ASTELIN) 0.1 % nasal spray Can use two sprays in each nostril twice daily if needed. (Patient taking differently: Place 1 spray into both nostrils 2 (two) times daily as needed for rhinitis or allergies. ) 90 mL 1  . Carboxymethylcellul-Glycerin (LUBRICATING EYE DROPS OP) Place 1 drop into both eyes daily as needed (dry eyes).    . cetirizine (ZYRTEC) 10 MG tablet Take 10 mg by mouth daily as needed for allergies.    . dapagliflozin propanediol (FARXIGA) 5 MG TABS tablet Take 5 mg by mouth daily.    . famotidine (PEPCID) 40 MG tablet Take 1 tablet (40 mg total) by mouth at bedtime. 30 tablet 11  . fluticasone (FLONASE) 50 MCG/ACT nasal spray Can use one spray in each nostril one to two times daily if needed. (Patient taking differently: Place 1 spray into both nostrils daily as needed for allergies. ) 48 g  1  . gabapentin (NEURONTIN) 300 MG capsule Take 1 capsule (300 mg total) by mouth 3 (three) times daily. (Patient taking differently: Take 300 mg by mouth in the morning and at bedtime. ) 90 capsule 3  . glipiZIDE (GLUCOTROL XL) 5 MG 24 hr tablet Take 5 mg by mouth daily with breakfast.    . Melatonin 12 MG TBDP Take 12 mg by mouth at bedtime.    . metFORMIN (GLUCOPHAGE-XR) 500 MG 24 hr tablet Take 500 mg by mouth 3 (three) times daily.    . montelukast (SINGULAIR) 10 MG tablet TAKE 1 TABLET BY MOUTH EVERY DAY (Patient taking differently: Take 10 mg by mouth daily. ) 30 tablet 3  . nystatin (MYCOSTATIN) 100000 UNIT/ML suspension Swish and swallow 89mL's by mouth three times daily for 10 days 160 mL 0  . olmesartan-hydrochlorothiazide (BENICAR HCT) 20-12.5 MG tablet Take 1 tablet by mouth daily. 30 tablet 5  . omeprazole (PRILOSEC) 40 MG  capsule TAKE 1 CAPSULE BY MOUTH EVERY DAY (Patient taking differently: Take 40 mg by mouth daily. ) 30 capsule 11  . sitaGLIPtin (JANUVIA) 100 MG tablet Take 100 mg by mouth daily.    Marland Kitchen atenolol (TENORMIN) 50 MG tablet Take 1 tablet (50 mg total) by mouth daily. 90 tablet 3   No current facility-administered medications for this visit.    Physical Exam BP 123/64 (BP Location: Left Arm, Patient Position: Sitting, Cuff Size: Small)   Pulse 72   Temp 97.9 F (36.6 C)   Resp 18   Ht 5\' 3"  (1.6 m)   Wt 114 lb 3.2 oz (51.8 kg)   SpO2 98% Comment: RA  BMI 20.40 kg/m  74 year old woman in no acute distress Alert and oriented x3 with no focal deficits Lungs diminished at right base but otherwise clear Incisions well-healed.  No chest wall deformity.  Diagnostic Tests: She had a chest x-ray at [facility] last week.  Impression: Holly Harper is a 74 year old woman who had a thoracoscopic right upper lobectomy for stage Ia non-small cell carcinoma in November 2020.  She is now 6 months out from surgery.  She has no evidence of recurrent disease.  She continues to have some pain in the right shoulder, axilla and arm region.  That has improved significantly but has not completely resolved.  She recently started physical therapy and that seems to be helping.  She remains on gabapentin, and I think she should stay on that until all of the neuropathic pain is resolved.  She is following with Dr. Bobby Rumpf in [place].  I will plan to see her back in 6 months for 1 year follow-up  Plan: Return in 6 months with PA lateral chest x-ray  I spent 10 minutes in review of records and meeting with Holly Harper this morning. Melrose Nakayama, MD [facility] [phone]

## 2019-11-13 ENCOUNTER — Telehealth: Payer: Self-pay | Admitting: Gastroenterology

## 2019-11-13 NOTE — Telephone Encounter (Signed)
The pt had a few concerns about her procedure tomorrow including which arm to take her BP.  I advised her to let the nurses know when she arrives at the hospital any concerns she has.  The pt has been advised of the information and verbalized understanding.

## 2019-11-13 NOTE — Telephone Encounter (Signed)
Left message on machine to call back  

## 2019-11-14 ENCOUNTER — Ambulatory Visit (HOSPITAL_COMMUNITY): Payer: Medicare Other | Admitting: Anesthesiology

## 2019-11-14 ENCOUNTER — Other Ambulatory Visit: Payer: Self-pay

## 2019-11-14 ENCOUNTER — Ambulatory Visit (HOSPITAL_COMMUNITY)
Admission: RE | Admit: 2019-11-14 | Discharge: 2019-11-14 | Disposition: A | Discharge status: Home / Self Care | Payer: Medicare Other | Attending: Gastroenterology | Admitting: Gastroenterology

## 2019-11-14 ENCOUNTER — Encounter (HOSPITAL_COMMUNITY): Payer: Self-pay | Admitting: Gastroenterology

## 2019-11-14 ENCOUNTER — Encounter (HOSPITAL_COMMUNITY)
Admission: RE | Disposition: A | Discharge status: Home / Self Care | Payer: Self-pay | Source: Home / Self Care | Attending: Gastroenterology

## 2019-11-14 ENCOUNTER — Telehealth: Payer: Self-pay

## 2019-11-14 DIAGNOSIS — C250 Malignant neoplasm of head of pancreas: Secondary | ICD-10-CM | POA: Insufficient documentation

## 2019-11-14 DIAGNOSIS — E7800 Pure hypercholesterolemia, unspecified: Secondary | ICD-10-CM | POA: Diagnosis not present

## 2019-11-14 DIAGNOSIS — Z79899 Other long term (current) drug therapy: Secondary | ICD-10-CM | POA: Insufficient documentation

## 2019-11-14 DIAGNOSIS — J45909 Unspecified asthma, uncomplicated: Secondary | ICD-10-CM | POA: Insufficient documentation

## 2019-11-14 DIAGNOSIS — I10 Essential (primary) hypertension: Secondary | ICD-10-CM | POA: Diagnosis not present

## 2019-11-14 DIAGNOSIS — E119 Type 2 diabetes mellitus without complications: Secondary | ICD-10-CM | POA: Insufficient documentation

## 2019-11-14 DIAGNOSIS — R948 Abnormal results of function studies of other organs and systems: Secondary | ICD-10-CM

## 2019-11-14 DIAGNOSIS — Z7982 Long term (current) use of aspirin: Secondary | ICD-10-CM | POA: Insufficient documentation

## 2019-11-14 DIAGNOSIS — K219 Gastro-esophageal reflux disease without esophagitis: Secondary | ICD-10-CM | POA: Diagnosis not present

## 2019-11-14 DIAGNOSIS — Z7984 Long term (current) use of oral hypoglycemic drugs: Secondary | ICD-10-CM | POA: Insufficient documentation

## 2019-11-14 DIAGNOSIS — K8689 Other specified diseases of pancreas: Secondary | ICD-10-CM | POA: Diagnosis not present

## 2019-11-14 DIAGNOSIS — Z85118 Personal history of other malignant neoplasm of bronchus and lung: Secondary | ICD-10-CM | POA: Insufficient documentation

## 2019-11-14 HISTORY — PX: FINE NEEDLE ASPIRATION: SHX5430

## 2019-11-14 HISTORY — PX: ESOPHAGOGASTRODUODENOSCOPY (EGD) WITH PROPOFOL: SHX5813

## 2019-11-14 HISTORY — PX: EUS: SHX5427

## 2019-11-14 LAB — GLUCOSE, CAPILLARY: Glucose-Capillary: 107 mg/dL — ABNORMAL HIGH (ref 70–99)

## 2019-11-14 SURGERY — UPPER ENDOSCOPIC ULTRASOUND (EUS) RADIAL
Anesthesia: Monitor Anesthesia Care

## 2019-11-14 MED ORDER — PROPOFOL 500 MG/50ML IV EMUL
INTRAVENOUS | Status: AC
  Filled 2019-11-14: qty 50

## 2019-11-14 MED ORDER — SODIUM CHLORIDE 0.9 % IV SOLN: INTRAVENOUS | Status: DC

## 2019-11-14 MED ORDER — PROPOFOL 10 MG/ML IV BOLUS
INTRAVENOUS | Status: AC
  Filled 2019-11-14: qty 20

## 2019-11-14 MED ORDER — LACTATED RINGERS IV SOLN: INTRAVENOUS | Status: DC

## 2019-11-14 MED ORDER — PROPOFOL 500 MG/50ML IV EMUL
INTRAVENOUS | Status: DC | PRN
  Administered 2019-11-14: 100 ug/kg/min via INTRAVENOUS

## 2019-11-14 MED ORDER — LIDOCAINE 2% (20 MG/ML) 5 ML SYRINGE
INTRAMUSCULAR | Status: DC | PRN
  Administered 2019-11-14: 60 mg via INTRAVENOUS

## 2019-11-14 MED ORDER — PROPOFOL 10 MG/ML IV BOLUS
INTRAVENOUS | Status: DC | PRN
  Administered 2019-11-14: 50 mg via INTRAVENOUS

## 2019-11-14 SURGICAL SUPPLY — 15 items

## 2019-11-14 NOTE — Telephone Encounter (Signed)
-----   Message from [name], MD sent at 11/14/2019 10:29 AM EDT ----- Holly Harper, Can you send a copy of today's EUS to Dr. Lavera Harper, oncology ([facility]?)   Holly Harper, I just completed EUS evaluation of the lesion in her pancreas.  Probably a second primary. I'll let you know.  Holly Harper    - 77mm mass in the head of the pancreas causing obstruction and dilation of the main pancreatic duct but not involving any nearby significant blood vessels or the extrahepatic bile ducts. The mass was sampled with EUS FNB, preliminary findings show neoplastic cells.  Await final cytology results.

## 2019-11-14 NOTE — Anesthesia Postprocedure Evaluation (Signed)
Anesthesia Post Note  Patient: Holly Harper  Procedure(s) Performed: UPPER ENDOSCOPIC ULTRASOUND (EUS) RADIAL (N/A ) ESOPHAGOGASTRODUODENOSCOPY (EGD) WITH PROPOFOL (N/A ) FINE NEEDLE ASPIRATION (FNA) LINEAR (N/A )     Patient location during evaluation: PACU Anesthesia Type: MAC Level of consciousness: awake and alert and oriented Pain management: pain level controlled Vital Signs Assessment: post-procedure vital signs reviewed and stable Respiratory status: spontaneous breathing, nonlabored ventilation and respiratory function stable Cardiovascular status: stable and blood pressure returned to baseline Postop Assessment: no apparent nausea or vomiting Anesthetic complications: no    Last Vitals:  Vitals:   11/14/19 0837 11/14/19 1029  BP: 140/70 (!) 107/37  Pulse: 84 (!) 59  Resp: 17 13  Temp: 37 C 36.7 C  SpO2: 98% 100%    Last Pain:  Vitals:   11/14/19 1029  TempSrc: Axillary  PainSc: Asleep                 Holly Harper A.

## 2019-11-14 NOTE — Telephone Encounter (Signed)
EUS report sent to Dr Bobby Rumpf.

## 2019-11-14 NOTE — Interval H&P Note (Signed)
History and Physical Interval Note:  11/14/2019 8:58 AM  Holly Harper  has presented today for surgery, with the diagnosis of abnormal pancreas.  The various methods of treatment have been discussed with the patient and family. After consideration of risks, benefits and other options for treatment, the patient has consented to  Procedure(s): UPPER ENDOSCOPIC ULTRASOUND (EUS) RADIAL (N/A) ESOPHAGOGASTRODUODENOSCOPY (EGD) WITH PROPOFOL (N/A) as a surgical intervention.  The patient's history has been reviewed, patient examined, no change in status, stable for surgery.  I have reviewed the patient's chart and labs.  Questions were answered to the patient's satisfaction.     [name]

## 2019-11-14 NOTE — Discharge Instructions (Signed)
YOU HAD AN ENDOSCOPIC PROCEDURE TODAY: Refer to the procedure report and other information in the discharge instructions given to you for any specific questions about what was found during the examination. If this information does not answer your questions, please call [place] office at [phone] to clarify.   YOU SHOULD EXPECT: Some feelings of bloating in the abdomen. Passage of more gas than usual. Walking can help get rid of the air that was put into your GI tract during the procedure and reduce the bloating. If you had a lower endoscopy (such as a colonoscopy or flexible sigmoidoscopy) you may notice spotting of blood in your stool or on the toilet paper. Some abdominal soreness may be present for a day or two, also.  DIET: Your first meal following the procedure should be a light meal and then it is ok to progress to your normal diet. A half-sandwich or bowl of soup is an example of a good first meal. Heavy or fried foods are harder to digest and may make you feel nauseous or bloated. Drink plenty of fluids but you should avoid alcoholic beverages for 24 hours. If you had a esophageal dilation, please see attached instructions for diet.    ACTIVITY: Your care partner should take you home directly after the procedure. You should plan to take it easy, moving slowly for the rest of the day. You can resume normal activity the day after the procedure however YOU SHOULD NOT DRIVE, use power tools, machinery or perform tasks that involve climbing or major physical exertion for 24 hours (because of the sedation medicines used during the test).   SYMPTOMS TO REPORT IMMEDIATELY: A gastroenterologist can be reached at any hour. Please call [phone]  for any of the following symptoms:   Following upper endoscopy (EGD, EUS, ERCP, esophageal dilation) Vomiting of blood or coffee ground material  New, significant abdominal pain  New, significant chest pain or pain under the shoulder blades  Painful or  persistently difficult swallowing  New shortness of breath  Black, tarry-looking or red, bloody stools  FOLLOW UP:  If any biopsies were taken you will be contacted by phone or by letter within the next 1-3 weeks. Call [phone]  if you have not heard about the biopsies in 3 weeks.  Please also call with any specific questions about appointments or follow up tests.  

## 2019-11-14 NOTE — Transfer of Care (Signed)
Immediate Anesthesia Transfer of Care Note  Patient: Holly Harper  Procedure(s) Performed: Procedure(s): UPPER ENDOSCOPIC ULTRASOUND (EUS) RADIAL (N/A) ESOPHAGOGASTRODUODENOSCOPY (EGD) WITH PROPOFOL (N/A) FINE NEEDLE ASPIRATION (FNA) LINEAR (N/A)  Patient Location: PACU  Anesthesia Type:MAC  Level of Consciousness:  sedated, patient cooperative and responds to stimulation  Airway & Oxygen Therapy:Patient Spontanous Breathing and Patient connected to face mask oxgen  Post-op Assessment:  Report given to PACU RN and Post -op Vital signs reviewed and stable  Post vital signs:  Reviewed and stable  Last Vitals:  Vitals:   11/14/19 0837 11/14/19 1029  BP: 140/70 (!) 107/37  Pulse: 84 (!) 59  Resp: 17 13  Temp: 37 C   SpO2: 38% 887%    Complications: No apparent anesthesia complications

## 2019-11-14 NOTE — Anesthesia Preprocedure Evaluation (Signed)
Anesthesia Evaluation  Patient identified by MRN, date of birth, ID band Patient awake    Reviewed: Allergy & Precautions, NPO status , Patient's Chart, lab work & pertinent test results  History of Anesthesia Complications (+) Family history of anesthesia reaction  Airway Mallampati: II  TM Distance: >3 FB Neck ROM: Full    Dental no notable dental hx. (+) Teeth Intact   Pulmonary asthma ,    Pulmonary exam normal breath sounds clear to auscultation       Cardiovascular hypertension, Pt. on medications Normal cardiovascular exam Rhythm:Regular Rate:Normal     Neuro/Psych Anxiety negative neurological ROS     GI/Hepatic Neg liver ROS, hiatal hernia, GERD  Medicated and Controlled,Abnormal pancreas   Endo/Other  diabetes, Well Controlled, Type 2, Oral Hypoglycemic Agents  Renal/GU negative Renal ROS  negative genitourinary   Musculoskeletal negative musculoskeletal ROS (+)   Abdominal   Peds  Hematology  (+) anemia ,   Anesthesia Other Findings   Reproductive/Obstetrics                             Anesthesia Physical Anesthesia Plan  ASA: III  Anesthesia Plan: MAC   Post-op Pain Management:    Induction: Intravenous  PONV Risk Score and Plan: 3 and Ondansetron and Treatment may vary due to age or medical condition  Airway Management Planned: Natural Airway and Nasal Cannula  Additional Equipment:   Intra-op Plan:   Post-operative Plan:   Informed Consent: I have reviewed the patients History and Physical, chart, labs and discussed the procedure including the risks, benefits and alternatives for the proposed anesthesia with the patient or authorized representative who has indicated his/her understanding and acceptance.     Dental advisory given  Plan Discussed with: CRNA and Surgeon  Anesthesia Plan Comments:         Anesthesia Quick Evaluation

## 2019-11-14 NOTE — Op Note (Signed)
[facility] Patient Name: Holly Harper Procedure Date: 11/14/2019 MRN: [id number] Attending MD: [name] , MD Date of Birth: 1945/07/13 CSN: [id number] Age: 74 Admit Type: Outpatient Procedure:                Upper EUS Indications:              Early stage [place] Lung Ca resected last year; CT                            02/2019 suggested dilated main PD. PET scan 02/2019                            no pancreatic PET uptake. CT 09/2019 dilated main                            pancreatic duct, possible small mass at ampullary                            end of the duct Providers:                [name], MD, Josie Dixon, RN, Wynonia Sours, RN, Theodora Blow, Technician Referring MD:             Lavera Guise, MD Medicines:                Monitored Anesthesia Care Complications:            No immediate complications. Estimated blood loss:                            None. Estimated Blood Loss:     Estimated blood loss: none. Procedure:                Pre-Anesthesia Assessment:                           - Prior to the procedure, a History and Physical                            was performed, and patient medications and                            allergies were reviewed. The patient's tolerance of                            previous anesthesia was also reviewed. The risks                            and benefits of the procedure and the sedation                            options and risks were discussed with the patient.  All questions were answered, and informed consent                            was obtained. Prior Anticoagulants: The patient has                            taken no previous anticoagulant or antiplatelet                            agents. ASA Grade Assessment: II - A patient with                            mild systemic disease. After reviewing the risks                            and  benefits, the patient was deemed in                            satisfactory condition to undergo the procedure.                           After obtaining informed consent, the endoscope was                            passed under direct vision. Throughout the                            procedure, the patient's blood pressure, pulse, and                            oxygen saturations were monitored continuously. The                            [id number]-[id number] ([id number]) [place] was                            introduced through the mouth, and advanced to the                            second part of duodenum. The GF-UTC180 ([id number])                            Olympus linear EUS was introduced through the                            mouth, and advanced to the second part of duodenum.                            The GF-[id number] ([id number]) Olympus Linear EUS was                            introduced through the mouth, and advanced to the  second part of duodenum. The upper EUS was                            accomplished without difficulty. The patient                            tolerated the procedure well. Scope In: Scope Out: Findings:      ENDOSCOPIC FINDING: :      The examined esophagus was endoscopically normal.      The entire examined stomach was endoscopically normal.      The examined duodenum was endoscopically normal.      ENDOSONOGRAPHIC FINDING (with radial and linear echoendoscopes): :      1. The pancreatic parenchyma in the neck, body and tail was very       atrophic. A round mass was identified in the pancreatic head at the very       proximal end of the main pancreatic duct (near major papilla). This was       only apparent with the linear echoendoscope as the radial did not allow       adequate views. The mass was hypoechoic and heterogenous. The mass       measured 11 mm in maximal cross-sectional diameter. The endosonographic       borders were  poorly-defined. The mass is associated with diffuse       dilation of the main pancreatic duct (6-91mm in body). The mass does not       abut any nearby signficant vessels. Fine needle aspiration for cytology       was performed. Color Doppler imaging was utilized prior to needle       puncture to confirm a lack of significant vascular structures within the       needle path. Three passes were made with the 25 gauge needle using a       transduodenal approach. Final cytology results are pending.      2. No peripancreatic adenopathy.      3. CBD was normal, non-dilated.      4. Gallbladder was normal.      5. Limited views of the liver, spleen, portal and splenic vessels were       all normal. Impression:               - 43mm mass in the head of the pancreas causing                            obstruction and dilation of the main pancreatic                            duct but not involving any nearby significant blood                            vessels or the extrahepatic bile ducts. The mass                            was sampled with EUS FNB, preliminary findings show                            neoplastic cells. Await final cytology results.  Moderate Sedation:      Not Applicable - Patient had care per Anesthesia. Recommendation:           - Discharge patient to home. Procedure Code(s):        --- Professional ---                           [phone], Esophagogastroduodenoscopy, flexible,                            transoral; with transendoscopic ultrasound-guided                            intramural or transmural fine needle                            aspiration/biopsy(s), (includes endoscopic                            ultrasound examination limited to the esophagus,                            stomach or duodenum, and adjacent structures) Diagnosis Code(s):        --- Professional ---                           K86.89, Other specified diseases of pancreas                           R93.3,  Abnormal findings on diagnostic imaging of                            other parts of digestive tract CPT copyright 2019 [employer]. All rights reserved. The codes documented in this report are preliminary and upon coder review may  be revised to meet current compliance requirements. [name], MD 11/14/2019 10:28:50 AM This report has been signed electronically. Number of Addenda: 0

## 2019-11-15 LAB — CYTOLOGY - NON PAP

## 2019-11-19 ENCOUNTER — Other Ambulatory Visit: Payer: Self-pay

## 2019-11-19 ENCOUNTER — Ambulatory Visit: Payer: Medicare Other | Attending: Oncology | Admitting: Physical Therapy

## 2019-11-19 ENCOUNTER — Encounter: Payer: Self-pay | Admitting: *Deleted

## 2019-11-19 DIAGNOSIS — M25511 Pain in right shoulder: Secondary | ICD-10-CM | POA: Insufficient documentation

## 2019-11-19 DIAGNOSIS — R911 Solitary pulmonary nodule: Secondary | ICD-10-CM | POA: Insufficient documentation

## 2019-11-19 DIAGNOSIS — R293 Abnormal posture: Secondary | ICD-10-CM | POA: Diagnosis present

## 2019-11-19 DIAGNOSIS — I890 Lymphedema, not elsewhere classified: Secondary | ICD-10-CM | POA: Diagnosis not present

## 2019-11-19 DIAGNOSIS — M25611 Stiffness of right shoulder, not elsewhere classified: Secondary | ICD-10-CM | POA: Insufficient documentation

## 2019-11-19 DIAGNOSIS — G8929 Other chronic pain: Secondary | ICD-10-CM

## 2019-11-19 NOTE — Patient Instructions (Signed)
Self manual lymph drainage: Perform this sequence once a day.  Only give enough pressure no your skin to make the skin move.  Diaphragmatic - Supine   Inhale through nose making navel move out toward hands. Exhale through puckered lips, hands follow navel in. Repeat _5__ times. Rest _10__ seconds between repeats.   Copyright  VHI. All rights reserved.  Hug yourself.  Do circles at your neck just above your collarbones.  Repeat this 10 times.  Axilla - One at a Time   Using full weight of flat hand and fingers at center of uninvolved armpit, make _10__ in-place circles.   Copyright  VHI. All rights reserved.  LEG: Inguinal Nodes Stimulation   With small finger side of hand against hip crease on involved side, gently perform circles at the crease. Repeat __10_ times.   Copyright  VHI. All rights reserved.  1) Axilla to Inguinal Nodes - Sweep   On involved side, sweep _4__ times from armpit along side of trunk to hip crease.  Now gently stretch skin from the involved side to the uninvolved side across the chest at the shoulder line.  Repeat that 4 times.  Draw an imaginary diagonal line from upper outer breast through the nipple area toward lower inner breast.  Direct fluid upward and inward from this line toward the pathway across your upper chest .  Do this in three rows to treat all of the upper inner breast tissue, and do each row 3-4x.      Direct fluid to treat all of lower outer breast tissue downward and outward toward pathway that is aimed at the right groin.  Finish by doing the pathways as described above going from your involved armpit to the same side groin and going across your upper chest from the involved shoulder to the uninvolved shoulder.  Repeat the steps above where you do circles in your right groin and left armpit. Copyright  VHI. All rights reserved.

## 2019-11-19 NOTE — Therapy (Signed)
[place], [place], [address] Phone: [phone]   Fax:  [phone]  Physical Therapy Treatment  Patient Details  Name: Holly Harper MRN: [id number] Date of Birth: 1945-07-23 Referring Provider (PT): Lavera Guise MD   Encounter Date: 11/19/2019  PT End of Session - 11/19/19 1401    Visit Number  2    Number of Visits  9    Date for PT Re-Evaluation  12/19/19    PT Start Time  1302    PT Stop Time  1358    PT Time Calculation (min)  56 min    Activity Tolerance  Patient tolerated treatment well    Behavior During Therapy  [facility] for tasks assessed/performed       Past Medical History:  Diagnosis Date  . Allergic rhinitis   . Anxiety   . Asthma   . Diabetes ([place])   . Dysphagia   . Family history of adverse reaction to anesthesia    sister had PONV  . GERD (gastroesophageal reflux disease)   . Hiatal hernia   . Hypercholesterolemia   . Hypertension   . Lung cancer ([place]) 2020  . Paresthesia     Past Surgical History:  Procedure Laterality Date  . COLONOSCOPY  02/10/2016   Mild pancolonic diverticulosis. Otherwise, normal colonoscopy  . ESOPHAGOGASTRODUODENOSCOPY  08/24/2011   Prebyesophagus. Mininal hiatal hernia. Mild gastritis  . ESOPHAGOGASTRODUODENOSCOPY (EGD) WITH PROPOFOL N/A 11/14/2019   Procedure: ESOPHAGOGASTRODUODENOSCOPY (EGD) WITH PROPOFOL;  Surgeon: [name], MD;  Location: [facility];  Service: Endoscopy;  Laterality: N/A;  . EUS N/A 11/14/2019   Procedure: UPPER ENDOSCOPIC ULTRASOUND (EUS) RADIAL;  Surgeon: [name], MD;  Location: [facility];  Service: Endoscopy;  Laterality: N/A;  . FINE NEEDLE ASPIRATION N/A 11/14/2019   Procedure: FINE NEEDLE ASPIRATION (FNA) LINEAR;  Surgeon: [name], MD;  Location: [facility];  Service: Endoscopy;  Laterality: N/A;  . INTERCOSTAL NERVE BLOCK Right 05/09/2019   Procedure: Intercostal Nerve Block  thoracic;  Surgeon: Melrose Nakayama, MD;  Location: [place];  Service: Thoracic;  Laterality: Right;  . LASIK Bilateral   . LIPOMA RESECTION    . LOBECTOMY Right 05/09/2019   Procedure: LOBECTOMY;  Surgeon: Melrose Nakayama, MD;  Location: [place];  Service: Thoracic;  Laterality: Right;  Marland Kitchen VIDEO ASSISTED THORACOSCOPY (VATS)/WEDGE RESECTION Right 05/09/2019   Procedure: VIDEO ASSISTED THORACOSCOPY (VATS)/WEDGE RESECTION FOR FROZEN SECTIONS ;  Surgeon: Melrose Nakayama, MD;  Location: [place];  Service: Thoracic;  Laterality: Right;    There were no vitals filed for this visit.  Subjective Assessment - 11/19/19 1308    Subjective  The tape really helped. I just found out that I have cancer of my pancreas. It is unrelated to my lungs.    Pertinent History  thoracoscopic right upper lobectomy for a T1c, N0, stage Ia adenocarcinoma on 05/09/2019.    Patient Stated Goals  I want to decrease my pain and I want to be able to wear a bra.    Currently in Pain?  No/denies    Pain Score  0-No pain                        [facility] Adult PT Treatment/Exercise - 11/19/19 0001      Manual Therapy   Manual Therapy  Manual Lymphatic Drainage (MLD);Soft tissue mobilization;Edema management    Edema Management  Edema taping with 2 fanned I  strips over the lateral R breast toward posterior anastomosis and axillo-inguinal anastomosis     Soft tissue mobilization  to area of serratus and lats on right lateral side and gently along intercostal at lateral breast where pt has increased pain and sensitivity    Manual Lymphatic Drainage (MLD)  short neck, 5 diaphragmatic breaths, left axillary nodes and establishment of interaxillary pathways, right inguinal nodes and establishment of axillo inguinal pathway, right breast with focus on lateral breast moving fluid towards pathway and instructing pt throughout               PT Short Term Goals - 11/07/19 0917      PT SHORT TERM GOAL  #1   Title  Pt will be independent with HEP and self MLD within 2 weeks for autonomy of care.    Baseline  Pt was just provided with HEP and does not know how to perform MLD> 2    Time  2    Period  Weeks    Status  New    Target Date  11/28/19        PT Long Term Goals - 11/07/19 0917      PT LONG TERM GOAL #1   Title  Pt will improve R shoulder flexion to 140, abduction 120, interal rotation to 70 degrees within 4 weeks to demonstrate improve functional ROM.    Baseline  R shoulder flexion: 123, abduction: 97, internal rotation 50    Time  4    Period  Weeks    Status  New    Target Date  12/19/19      PT LONG TERM GOAL #2   Title  Pt will improve DASH score to less than 20% disability for the RUE after 4 weeks to demonstrate a subjective improvement in R shoulder function.    Baseline  47%    Time  4    Period  Weeks    Status  New    Target Date  12/19/19      PT LONG TERM GOAL #3   Title  Pt will report 50% improvement or greater in pain and function of the RUE after 4 weeks in order to demonstrate a subjective improvement in quality of life.    Baseline  2/10 pain and greater with prolonged activity    Time  4    Period  Weeks    Status  New    Target Date  12/19/19      PT LONG TERM GOAL #4   Title  Pt will demonstrate 2cm reduction in thoracic measurement to demonstrate decreased fluid in the posterior quadrants within 4 weeks.    Baseline  see measurements.    Time  4    Period  Weeks    Status  New    Target Date  12/19/19      PT LONG TERM GOAL #5   Title  Pt will be measured for and obtain appropriate compression garments to wear on a dialy basis to control edema at home.    Baseline  pt has no compression garments.    Time  4    Period  Weeks    Status  New    Target Date  12/19/19            Plan - 11/19/19 1402    Clinical Impression Statement  Began MLD to R lateral breast and began instructing pt in correct technique and sequence. Issued  handout  and will have pt return demonstrate at next session. Continued with kinesiotape since pt found this helpful last session. Began STM along serratus on R to help decrease pain and tenderness and along intercostals. Pt would benefit from continued skilled PT services to progress towards goals and decrease pain and swelling.    PT Frequency  2x / week    PT Duration  4 weeks    PT Treatment/Interventions  Therapeutic activities;Therapeutic exercise;Neuromuscular re-education;Moist Heat;Iontophoresis 4mg /ml Dexamethasone;Manual techniques    PT Next Visit Plan  continue MLD have pt return demonstrate, assess taping, (teach taping if appropriate) P/ROM, assess exercises, easy myofascial release.    PT Home Exercise Plan  post op exercises    Consulted and Agree with Plan of Care  Patient       Patient will benefit from skilled therapeutic intervention in order to improve the following deficits and impairments:  Increased edema, Increased fascial restricitons, Decreased range of motion, Postural dysfunction, Pain  Visit Diagnosis: Lymphedema, not elsewhere classified  Stiffness of right shoulder, not elsewhere classified  Chronic right shoulder pain     Problem List Patient Active Problem List   Diagnosis Date Noted  . S/P thoracotomy 05/09/2019  . Solitary pulmonary nodule on lung CT 04/15/2019  . Upper airway cough syndrome 04/15/2019  . Mixed hyperlipidemia 02/12/2019  . Type 2 diabetes mellitus without complication, without long-term current use of insulin ([place]) 02/12/2019  . Palpitations 02/12/2019  . Subungual hematoma of toenail of left foot 06/21/2016  . Diabetes mellitus without complication ([place]) 61/95/0932  . DIABETES MELLITUS, TYPE II 11/03/2007  . Essential hypertension 11/03/2007  . ALLERGIC RHINITIS 11/03/2007  . ESOPHAGEAL REFLUX 11/03/2007    Allyson Sabal [facility] 11/19/2019, 2:04 PM  [place], [place], [address] Phone: [phone]   Fax:  [phone]  Name: Holly Harper MRN: [id number] Date of Birth: Jul 20, 1945   Manus Gunning, PT 11/19/19 2:04 PM

## 2019-11-21 DIAGNOSIS — C250 Malignant neoplasm of head of pancreas: Secondary | ICD-10-CM

## 2019-11-21 DIAGNOSIS — C3411 Malignant neoplasm of upper lobe, right bronchus or lung: Secondary | ICD-10-CM

## 2019-11-22 ENCOUNTER — Other Ambulatory Visit: Payer: Self-pay

## 2019-11-22 ENCOUNTER — Ambulatory Visit: Payer: Medicare Other

## 2019-11-22 DIAGNOSIS — R911 Solitary pulmonary nodule: Secondary | ICD-10-CM

## 2019-11-22 DIAGNOSIS — G8929 Other chronic pain: Secondary | ICD-10-CM

## 2019-11-22 DIAGNOSIS — M25511 Pain in right shoulder: Secondary | ICD-10-CM

## 2019-11-22 DIAGNOSIS — R293 Abnormal posture: Secondary | ICD-10-CM

## 2019-11-22 DIAGNOSIS — M25611 Stiffness of right shoulder, not elsewhere classified: Secondary | ICD-10-CM

## 2019-11-22 DIAGNOSIS — I890 Lymphedema, not elsewhere classified: Secondary | ICD-10-CM

## 2019-11-22 NOTE — Therapy (Signed)
[place], [place], [address] Phone: [phone]   Fax:  [phone]  Physical Therapy Treatment  Patient Details  Name: Holly Harper MRN: [id number] Date of Birth: 01/31/1946 Referring Provider (PT): Lavera Guise MD   Encounter Date: 11/22/2019  PT End of Session - 11/22/19 1101    Visit Number  3    Number of Visits  9    Date for PT Re-Evaluation  12/19/19    PT Start Time  1100    PT Stop Time  1155    PT Time Calculation (min)  55 min    Activity Tolerance  Patient tolerated treatment well    Behavior During Therapy  [facility] for tasks assessed/performed       Past Medical History:  Diagnosis Date  . Allergic rhinitis   . Anxiety   . Asthma   . Diabetes ([place])   . Dysphagia   . Family history of adverse reaction to anesthesia    sister had PONV  . GERD (gastroesophageal reflux disease)   . Hiatal hernia   . Hypercholesterolemia   . Hypertension   . Lung cancer ([place]) 2020  . Paresthesia     Past Surgical History:  Procedure Laterality Date  . COLONOSCOPY  02/10/2016   Mild pancolonic diverticulosis. Otherwise, normal colonoscopy  . ESOPHAGOGASTRODUODENOSCOPY  08/24/2011   Prebyesophagus. Mininal hiatal hernia. Mild gastritis  . ESOPHAGOGASTRODUODENOSCOPY (EGD) WITH PROPOFOL N/A 11/14/2019   Procedure: ESOPHAGOGASTRODUODENOSCOPY (EGD) WITH PROPOFOL;  Surgeon: [name], MD;  [facility];  Service: Endoscopy;  Laterality: N/A;  . EUS N/A 11/14/2019   Procedure: UPPER ENDOSCOPIC ULTRASOUND (EUS) RADIAL;  Surgeon: [name], MD;  [facility];  Service: Endoscopy;  Laterality: N/A;  . FINE NEEDLE ASPIRATION N/A 11/14/2019   Procedure: FINE NEEDLE ASPIRATION (FNA) LINEAR;  Surgeon: [name], MD;  [facility];  Service: Endoscopy;  Laterality: N/A;  . INTERCOSTAL NERVE BLOCK Right 05/09/2019   Procedure: Intercostal Nerve Block  thoracic;  Surgeon: Melrose Nakayama, MD;  Location: [place];  Service: Thoracic;  Laterality: Right;  . LASIK Bilateral   . LIPOMA RESECTION    . LOBECTOMY Right 05/09/2019   Procedure: LOBECTOMY;  Surgeon: Melrose Nakayama, MD;  Location: [place];  Service: Thoracic;  Laterality: Right;  Marland Kitchen VIDEO ASSISTED THORACOSCOPY (VATS)/WEDGE RESECTION Right 05/09/2019   Procedure: VIDEO ASSISTED THORACOSCOPY (VATS)/WEDGE RESECTION FOR FROZEN SECTIONS ;  Surgeon: Melrose Nakayama, MD;  Location: [place];  Service: Thoracic;  Laterality: Right;    There were no vitals filed for this visit.  Subjective Assessment - 11/22/19 1102    Subjective  Pt states that she is ready to get better becuase she has to have another cancer surgery. She states that she has a little pain in her R axilla and medial brachium.    Pertinent History  thoracoscopic right upper lobectomy for a T1c, N0, stage Ia adenocarcinoma on 05/09/2019.    Patient Stated Goals  I want to decrease my pain and I want to be able to wear a bra.    Currently in Pain?  Yes    Pain Score  3     Pain Location  Axilla    Pain Orientation  Right;Medial    Pain Descriptors / Indicators  Burning;Numbness    Pain Type  Surgical pain;Chronic pain    Pain Onset  More than a month ago    Pain Frequency  Intermittent  Aggravating Factors   using the arm throughout the day    Pain Relieving Factors  rest and taping.                        [facility] Adult PT Treatment/Exercise - 11/22/19 0001      Manual Therapy   Manual Therapy  Manual Lymphatic Drainage (MLD);Soft tissue mobilization;Edema management;Myofascial release;Passive ROM    Edema Management  edema taping talking pt through how to perform herself. 2 I strips fanned from the R axilla to the R lateral breast then from the mid lateral trunk toward the inguinal nodes over the R lateral breast. Pt was provided with 1/2 inch gray foam for the R lateral breast and under the R  axilla to see if this doesn't help with sensitivity.     Soft tissue mobilization  very easy STM over the R pectorlis major, deltoid, biceps due to tightness noted with minimal decrease by end of session.     Myofascial Release  easy myofascial release along the anterior chest wall and in the R axilla due to tightness noted in the fascial layers.     Manual Lymphatic Drainage (MLD)  In supine: short neck, swimming in the terminus, bil axillary and R inguinal nodes, anterior inter-axillary anastomosis, R axillo-inguinal anastomosis, superior/inferior breast toward corresponding anastomosis before and re-worked after myofascial release.     Passive ROM  into flexion/abduction with easy stretching at pt end range with not reports of impingment.              PT Education - 11/22/19 1234    Education Details  Pt was educated to try the 1/2 inch gray foam over the R lateral breast and talked through the edema taping.    Person(s) Educated  Patient    Methods  Explanation;Demonstration    Comprehension  Verbalized understanding       PT Short Term Goals - 11/07/19 0917      PT SHORT TERM GOAL #1   Title  Pt will be independent with HEP and self MLD within 2 weeks for autonomy of care.    Baseline  Pt was just provided with HEP and does not know how to perform MLD> 2    Time  2    Period  Weeks    Status  New    Target Date  11/28/19        PT Long Term Goals - 11/07/19 0917      PT LONG TERM GOAL #1   Title  Pt will improve R shoulder flexion to 140, abduction 120, interal rotation to 70 degrees within 4 weeks to demonstrate improve functional ROM.    Baseline  R shoulder flexion: 123, abduction: 97, internal rotation 50    Time  4    Period  Weeks    Status  New    Target Date  12/19/19      PT LONG TERM GOAL #2   Title  Pt will improve DASH score to less than 20% disability for the RUE after 4 weeks to demonstrate a subjective improvement in R shoulder function.    Baseline   47%    Time  4    Period  Weeks    Status  New    Target Date  12/19/19      PT LONG TERM GOAL #3   Title  Pt will report 50% improvement or greater in pain and function of the  RUE after 4 weeks in order to demonstrate a subjective improvement in quality of life.    Baseline  2/10 pain and greater with prolonged activity    Time  4    Period  Weeks    Status  New    Target Date  12/19/19      PT LONG TERM GOAL #4   Title  Pt will demonstrate 2cm reduction in thoracic measurement to demonstrate decreased fluid in the posterior quadrants within 4 weeks.    Baseline  see measurements.    Time  4    Period  Weeks    Status  New    Target Date  12/19/19      PT LONG TERM GOAL #5   Title  Pt will be measured for and obtain appropriate compression garments to wear on a dialy basis to control edema at home.    Baseline  pt has no compression garments.    Time  4    Period  Weeks    Status  New    Target Date  12/19/19            Plan - 11/22/19 1101    Clinical Impression Statement  Pt continues with tenderness in her R breast. MLD was performed prior to and following STM and myofascial release over the R anterior/lateral trunk area and the R brachium. Pt demonstrate significant muscular tightness in the R brachium and anterior chest wall as well as tight fascial layers; minimal to moderate improvement by end of session. Pt was talked through how to perform edema taping for the R lateral breast and modified taping slightly so that she will be able to tape herself. 1/2 inch gray foam was provided to see if she can wear this in her bra and if it will provide any relief. Pt was able to tolerate increased P/ROM into flexoin/abduction following easy myofascial release without significant reports of increased pain. Pt will benefit from continued POC at this time.    Personal Factors and Comorbidities  Comorbidity 1    Comorbidities  R lobectomy with 14 lymph node removal on 05/09/2019     Rehab Potential  Good    PT Frequency  2x / week    PT Duration  4 weeks    PT Treatment/Interventions  Therapeutic activities;Therapeutic exercise;Neuromuscular re-education;Moist Heat;Iontophoresis 4mg /ml Dexamethasone;Manual techniques    PT Next Visit Plan  work on MLD more, assess taping, (teach taping if appropriate) P/ROM, assess exercises, easy myofascial release.    PT Home Exercise Plan  post op exercises    Consulted and Agree with Plan of Care  Patient       Patient will benefit from skilled therapeutic intervention in order to improve the following deficits and impairments:  Increased edema, Increased fascial restricitons, Decreased range of motion, Postural dysfunction, Pain  Visit Diagnosis: Lymphedema, not elsewhere classified  Stiffness of right shoulder, not elsewhere classified  Chronic right shoulder pain  Solitary pulmonary nodule on lung CT  Abnormal posture     Problem List Patient Active Problem List   Diagnosis Date Noted  . S/P thoracotomy 05/09/2019  . Solitary pulmonary nodule on lung CT 04/15/2019  . Upper airway cough syndrome 04/15/2019  . Mixed hyperlipidemia 02/12/2019  . Type 2 diabetes mellitus without complication, without long-term current use of insulin ([place]) 02/12/2019  . Palpitations 02/12/2019  . Subungual hematoma of toenail of left foot 06/21/2016  . Diabetes mellitus without complication ([place]) 01/74/9449  . DIABETES  MELLITUS, TYPE II 11/03/2007  . Essential hypertension 11/03/2007  . ALLERGIC RHINITIS 11/03/2007  . ESOPHAGEAL REFLUX 11/03/2007    Ander Purpura, PT 11/22/2019, 12:38 PM  [place], [place], [address] Phone: [phone]   Fax:  [phone]  Name: Holly Harper MRN: [id number] Date of Birth: [birth year]

## 2019-12-02 ENCOUNTER — Ambulatory Visit: Payer: Medicare Other

## 2019-12-02 ENCOUNTER — Other Ambulatory Visit: Payer: Self-pay

## 2019-12-02 DIAGNOSIS — R911 Solitary pulmonary nodule: Secondary | ICD-10-CM

## 2019-12-02 DIAGNOSIS — G8929 Other chronic pain: Secondary | ICD-10-CM

## 2019-12-02 DIAGNOSIS — I890 Lymphedema, not elsewhere classified: Secondary | ICD-10-CM | POA: Diagnosis not present

## 2019-12-02 DIAGNOSIS — M25611 Stiffness of right shoulder, not elsewhere classified: Secondary | ICD-10-CM

## 2019-12-02 DIAGNOSIS — R293 Abnormal posture: Secondary | ICD-10-CM

## 2019-12-02 NOTE — Patient Instructions (Signed)
Access Code: [email or web address] URL: [email or web address] Date: 12/02/2019 Prepared by: Tomma Rakers  Exercises Standing Bilateral Low Shoulder Row with Anchored Resistance - 1 x daily - 7 x weekly - 1 sets - 10 reps Doorway Pec Stretch at 60 Elevation - 1 x daily - 7 x weekly - 1 sets - 2 reps - 20 seconds hold

## 2019-12-02 NOTE — Therapy (Signed)
[place], [place], [address] Phone: [phone]   Fax:  [phone]  Physical Therapy Treatment  Patient Details  Name: Holly Harper MRN: [id number] Date of Birth: 07-Nov-1945 Referring Provider (PT): Lavera Guise MD   Encounter Date: 12/02/2019   PT End of Session - 12/02/19 1510    Visit Number 4    Number of Visits 9    Date for PT Re-Evaluation 12/19/19    PT Start Time 9379    PT Stop Time 1600    PT Time Calculation (min) 53 min    Activity Tolerance Patient tolerated treatment well    Behavior During Therapy [facility] for tasks assessed/performed           Past Medical History:  Diagnosis Date  . Allergic rhinitis   . Anxiety   . Asthma   . Diabetes ([place])   . Dysphagia   . Family history of adverse reaction to anesthesia    sister had PONV  . GERD (gastroesophageal reflux disease)   . Hiatal hernia   . Hypercholesterolemia   . Hypertension   . Lung cancer ([place]) 2020  . Paresthesia     Past Surgical History:  Procedure Laterality Date  . COLONOSCOPY  02/10/2016   Mild pancolonic diverticulosis. Otherwise, normal colonoscopy  . ESOPHAGOGASTRODUODENOSCOPY  08/24/2011   Prebyesophagus. Mininal hiatal hernia. Mild gastritis  . ESOPHAGOGASTRODUODENOSCOPY (EGD) WITH PROPOFOL N/A 11/14/2019   Procedure: ESOPHAGOGASTRODUODENOSCOPY (EGD) WITH PROPOFOL;  Surgeon: [name], MD;  [facility];  Service: Endoscopy;  Laterality: N/A;  . EUS N/A 11/14/2019   Procedure: UPPER ENDOSCOPIC ULTRASOUND (EUS) RADIAL;  Surgeon: [name], MD;  [facility];  Service: Endoscopy;  Laterality: N/A;  . FINE NEEDLE ASPIRATION N/A 11/14/2019   Procedure: FINE NEEDLE ASPIRATION (FNA) LINEAR;  Surgeon: [name], MD;  [facility];  Service: Endoscopy;  Laterality: N/A;  . INTERCOSTAL NERVE BLOCK Right 05/09/2019   Procedure: Intercostal Nerve Block  thoracic;  Surgeon: Melrose Nakayama, MD;  Location: [place];  Service: Thoracic;  Laterality: Right;  . LASIK Bilateral   . LIPOMA RESECTION    . LOBECTOMY Right 05/09/2019   Procedure: LOBECTOMY;  Surgeon: Melrose Nakayama, MD;  Location: [place];  Service: Thoracic;  Laterality: Right;  Marland Kitchen VIDEO ASSISTED THORACOSCOPY (VATS)/WEDGE RESECTION Right 05/09/2019   Procedure: VIDEO ASSISTED THORACOSCOPY (VATS)/WEDGE RESECTION FOR FROZEN SECTIONS ;  Surgeon: Melrose Nakayama, MD;  Location: [place];  Service: Thoracic;  Laterality: Right;    There were no vitals filed for this visit.   Subjective Assessment - 12/02/19 1510    Subjective Pt states that she met one of her goals she is able to wear a bra using the 1/2 inch gray foam. She states that her pain feels better but her tingling is still present.    Pertinent History thoracoscopic right upper lobectomy for a T1c, N0, stage Ia adenocarcinoma on 05/09/2019.    Patient Stated Goals I want to decrease my pain and I want to be able to wear a bra.    Currently in Pain? No/denies    Pain Score 3     Pain Location Arm    Pain Orientation Medial    Pain Descriptors / Indicators Tingling    Pain Type Surgical pain;Chronic pain    Pain Onset More than a month ago    Pain Frequency Intermittent    Aggravating Factors  tingling unknown  Pain Relieving Factors rest and taping                             [facility] Adult PT Treatment/Exercise - 12/02/19 0001      Exercises   Other Exercises  standing row yellow theraband 10x with tactile cueing, demonstration and VC for correct movement, VC to for slow eccentric control.       Manual Therapy   Manual Therapy Manual Lymphatic Drainage (MLD);Soft tissue mobilization;Myofascial release;Passive ROM    Soft tissue mobilization slightly deeper STM to the R pectoralis major with TpR due to 3x trigger points noted; pt reports decreased pain/stiffness in the R pectoralis following  STM and TpR.     Manual Lymphatic Drainage (MLD) In supine: short neck, swimming in the terminus, bil axillary and R inguinal nodes, anterior inter-axillary anastomosis, R axillo-inguinal anastomosis, superior/inferior breast toward corresponding anastomosis before and re-worked after myofascial release and STM.     Passive ROM into flexion/abduction with easy stretching at pt end range with no reports of impingment.                   PT Education - 12/02/19 1600    Education Details Access Code: [email or web address], 1/2 inch gray foam over the R lateral breast continue to wear. Pt will start with new HEP for pec stretching and upper back strengthening for antagonistic strengthening to counteract pec tightness.    Person(s) Educated Patient    Methods Explanation;Demonstration;Tactile cues;Verbal cues;Handout    Comprehension Verbalized understanding;Returned demonstration            PT Short Term Goals - 11/07/19 0917      PT SHORT TERM GOAL #1   Title Pt will be independent with HEP and self MLD within 2 weeks for autonomy of care.    Baseline Pt was just provided with HEP and does not know how to perform MLD> 2    Time 2    Period Weeks    Status New    Target Date 11/28/19             PT Long Term Goals - 11/07/19 0917      PT LONG TERM GOAL #1   Title Pt will improve R shoulder flexion to 140, abduction 120, interal rotation to 70 degrees within 4 weeks to demonstrate improve functional ROM.    Baseline R shoulder flexion: 123, abduction: 97, internal rotation 50    Time 4    Period Weeks    Status New    Target Date 12/19/19      PT LONG TERM GOAL #2   Title Pt will improve DASH score to less than 20% disability for the RUE after 4 weeks to demonstrate a subjective improvement in R shoulder function.    Baseline 47%    Time 4    Period Weeks    Status New    Target Date 12/19/19      PT LONG TERM GOAL #3   Title Pt will report 50% improvement or greater in pain  and function of the RUE after 4 weeks in order to demonstrate a subjective improvement in quality of life.    Baseline 2/10 pain and greater with prolonged activity    Time 4    Period Weeks    Status New    Target Date 12/19/19      PT LONG TERM GOAL #4   Title Pt will  demonstrate 2cm reduction in thoracic measurement to demonstrate decreased fluid in the posterior quadrants within 4 weeks.    Baseline see measurements.    Time 4    Period Weeks    Status New    Target Date 12/19/19      PT LONG TERM GOAL #5   Title Pt will be measured for and obtain appropriate compression garments to wear on a dialy basis to control edema at home.    Baseline pt has no compression garments.    Time 4    Period Weeks    Status New    Target Date 12/19/19                 Plan - 12/02/19 1510    Clinical Impression Statement Pt is reporting significant improvement in tenderness in her R breast with use of 1/2 inch gray foam; she was instructed to continue with use of foam and will not perform taping this session. MLD was performed prior to and following STM. STm was performed with TpR in the R pectoralis due to pt is reporting tenderness in this area and palpable tightness/tenderness noted with 3x Tp; moderate improvement noted following STM and pt reports less tightness/tenderness. Pt HEP was updated this session with increased resistance for pectoralis antagonist muscle group and for pectoralis stretching in order to decrease activation at the R pectorlais due to over activation most likely related to protective hold of the RUE. Pt will benefit from continued POC at this time.    Personal Factors and Comorbidities Comorbidity 1    Comorbidities R lobectomy with 14 lymph node removal on 05/09/2019    Rehab Potential Good    PT Frequency 2x / week    PT Duration 4 weeks    PT Treatment/Interventions Therapeutic activities;Therapeutic exercise;Neuromuscular re-education;Moist Heat;Iontophoresis  51m/ml Dexamethasone;Manual techniques    PT Next Visit Plan work on MLD more, assess taping, (teach taping if appropriate) P/ROM, assess exercises, easy myofascial release.    PT Home Exercise Plan post op exercises,Access Code: [email or web address]    Consulted and Agree with Plan of Care Patient           Patient will benefit from skilled therapeutic intervention in order to improve the following deficits and impairments:  Increased edema, Increased fascial restricitons, Decreased range of motion, Postural dysfunction, Pain  Visit Diagnosis: Lymphedema, not elsewhere classified  Stiffness of right shoulder, not elsewhere classified  Chronic right shoulder pain  Solitary pulmonary nodule on lung CT  Abnormal posture     Problem List Patient Active Problem List   Diagnosis Date Noted  . S/P thoracotomy 05/09/2019  . Solitary pulmonary nodule on lung CT 04/15/2019  . Upper airway cough syndrome 04/15/2019  . Mixed hyperlipidemia 02/12/2019  . Type 2 diabetes mellitus without complication, without long-term current use of insulin ([place] 02/12/2019  . Palpitations 02/12/2019  . Subungual hematoma of toenail of left foot 06/21/2016  . Diabetes mellitus without complication ([place] 079/89/2119 . DIABETES MELLITUS, TYPE II 11/03/2007  . Essential hypertension 11/03/2007  . ALLERGIC RHINITIS 11/03/2007  . ESOPHAGEAL REFLUX 11/03/2007    CAnder Purpura PT 12/02/2019, 4:09 PM  [place] [address]Phone: [phone]  Fax:  [phone] Name: Holly BrysMRN: [id number] of Birth: 602-25-1947

## 2019-12-04 ENCOUNTER — Ambulatory Visit: Payer: Medicare Other

## 2019-12-04 ENCOUNTER — Other Ambulatory Visit: Payer: Self-pay

## 2019-12-04 DIAGNOSIS — M25611 Stiffness of right shoulder, not elsewhere classified: Secondary | ICD-10-CM

## 2019-12-04 DIAGNOSIS — G8929 Other chronic pain: Secondary | ICD-10-CM

## 2019-12-04 DIAGNOSIS — M25511 Pain in right shoulder: Secondary | ICD-10-CM

## 2019-12-04 DIAGNOSIS — R293 Abnormal posture: Secondary | ICD-10-CM

## 2019-12-04 DIAGNOSIS — I890 Lymphedema, not elsewhere classified: Secondary | ICD-10-CM

## 2019-12-04 DIAGNOSIS — R911 Solitary pulmonary nodule: Secondary | ICD-10-CM

## 2019-12-04 NOTE — Therapy (Signed)
[place], [place], [address] Phone: [phone]   Fax:  [phone]  Physical Therapy Treatment  Patient Details  Name: Holly Harper MRN: [id number] Date of Birth: 16-Feb-1946 Referring Provider (PT): Lavera Guise MD   Encounter Date: 12/04/2019   PT End of Session - 12/04/19 1443    Visit Number 5    Number of Visits 9    Date for PT Re-Evaluation 12/19/19    PT Start Time 1441    PT Stop Time 1534    PT Time Calculation (min) 53 min    Activity Tolerance Patient tolerated treatment well    Behavior During Therapy [facility] for tasks assessed/performed           Past Medical History:  Diagnosis Date  . Allergic rhinitis   . Anxiety   . Asthma   . Diabetes ([place])   . Dysphagia   . Family history of adverse reaction to anesthesia    sister had PONV  . GERD (gastroesophageal reflux disease)   . Hiatal hernia   . Hypercholesterolemia   . Hypertension   . Lung cancer ([place]) 2020  . Paresthesia     Past Surgical History:  Procedure Laterality Date  . COLONOSCOPY  02/10/2016   Mild pancolonic diverticulosis. Otherwise, normal colonoscopy  . ESOPHAGOGASTRODUODENOSCOPY  08/24/2011   Prebyesophagus. Mininal hiatal hernia. Mild gastritis  . ESOPHAGOGASTRODUODENOSCOPY (EGD) WITH PROPOFOL N/A 11/14/2019   Procedure: ESOPHAGOGASTRODUODENOSCOPY (EGD) WITH PROPOFOL;  Surgeon: [name], MD;  [facility];  Service: Endoscopy;  Laterality: N/A;  . EUS N/A 11/14/2019   Procedure: UPPER ENDOSCOPIC ULTRASOUND (EUS) RADIAL;  Surgeon: [name], MD;  Location: [facility];  Service: Endoscopy;  Laterality: N/A;  . FINE NEEDLE ASPIRATION N/A 11/14/2019   Procedure: FINE NEEDLE ASPIRATION (FNA) LINEAR;  Surgeon: [name], MD;  [facility];  Service: Endoscopy;  Laterality: N/A;  . INTERCOSTAL NERVE BLOCK Right 05/09/2019   Procedure: Intercostal Nerve Block  thoracic;  Surgeon: Melrose Nakayama, MD;  Location: [place];  Service: Thoracic;  Laterality: Right;  . LASIK Bilateral   . LIPOMA RESECTION    . LOBECTOMY Right 05/09/2019   Procedure: LOBECTOMY;  Surgeon: Melrose Nakayama, MD;  Location: [place];  Service: Thoracic;  Laterality: Right;  Marland Kitchen VIDEO ASSISTED THORACOSCOPY (VATS)/WEDGE RESECTION Right 05/09/2019   Procedure: VIDEO ASSISTED THORACOSCOPY (VATS)/WEDGE RESECTION FOR FROZEN SECTIONS ;  Surgeon: Melrose Nakayama, MD;  Location: [place];  Service: Thoracic;  Laterality: Right;    There were no vitals filed for this visit.   Subjective Assessment - 12/04/19 1443    Subjective Pt states that she has a little pain where she is unable to keep the foam right under her breast but other than that she is feeling pretty good.    Pertinent History thoracoscopic right upper lobectomy for a T1c, N0, stage Ia adenocarcinoma on 05/09/2019.    Patient Stated Goals I want to decrease my pain and I want to be able to wear a bra.    Currently in Pain? No/denies    Pain Score 0-No pain                             [facility] Adult PT Treatment/Exercise - 12/04/19 0001      Manual Therapy   Manual Therapy Manual Lymphatic Drainage (MLD);Soft tissue mobilization;Myofascial release;Passive ROM;Edema management    Edema Management  1/2 inch gray foam was made smaller on pt request becuase it is sliding out of her bra.     Soft tissue mobilization Easy STM to the R pectoralis, deltoid, biceps with extra time spent at the R pectoralis muscle this session; minimal improvement in the tightness of these muscles by end of session    Myofascial Release Easy myofascial release along the R lateral trunk, axilla and into the lateral breast, along the anterior chest wall cross hand and cross friction w/o significant pull in order to help decrease restriction at the R shoulder to improve fascial adhesions.     Manual Lymphatic Drainage (MLD) In  supine: short neck, swimming in the terminus, bil axillary and R inguinal nodes, anterior inter-axillary anastomosis, R axillo-inguinal anastomosis, superior/inferior breast toward corresponding anastomosis before and re-worked after myofascial release and STM.     Passive ROM Into flexion and abduction multiple times in each direction with easy end range stretch.                   PT Education - 12/04/19 1541    Education Details Pt will continue with exercises and wearing the 1/2 inch gram foam at home. Discussed MLD including skin stretch and direction to help pt she was using finger tips and had difficulty with stretching the skin.    Person(s) Educated Patient    Methods Explanation    Comprehension Verbalized understanding            PT Short Term Goals - 11/07/19 0917      PT SHORT TERM GOAL #1   Title Pt will be independent with HEP and self MLD within 2 weeks for autonomy of care.    Baseline Pt was just provided with HEP and does not know how to perform MLD> 2    Time 2    Period Weeks    Status New    Target Date 11/28/19             PT Long Term Goals - 11/07/19 0917      PT LONG TERM GOAL #1   Title Pt will improve R shoulder flexion to 140, abduction 120, interal rotation to 70 degrees within 4 weeks to demonstrate improve functional ROM.    Baseline R shoulder flexion: 123, abduction: 97, internal rotation 50    Time 4    Period Weeks    Status New    Target Date 12/19/19      PT LONG TERM GOAL #2   Title Pt will improve DASH score to less than 20% disability for the RUE after 4 weeks to demonstrate a subjective improvement in R shoulder function.    Baseline 47%    Time 4    Period Weeks    Status New    Target Date 12/19/19      PT LONG TERM GOAL #3   Title Pt will report 50% improvement or greater in pain and function of the RUE after 4 weeks in order to demonstrate a subjective improvement in quality of life.    Baseline 2/10 pain and  greater with prolonged activity    Time 4    Period Weeks    Status New    Target Date 12/19/19      PT LONG TERM GOAL #4   Title Pt will demonstrate 2cm reduction in thoracic measurement to demonstrate decreased fluid in the posterior quadrants within 4 weeks.    Baseline see measurements.  Time 4    Period Weeks    Status New    Target Date 12/19/19      PT LONG TERM GOAL #5   Title Pt will be measured for and obtain appropriate compression garments to wear on a dialy basis to control edema at home.    Baseline pt has no compression garments.    Time 4    Period Weeks    Status New    Target Date 12/19/19                 Plan - 12/04/19 1442    Clinical Impression Statement Pt continues with softening of the R breast with MLD and use of 1/2 inch gray foam. She continues with fascial tightness at the R lateral, anterior breast; minimal improvement following longitudinal and cross friction myofacial release. Palpable tightness/tenderness continues at the R pectoralis, biceps; minimal improvement following light STM. No trigger points noted this session in the R pectoralis. Pt will benefit from continued POC at this time.    Personal Factors and Comorbidities Comorbidity 1    Comorbidities R lobectomy with 14 lymph node removal on 05/09/2019    Rehab Potential Good    PT Duration 4 weeks    PT Treatment/Interventions Therapeutic activities;Therapeutic exercise;Neuromuscular re-education;Moist Heat;Iontophoresis 4mg /ml Dexamethasone;Manual techniques    PT Next Visit Plan work on MLD more, assess taping, (teach taping if appropriate) P/ROM, assess exercises, easy myofascial release.    PT Home Exercise Plan post op exercises,Access Code: [email or web address]    Consulted and Agree with Plan of Care Patient           Patient will benefit from skilled therapeutic intervention in order to improve the following deficits and impairments:  Increased edema, Increased fascial restricitons,  Decreased range of motion, Postural dysfunction, Pain  Visit Diagnosis: Lymphedema, not elsewhere classified  Stiffness of right shoulder, not elsewhere classified  Chronic right shoulder pain  Solitary pulmonary nodule on lung CT  Abnormal posture     Problem List Patient Active Problem List   Diagnosis Date Noted  . S/P thoracotomy 05/09/2019  . Solitary pulmonary nodule on lung CT 04/15/2019  . Upper airway cough syndrome 04/15/2019  . Mixed hyperlipidemia 02/12/2019  . Type 2 diabetes mellitus without complication, without long-term current use of insulin ([place]) 02/12/2019  . Palpitations 02/12/2019  . Subungual hematoma of toenail of left foot 06/21/2016  . Diabetes mellitus without complication ([place]) 16/60/6301  . DIABETES MELLITUS, TYPE II 11/03/2007  . Essential hypertension 11/03/2007  . ALLERGIC RHINITIS 11/03/2007  . ESOPHAGEAL REFLUX 11/03/2007    Ander Purpura, PT 12/04/2019, 3:47 PM  [place], [place], [address] Phone: [phone]   Fax:  [phone]  Name: Holly Harper MRN: [id number] Date of Birth: 1945-07-27

## 2019-12-09 ENCOUNTER — Other Ambulatory Visit: Payer: Self-pay

## 2019-12-09 ENCOUNTER — Encounter: Payer: Self-pay | Admitting: Physical Therapy

## 2019-12-09 ENCOUNTER — Ambulatory Visit: Payer: Medicare Other | Admitting: Physical Therapy

## 2019-12-09 DIAGNOSIS — M25611 Stiffness of right shoulder, not elsewhere classified: Secondary | ICD-10-CM

## 2019-12-09 DIAGNOSIS — I890 Lymphedema, not elsewhere classified: Secondary | ICD-10-CM | POA: Diagnosis not present

## 2019-12-09 DIAGNOSIS — R293 Abnormal posture: Secondary | ICD-10-CM

## 2019-12-09 NOTE — Therapy (Signed)
[place], [place], [address] Phone: [phone]   Fax:  [phone]  Physical Therapy Treatment  Patient Details  Name: Holly Harper MRN: [id number] Date of Birth: 1946-05-01 Referring Provider (PT): Lavera Guise MD   Encounter Date: 12/09/2019   PT End of Session - 12/09/19 1557    Visit Number 6    Number of Visits 9    Date for PT Re-Evaluation 12/19/19    PT Start Time 1503    PT Stop Time 1556    PT Time Calculation (min) 53 min    Activity Tolerance Patient tolerated treatment well    Behavior During Therapy [facility] for tasks assessed/performed           Past Medical History:  Diagnosis Date  . Allergic rhinitis   . Anxiety   . Asthma   . Diabetes ([place])   . Dysphagia   . Family history of adverse reaction to anesthesia    sister had PONV  . GERD (gastroesophageal reflux disease)   . Hiatal hernia   . Hypercholesterolemia   . Hypertension   . Lung cancer ([place]) 2020  . Paresthesia     Past Surgical History:  Procedure Laterality Date  . COLONOSCOPY  02/10/2016   Mild pancolonic diverticulosis. Otherwise, normal colonoscopy  . ESOPHAGOGASTRODUODENOSCOPY  08/24/2011   Prebyesophagus. Mininal hiatal hernia. Mild gastritis  . ESOPHAGOGASTRODUODENOSCOPY (EGD) WITH PROPOFOL N/A 11/14/2019   Procedure: ESOPHAGOGASTRODUODENOSCOPY (EGD) WITH PROPOFOL;  Surgeon: [name], MD;  [facility];  Service: Endoscopy;  Laterality: N/A;  . EUS N/A 11/14/2019   Procedure: UPPER ENDOSCOPIC ULTRASOUND (EUS) RADIAL;  Surgeon: [name], MD;  [facility];  Service: Endoscopy;  Laterality: N/A;  . FINE NEEDLE ASPIRATION N/A 11/14/2019   Procedure: FINE NEEDLE ASPIRATION (FNA) LINEAR;  Surgeon: [name], MD;  [facility];  Service: Endoscopy;  Laterality: N/A;  . INTERCOSTAL NERVE BLOCK Right 05/09/2019   Procedure: Intercostal Nerve Block  thoracic;  Surgeon: Melrose Nakayama, MD;  Location: [place];  Service: Thoracic;  Laterality: Right;  . LASIK Bilateral   . LIPOMA RESECTION    . LOBECTOMY Right 05/09/2019   Procedure: LOBECTOMY;  Surgeon: Melrose Nakayama, MD;  Location: [place];  Service: Thoracic;  Laterality: Right;  Marland Kitchen VIDEO ASSISTED THORACOSCOPY (VATS)/WEDGE RESECTION Right 05/09/2019   Procedure: VIDEO ASSISTED THORACOSCOPY (VATS)/WEDGE RESECTION FOR FROZEN SECTIONS ;  Surgeon: Melrose Nakayama, MD;  Location: [place];  Service: Thoracic;  Laterality: Right;    There were no vitals filed for this visit.   Subjective Assessment - 12/09/19 1503    Subjective It is still swelling. I can never remember how to move the lymph nodes.    Pertinent History thoracoscopic right upper lobectomy for a T1c, N0, stage Ia adenocarcinoma on 05/09/2019.    Patient Stated Goals I want to decrease my pain and I want to be able to wear a bra.    Currently in Pain? Yes    Pain Score 3     Pain Location Arm    Pain Orientation Right    Pain Descriptors / Indicators Tingling    Pain Type Surgical pain    Pain Onset More than a month ago    Pain Frequency Intermittent                             [facility] Adult PT Treatment/Exercise -  12/09/19 0001      Manual Therapy   Soft tissue mobilization to R pec in area of tightness    Myofascial Release gently across R axilla and R chest    Manual Lymphatic Drainage (MLD) short neck, 5 diaphragmatic breaths, left axillary nodes and establishment of interaxillary pathways, right inguinal nodes and establishment of axillo inguinal pathway, right breast with focus on lateral breast moving fluid towards pathway, answered pt's questions throughout and had pt return demonstrate, gave verbal cues for correct skin stretch    Passive ROM in to flexion and abduction while performing soft tissue mobilization and MFR to R pec muscle                    PT Short Term  Goals - 11/07/19 0917      PT SHORT TERM GOAL #1   Title Pt will be independent with HEP and self MLD within 2 weeks for autonomy of care.    Baseline Pt was just provided with HEP and does not know how to perform MLD> 2    Time 2    Period Weeks    Status New    Target Date 11/28/19             PT Long Term Goals - 11/07/19 0917      PT LONG TERM GOAL #1   Title Pt will improve R shoulder flexion to 140, abduction 120, interal rotation to 70 degrees within 4 weeks to demonstrate improve functional ROM.    Baseline R shoulder flexion: 123, abduction: 97, internal rotation 50    Time 4    Period Weeks    Status New    Target Date 12/19/19      PT LONG TERM GOAL #2   Title Pt will improve DASH score to less than 20% disability for the RUE after 4 weeks to demonstrate a subjective improvement in R shoulder function.    Baseline 47%    Time 4    Period Weeks    Status New    Target Date 12/19/19      PT LONG TERM GOAL #3   Title Pt will report 50% improvement or greater in pain and function of the RUE after 4 weeks in order to demonstrate a subjective improvement in quality of life.    Baseline 2/10 pain and greater with prolonged activity    Time 4    Period Weeks    Status New    Target Date 12/19/19      PT LONG TERM GOAL #4   Title Pt will demonstrate 2cm reduction in thoracic measurement to demonstrate decreased fluid in the posterior quadrants within 4 weeks.    Baseline see measurements.    Time 4    Period Weeks    Status New    Target Date 12/19/19      PT LONG TERM GOAL #5   Title Pt will be measured for and obtain appropriate compression garments to wear on a dialy basis to control edema at home.    Baseline pt has no compression garments.    Time 4    Period Weeks    Status New    Target Date 12/19/19                 Plan - 12/09/19 1558    Clinical Impression Statement Pt reports the grey foam has helped with her swelling. She does not feel  independent with self MLD  so it was reviewed this session. Had pt return demonstrate correct technique and skin stretch. Pt still has tightness across R pec so myofascial release and STM performed to help decrease this and allow improved ROM.    PT Frequency 2x / week    PT Duration 4 weeks    PT Treatment/Interventions Therapeutic activities;Therapeutic exercise;Neuromuscular re-education;Moist Heat;Iontophoresis 4mg /ml Dexamethasone;Manual techniques    PT Next Visit Plan update POC, work on MLD more, assess taping, (teach taping if appropriate) P/ROM, assess exercises, easy myofascial release.    PT Home Exercise Plan post op exercises,Access Code: [email or web address]    Consulted and Agree with Plan of Care Patient           Patient will benefit from skilled therapeutic intervention in order to improve the following deficits and impairments:  Increased edema, Increased fascial restricitons, Decreased range of motion, Postural dysfunction, Pain  Visit Diagnosis: Lymphedema, not elsewhere classified  Stiffness of right shoulder, not elsewhere classified  Abnormal posture     Problem List Patient Active Problem List   Diagnosis Date Noted  . S/P thoracotomy 05/09/2019  . Solitary pulmonary nodule on lung CT 04/15/2019  . Upper airway cough syndrome 04/15/2019  . Mixed hyperlipidemia 02/12/2019  . Type 2 diabetes mellitus without complication, without long-term current use of insulin ([place]) 02/12/2019  . Palpitations 02/12/2019  . Subungual hematoma of toenail of left foot 06/21/2016  . Diabetes mellitus without complication ([place]) 62/70/3500  . DIABETES MELLITUS, TYPE II 11/03/2007  . Essential hypertension 11/03/2007  . ALLERGIC RHINITIS 11/03/2007  . ESOPHAGEAL REFLUX 11/03/2007    Allyson Sabal [facility] 12/09/2019, 4:00 PM  [place], [place], [address] Phone: [phone]   Fax:  [phone]  Name:  Colleene Swarthout MRN: [id number] Date of Birth: 1946-03-14  Manus Gunning, PT 12/09/19 4:00 PM

## 2019-12-11 ENCOUNTER — Ambulatory Visit: Payer: Medicare Other | Admitting: Physical Therapy

## 2019-12-11 ENCOUNTER — Other Ambulatory Visit: Payer: Self-pay

## 2019-12-11 ENCOUNTER — Encounter: Payer: Self-pay | Admitting: Physical Therapy

## 2019-12-11 DIAGNOSIS — I890 Lymphedema, not elsewhere classified: Secondary | ICD-10-CM

## 2019-12-11 NOTE — Therapy (Addendum)
[facility] [address] [place], [place], [address] Phone: [phone]   Fax:  [phone]  Physical Therapy Treatment  Patient Details  Name: Holly Harper MRN: [id number] Date of Birth: 12/30/1945 Referring Provider (PT): Dequincy Lewis MD   Encounter Date: 12/11/2019   PT End of Session - 12/11/19 1523    Visit Number 7    Number of Visits 9    Date for PT Re-Evaluation 12/19/19    PT Start Time 1502    PT Stop Time 1520    PT Time Calculation (min) 18 min    Activity Tolerance Treatment limited secondary to medical complications (Comment)   low blood pressure, dizziness   Behavior During Therapy WFL for tasks assessed/performed           Past Medical History:  Diagnosis Date  . Allergic rhinitis   . Anxiety   . Asthma   . Diabetes (HCC)   . Dysphagia   . Family history of adverse reaction to anesthesia    sister had PONV  . GERD (gastroesophageal reflux disease)   . Hiatal hernia   . Hypercholesterolemia   . Hypertension   . Lung cancer (HCC) 2020  . Paresthesia     Past Surgical History:  Procedure Laterality Date  . COLONOSCOPY  02/10/2016   Mild pancolonic diverticulosis. Otherwise, normal colonoscopy  . ESOPHAGOGASTRODUODENOSCOPY  08/24/2011   Prebyesophagus. Mininal hiatal hernia. Mild gastritis  . ESOPHAGOGASTRODUODENOSCOPY (EGD) WITH PROPOFOL N/A 11/14/2019   Procedure: ESOPHAGOGASTRODUODENOSCOPY (EGD) WITH PROPOFOL;  Surgeon: Jacobs, Daniel P, MD;  Location: [facility];  Service: Endoscopy;  Laterality: N/A;  . EUS N/A 11/14/2019   Procedure: UPPER ENDOSCOPIC ULTRASOUND (EUS) RADIAL;  Surgeon: Jacobs, Daniel P, MD;  Location: [facility];  Service: Endoscopy;  Laterality: N/A;  . FINE NEEDLE ASPIRATION N/A 11/14/2019   Procedure: FINE NEEDLE ASPIRATION (FNA) LINEAR;  Surgeon: Jacobs, Daniel P, MD;  [facility];  Service: Endoscopy;  Laterality: N/A;  . INTERCOSTAL NERVE  BLOCK Right 05/09/2019   Procedure: Intercostal Nerve Block thoracic;  Surgeon: Hendrickson, Steven C, MD;  Location: [facility];  Service: Thoracic;  Laterality: Right;  . LASIK Bilateral   . LIPOMA RESECTION    . LOBECTOMY Right 05/09/2019   Procedure: LOBECTOMY;  Surgeon: Hendrickson, Steven C, MD;  Location: [facility];  Service: Thoracic;  Laterality: Right;  . VIDEO ASSISTED THORACOSCOPY (VATS)/WEDGE RESECTION Right 05/09/2019   Procedure: VIDEO ASSISTED THORACOSCOPY (VATS)/WEDGE RESECTION FOR FROZEN SECTIONS ;  Surgeon: Hendrickson, Steven C, MD;  Location: [facility];  Service: Thoracic;  Laterality: Right;    There were no vitals filed for this visit.   Subjective Assessment - 12/11/19 1504    Subjective I feel dizzy today. I have not eaten very much. I think I am allergic to something in the waiting room. Therapist took BP due to dizziness: 124/54, HR 85, O2 98%. Educated pt to contact doctor since she reports it is usually in the 120s/70s.    Pertinent History thoracoscopic right upper lobectomy for a T1c, N0, stage Ia adenocarcinoma on 05/09/2019.    Patient Stated Goals I want to decrease my pain and I want to be able to wear a bra.    Currently in Pain? Yes    Pain Score 2     Pain Location Arm    Pain Orientation Right               Treatment: See education and assessment sections                        PT Education - 12/11/19 1530    Education Details educated about low diastolic blood pressure, how this can cause dizziness, importance of staying hydrated and eating, importance of contacting doctor due to recent onset of dizziness and difficulty swallowing    Person(s) Educated Patient    Methods Explanation    Comprehension Verbalized understanding            PT Short Term Goals - 11/07/19 0917      PT SHORT TERM GOAL #1   Title Pt will be independent with HEP and self MLD within 2 weeks for autonomy of care.    Baseline Pt was just provided with HEP  and does not know how to perform MLD> 2    Time 2    Period Weeks    Status New    Target Date 11/28/19             PT Long Term Goals - 11/07/19 0917      PT LONG TERM GOAL #1   Title Pt will improve R shoulder flexion to 140, abduction 120, interal rotation to 70 degrees within 4 weeks to demonstrate improve functional ROM.    Baseline R shoulder flexion: 123, abduction: 97, internal rotation 50    Time 4    Period Weeks    Status New    Target Date 12/19/19      PT LONG TERM GOAL #2   Title Pt will improve DASH score to less than 20% disability for the RUE after 4 weeks to demonstrate a subjective improvement in R shoulder function.    Baseline 47%    Time 4    Period Weeks    Status New    Target Date 12/19/19      PT LONG TERM GOAL #3   Title Pt will report 50% improvement or greater in pain and function of the RUE after 4 weeks in order to demonstrate a subjective improvement in quality of life.    Baseline 2/10 pain and greater with prolonged activity    Time 4    Period Weeks    Status New    Target Date 12/19/19      PT LONG TERM GOAL #4   Title Pt will demonstrate 2cm reduction in thoracic measurement to demonstrate decreased fluid in the posterior quadrants within 4 weeks.    Baseline see measurements.    Time 4    Period Weeks    Status New    Target Date 12/19/19      PT LONG TERM GOAL #5   Title Pt will be measured for and obtain appropriate compression garments to wear on a dialy basis to control edema at home.    Baseline pt has no compression garments.    Time 4    Period Weeks    Status New    Target Date 12/19/19                 Plan - 12/11/19 1524    Clinical Impression Statement Pt presented to PT today with complaints of dizziness. She reports this just recently started and that she has not eaten much today. When asked why she had not eaten much, pt reports that she is having difficulty swallowing which began at her last scan. She  has not talked with a doctor about this yet. She plans on discussing this with her cancer doctor on Friday. Blood pressure taken while pt was lying down was 124/54. HR was 84,  O2 was 98%. Pt reports that normally her blood pressure is 120s/70s. Educated pt that diastolic under 60 is low and pt should contact her doctor especially with recent onset of symptoms. Pt agreed and did not wish to continue therapy appointment today. She wanted to go get something to drink and eat in her car. Remeasured BP before she left and it was 124/71. She declined offer for water. Pt stated she felt well enough to drive and was going to contact her doctor.    Comorbidities R lobectomy with 14 lymph node removal on 05/09/2019    PT Frequency 2x / week    PT Duration 4 weeks    PT Treatment/Interventions Therapeutic activities;Therapeutic exercise;Neuromuscular re-education;Moist Heat;Iontophoresis 4mg/ml Dexamethasone;Manual techniques    PT Next Visit Plan see if pt saw dr for low BP and swallowing issues, work on MLD more, assess taping, (teach taping if appropriate) P/ROM, assess exercises, easy myofascial release.    PT Home Exercise Plan post op exercises,Access Code: [email or web address]    Consulted and Agree with Plan of Care Patient           Patient will benefit from skilled therapeutic intervention in order to improve the following deficits and impairments:  Increased edema, Increased fascial restricitons, Decreased range of motion, Postural dysfunction, Pain  Visit Diagnosis: Lymphedema, not elsewhere classified     Problem List Patient Active Problem List   Diagnosis Date Noted  . S/P thoracotomy 05/09/2019  . Solitary pulmonary nodule on lung CT 04/15/2019  . Upper airway cough syndrome 04/15/2019  . Mixed hyperlipidemia 02/12/2019  . Type 2 diabetes mellitus without complication, without long-term current use of insulin (HCC) 02/12/2019  . Palpitations 02/12/2019  . Subungual hematoma of toenail of left  foot 06/21/2016  . Diabetes mellitus without complication (HCC) 06/21/2016  . DIABETES MELLITUS, TYPE II 11/03/2007  . Essential hypertension 11/03/2007  . ALLERGIC RHINITIS 11/03/2007  . ESOPHAGEAL REFLUX 11/03/2007    Blaire Breedlove Blue 12/11/2019, 3:31 PM  [facility] [address] [place], [place], [address] Phone: [phone]   Fax:  [phone]  Name: Holly Harper MRN: [id number] Date of Birth: 05/22/1946  Blaire Breedlove Blue, PT 12/11/19 3:31 PM  PHYSICAL THERAPY DISCHARGE SUMMARY  Visits from Start of Care: 7  Current functional level related to goals / functional outcomes: See above   Remaining deficits: See above   Education / Equipment: HEP, compression  Plan: Patient agrees to discharge.  Patient goals were not met. Patient is being discharged due to not returning since the last visit.  ?????     Blaire Breedlove Blue, PT 05/28/20 2:57 PM  

## 2019-12-13 DIAGNOSIS — C259 Malignant neoplasm of pancreas, unspecified: Secondary | ICD-10-CM | POA: Insufficient documentation

## 2019-12-16 ENCOUNTER — Ambulatory Visit: Payer: Medicare Other | Admitting: Physical Therapy

## 2019-12-26 ENCOUNTER — Encounter: Payer: Self-pay | Admitting: Oncology

## 2019-12-26 DIAGNOSIS — C3411 Malignant neoplasm of upper lobe, right bronchus or lung: Secondary | ICD-10-CM | POA: Diagnosis not present

## 2019-12-30 DIAGNOSIS — J939 Pneumothorax, unspecified: Secondary | ICD-10-CM | POA: Insufficient documentation

## 2020-01-11 ENCOUNTER — Other Ambulatory Visit: Payer: Self-pay | Admitting: Allergy and Immunology

## 2020-01-15 DIAGNOSIS — C3411 Malignant neoplasm of upper lobe, right bronchus or lung: Secondary | ICD-10-CM | POA: Diagnosis not present

## 2020-01-30 ENCOUNTER — Telehealth: Payer: Self-pay | Admitting: Cardiology

## 2020-01-30 NOTE — Telephone Encounter (Signed)
STAT if patient feels like he/she is going to faint   1) Are you dizzy now? No, was earlier today   2) Do you feel faint or have you passed out? No   3) Do you have any other symptoms? No   4) Have you checked your HR and BP (record if available)? Patient states she is on Chemo, she believes some of her medication might be reacting to it and causing her to feeling dizzy.  They told her at the cancer center medication might need to be reduced.   BP 83/55 HR 71

## 2020-01-30 NOTE — Telephone Encounter (Signed)
Spoke with the patient just now and let her know Dr. Terrial Rhodes recommendations. Due to the patients chemo schdule and appointments the soonest I was able to schedule her was the 25th with Dr. Bettina Gavia. She verbalizes understanding and thanks me for the call back.    Encouraged patient to call back with any questions or concerns.

## 2020-01-30 NOTE — Addendum Note (Signed)
Addended by: Resa Miner I on: 01/30/2020 03:34 PM   Modules accepted: Orders

## 2020-01-30 NOTE — Telephone Encounter (Signed)
Have the patient stop the olmesartan/hydrochlorothiazide combination.  And have her see Dr. Bettina Gavia or me in the next week.

## 2020-02-03 DIAGNOSIS — C3411 Malignant neoplasm of upper lobe, right bronchus or lung: Secondary | ICD-10-CM | POA: Diagnosis not present

## 2020-02-05 ENCOUNTER — Other Ambulatory Visit: Payer: Self-pay | Admitting: Allergy and Immunology

## 2020-02-10 ENCOUNTER — Encounter: Payer: Self-pay | Admitting: Allergy and Immunology

## 2020-02-10 ENCOUNTER — Other Ambulatory Visit: Payer: Self-pay

## 2020-02-10 ENCOUNTER — Ambulatory Visit (INDEPENDENT_AMBULATORY_CARE_PROVIDER_SITE_OTHER): Payer: Medicare Other | Admitting: Allergy and Immunology

## 2020-02-10 VITALS — BP 128/80 | HR 66 | Resp 16

## 2020-02-10 DIAGNOSIS — J3089 Other allergic rhinitis: Secondary | ICD-10-CM | POA: Diagnosis not present

## 2020-02-10 DIAGNOSIS — J4530 Mild persistent asthma, uncomplicated: Secondary | ICD-10-CM | POA: Diagnosis not present

## 2020-02-10 DIAGNOSIS — B3781 Candidal esophagitis: Secondary | ICD-10-CM

## 2020-02-10 DIAGNOSIS — K219 Gastro-esophageal reflux disease without esophagitis: Secondary | ICD-10-CM | POA: Diagnosis not present

## 2020-02-10 DIAGNOSIS — K1232 Oral mucositis (ulcerative) due to other drugs: Secondary | ICD-10-CM

## 2020-02-10 DIAGNOSIS — B370 Candidal stomatitis: Secondary | ICD-10-CM

## 2020-02-10 MED ORDER — FLUCONAZOLE 150 MG PO TABS: ORAL_TABLET | ORAL | 0 refills | Status: DC

## 2020-02-10 MED ORDER — OMEPRAZOLE 40 MG PO CPDR: 40.0000 mg | DELAYED_RELEASE_CAPSULE | Freq: Two times a day (BID) | ORAL | 5 refills | Status: DC

## 2020-02-10 MED ORDER — NYSTATIN 100000 UNIT/ML MT SUSP: OROMUCOSAL | 0 refills | Status: DC

## 2020-02-10 NOTE — Patient Instructions (Addendum)
  1. Continue montelukast 10 mg daily  2. Increase omeprazole 40 mg two times per day + famotidine 40 mg in evening   3.  Continue Flonase + azelastine 1 spray each nostril 1-2 times per day during periods of upper airway symptoms. Avoid right nostril for a few days.  4. Continue ProAir HFA 2 puffs every 4-6 hours if needed  5. Start treatment for fungus overgrowth:   A. Diflucan 150 - single tablet today  B. Nystatin - 5 mls swish and swallow 3 times per day for 10 days  6. Return to clinic in December 2021 or earlier if problem  7. Obtain fall flu vaccine and COVID booster.

## 2020-02-10 NOTE — Progress Notes (Signed)
[facility]   Follow-up Note  Referring Provider: Garwin Brothers, MD Primary Provider: Garwin Brothers, MD Date of Office Visit: 02/10/2020  Subjective:   Holly Harper (DOB: 02/24/1946) is a 74 y.o. female who returns to the [facility] [place] on 02/10/2020 in re-evaluation of the following:  HPI: Holly Harper presents to this clinic in evaluation of mucus problems.  She is followed in this clinic for asthma and allergic rhinitis and LPR and I last saw her in his clinic on 14 October 2019.  She has not been having any problems with asthma or allergic rhinitis and rarely uses a short acting bronchodilator and overall feels as though she is doing quite well regarding that issue.  What is new for Holly Harper over the course of the past 4 days or so is lots of burning in her throat and it feels as though this burning is all the way from her throat down into her abdomen.  She feels as though her throat might be a little bit swollen.  She has some difficulty swallowing pills.  She can talk without any difficulty.  She has not noticed any voice change nor has her sister who accompanies her today during this visit.  She has no other associated systemic or constitutional symptoms other than the fact that there is a little bit of burning up in her right nostril.  She is on her third cycle of chemotherapy for pancreatic cancer in anticipation of undergoing 6 cycles and then having a Whipple procedure performed.  She finished her last cycle on 05 February 2020.  Allergies as of 02/10/2020      Reactions   Amoxicillin Hives   Did it involve swelling of the face/tongue/throat, SOB, or low BP? No Did it involve sudden or severe rash/hives, skin peeling, or any reaction on the inside of your mouth or nose? Yes Did you need to seek medical attention at a hospital or doctor's office? Yes When did it last happen? ~2015 If all above answers are "NO", may proceed with  cephalosporin use.   Cinobac [cinoxacin] Other (See Comments)   Unknown    Macrodantin [nitrofurantoin Macrocrystal] Hives   Semaglutide Nausea Only   rybelsus       Medication List    acetaminophen 325 MG tablet Commonly known as: TYLENOL Take 2 tablets (650 mg total) by mouth every 6 (six) hours as needed for mild pain.   albuterol 108 (90 Base) MCG/ACT inhaler Commonly known as: VENTOLIN HFA USE 2 INHALATIONS ORALLY   EVERY 4 TO 6 HOURS AS      NEEDED FOR COUGH OR WHEEZE   aspirin EC 81 MG tablet Take 1 tablet (81 mg total) by mouth daily.   atenolol 50 MG tablet Commonly known as: TENORMIN Take 1 tablet (50 mg total) by mouth daily.   atorvastatin 10 MG tablet Commonly known as: LIPITOR Take 1 tablet (10 mg total) by mouth at bedtime.   azelastine 0.1 % nasal spray Commonly known as: ASTELIN Can use two sprays in each nostril twice daily if needed.   cetirizine 10 MG tablet Commonly known as: ZYRTEC Take 10 mg by mouth daily as needed for allergies.   famotidine 40 MG tablet Commonly known as: Pepcid Take 1 tablet (40 mg total) by mouth at bedtime.   Farxiga 5 MG Tabs tablet Generic drug: dapagliflozin propanediol Take 5 mg by mouth daily.   fluticasone 50 MCG/ACT nasal spray Commonly  known as: FLONASE Can use one spray in each nostril one to two times daily if needed.   gabapentin 300 MG capsule Commonly known as: NEURONTIN Take 1 capsule (300 mg total) by mouth 3 (three) times daily.   glipiZIDE 5 MG 24 hr tablet Commonly known as: GLUCOTROL XL Take 5 mg by mouth daily with breakfast.   LUBRICATING EYE DROPS OP Place 1 drop into both eyes daily as needed (dry eyes).   Melatonin 12 MG Tbdp Take 12 mg by mouth at bedtime.   metFORMIN 500 MG 24 hr tablet Commonly known as: GLUCOPHAGE-XR Take 500 mg by mouth 3 (three) times daily.   montelukast 10 MG tablet Commonly known as: SINGULAIR TAKE 1 TABLET BY MOUTH EVERY DAY   omeprazole 40 MG  capsule Commonly known as: PRILOSEC TAKE 1 CAPSULE BY MOUTH EVERY DAY   ondansetron 4 MG tablet Commonly known as: ZOFRAN Take 4 mg by mouth every 4 (four) hours as needed.   prochlorperazine 10 MG tablet Commonly known as: COMPAZINE Take 10 mg by mouth every 6 (six) hours as needed.       Past Medical History:  Diagnosis Date  . Allergic rhinitis   . Anxiety   . Asthma   . Diabetes ([place])   . Dysphagia   . Family history of adverse reaction to anesthesia    sister had PONV  . GERD (gastroesophageal reflux disease)   . Hiatal hernia   . Hypercholesterolemia   . Hypertension   . Lung cancer ([place]) 2020  . Paresthesia     Past Surgical History:  Procedure Laterality Date  . COLONOSCOPY  02/10/2016   Mild pancolonic diverticulosis. Otherwise, normal colonoscopy  . ESOPHAGOGASTRODUODENOSCOPY  08/24/2011   Prebyesophagus. Mininal hiatal hernia. Mild gastritis  . ESOPHAGOGASTRODUODENOSCOPY (EGD) WITH PROPOFOL N/A 11/14/2019   Procedure: ESOPHAGOGASTRODUODENOSCOPY (EGD) WITH PROPOFOL;  Surgeon: [name], MD;  [facility];  Service: Endoscopy;  Laterality: N/A;  . EUS N/A 11/14/2019   Procedure: UPPER ENDOSCOPIC ULTRASOUND (EUS) RADIAL;  Surgeon: [name], MD;  [facility];  Service: Endoscopy;  Laterality: N/A;  . FINE NEEDLE ASPIRATION N/A 11/14/2019   Procedure: FINE NEEDLE ASPIRATION (FNA) LINEAR;  Surgeon: [name], MD;  [facility];  Service: Endoscopy;  Laterality: N/A;  . INTERCOSTAL NERVE BLOCK Right 05/09/2019   Procedure: Intercostal Nerve Block thoracic;  Surgeon: Melrose Nakayama, MD;  Location: [facility];  Service: Thoracic;  Laterality: Right;  . LASIK Bilateral   . LIPOMA RESECTION    . LOBECTOMY Right 05/09/2019   Procedure: LOBECTOMY;  Surgeon: Melrose Nakayama, MD;  Location: [facility];  Service: Thoracic;  Laterality: Right;  Marland Kitchen VIDEO ASSISTED THORACOSCOPY (VATS)/WEDGE RESECTION Right 05/09/2019    Procedure: VIDEO ASSISTED THORACOSCOPY (VATS)/WEDGE RESECTION FOR FROZEN SECTIONS ;  Surgeon: Melrose Nakayama, MD;  [facility];  Service: Thoracic;  Laterality: Right;    Review of systems negative except as noted in HPI / PMHx or noted below:  Review of Systems  Constitutional: Negative.   HENT: Negative.   Eyes: Negative.   Respiratory: Negative.   Cardiovascular: Negative.   Gastrointestinal: Negative.   Genitourinary: Negative.   Musculoskeletal: Negative.   Skin: Negative.   Neurological: Negative.   Endo/Heme/Allergies: Negative.   Psychiatric/Behavioral: Negative.      Objective:   Vitals:   02/10/20 0958  BP: 128/80  Pulse: 66  Resp: 16  SpO2: 98%  Physical Exam Constitutional:      Appearance: She is not diaphoretic.  HENT:     Head: Normocephalic.     Right Ear: Tympanic membrane, ear canal and external ear normal.     Left Ear: Tympanic membrane, ear canal and external ear normal.     Nose: Nose normal. No mucosal edema or rhinorrhea.     Mouth/Throat:     Pharynx: Uvula midline. Posterior oropharyngeal erythema present. No oropharyngeal exudate.  Eyes:     Conjunctiva/sclera: Conjunctivae normal.  Neck:     Thyroid: No thyromegaly.     Trachea: Trachea normal. No tracheal tenderness or tracheal deviation.  Cardiovascular:     Rate and Rhythm: Normal rate and regular rhythm.     Heart sounds: Normal heart sounds, S1 normal and S2 normal. No murmur heard.   Pulmonary:     Effort: No respiratory distress.     Breath sounds: Normal breath sounds. No stridor. No wheezing or rales.  Lymphadenopathy:     Head:     Right side of head: No tonsillar adenopathy.     Left side of head: No tonsillar adenopathy.     Cervical: No cervical adenopathy.  Skin:    Findings: No erythema or rash.     Nails: There is no clubbing.  Neurological:     Mental Status: She is alert.     Diagnostics:    Spirometry was performed and demonstrated  an FEV1 of 1.30 at 108 % of predicted.   Assessment and Plan:   1. Asthma, well controlled, mild persistent   2. Other allergic rhinitis   3. LPRD (laryngopharyngeal reflux disease)   4. Thrush of mouth and esophagus ([place])   5. Mucositis due to drugs     1. Continue montelukast 10 mg daily  2. Increase omeprazole 40 mg two times per day + famotidine 40 mg in evening   3.  Continue Flonase + azelastine 1 spray each nostril 1-2 times per day during periods of upper airway symptoms. Avoid right nostril for a few days.  4. Continue ProAir HFA 2 puffs every 4-6 hours if needed  5. Start treatment for fungus overgrowth:   A. Diflucan 150 - single tablet today  B. Nystatin - 5 mls swish and swallow 3 times per day for 10 days  6. Return to clinic in December 2021 or earlier if problem  7. Obtain fall flu vaccine and COVID booster.  The most likely cause of the Malky's burning sensation is mucositis from her chemotherapy.  However, she does have very bad reflux and her chemotherapy could also have precipitated some fungal overgrowth in her oral cavity and esophagus and we will treat her for these 2 conditions by increasing her therapy for reflux and having her use a combination of a single Diflucan tablet and nystatin swish and swallow.  If she does well with this plan I will see her back in this clinic in December 2021 or earlier if there is a problem.  [name], MD Allergy / Immunology [place]

## 2020-02-11 ENCOUNTER — Encounter: Payer: Self-pay | Admitting: Allergy and Immunology

## 2020-02-11 ENCOUNTER — Other Ambulatory Visit: Payer: Self-pay

## 2020-02-11 DIAGNOSIS — K449 Diaphragmatic hernia without obstruction or gangrene: Secondary | ICD-10-CM | POA: Insufficient documentation

## 2020-02-11 DIAGNOSIS — K219 Gastro-esophageal reflux disease without esophagitis: Secondary | ICD-10-CM | POA: Insufficient documentation

## 2020-02-11 DIAGNOSIS — I10 Essential (primary) hypertension: Secondary | ICD-10-CM | POA: Insufficient documentation

## 2020-02-11 DIAGNOSIS — F419 Anxiety disorder, unspecified: Secondary | ICD-10-CM | POA: Insufficient documentation

## 2020-02-11 DIAGNOSIS — J45909 Unspecified asthma, uncomplicated: Secondary | ICD-10-CM | POA: Insufficient documentation

## 2020-02-11 DIAGNOSIS — R202 Paresthesia of skin: Secondary | ICD-10-CM | POA: Insufficient documentation

## 2020-02-11 DIAGNOSIS — E119 Type 2 diabetes mellitus without complications: Secondary | ICD-10-CM | POA: Insufficient documentation

## 2020-02-11 DIAGNOSIS — R1310 Dysphagia, unspecified: Secondary | ICD-10-CM | POA: Insufficient documentation

## 2020-02-11 DIAGNOSIS — Z8489 Family history of other specified conditions: Secondary | ICD-10-CM | POA: Insufficient documentation

## 2020-02-11 DIAGNOSIS — E7800 Pure hypercholesterolemia, unspecified: Secondary | ICD-10-CM | POA: Insufficient documentation

## 2020-02-11 DIAGNOSIS — J309 Allergic rhinitis, unspecified: Secondary | ICD-10-CM | POA: Insufficient documentation

## 2020-02-11 NOTE — Progress Notes (Signed)
Cardiology Office Note:    Date:  02/12/2020   ID:  Holly Harper [place], DOB 12-20-1945, MRN [id number]  PCP:  Garwin Brothers, MD  Cardiologist:  Shirlee More, MD    Referring MD: Garwin Brothers, MD    ASSESSMENT:    1. Hypotension due to drugs   2. Essential hypertension   3. Mixed hyperlipidemia   4. Paroxysmal atrial fibrillation (HCC)    PLAN:    In order of problems listed above:  1. Improved I would not restart ACE inhibitor diuretic continue beta-blocker and monitor blood pressure contact me if consistently greater than 140. 2. At target current treatment beta-blocker 3. Stable lipids at target continue statin 4. No recurrence no longer an antiarrhythmic drug it was related to her lung surgery for cancer   Next appointment: 6 months   Medication Adjustments/Labs and Tests Ordered: Current medicines are reviewed at length with the patient today.  Concerns regarding medicines are outlined above.  No orders of the defined types were placed in this encounter.  No orders of the defined types were placed in this encounter.   Chief Complaint  Patient presents with  . Follow-up  . Hypertension    History of Present Illness:    Holly Harper is a 74 y.o. female with a hx of difficult to control hypertension and lung cancer. I reviewed the [facility] records from her admission to the hospital 05/09/2019 at 05/15/2019 with resection of adenocarcinoma right upper lobe T1N0 stage Ia with thoracotomy.  Her course was complicated by sinus tachycardia hypotension requiring IV fluids and a small air leak in her chest tube. She developed atrial fibrillation and was treated with amiodarone and converted to sinus rhythm.  She was seen by thoracic surgery today 6 months remote from operative intervention and no evidence of recurrent disease.On normal surveillance imaging in April 2021, she was found to have a pancreatic mass. Subsequent biopsy showed pancreatic adenocarcinoma.   This diagnosed pancreatic adenocarcinoma comes for review of care everywhere.  She was last seen 11/12/2019. Compliance with diet, lifestyle and medications: Yes  She had symptomatic hypotension told to call office ACE inhibitor diuretic discontinued and blood pressure is now running 7/34/1937 systolic since several times a week in the cancer treatment program for chemotherapy preceding surgery for pancreatic cancer she has poor appetite she has some diarrhea but she also takes an SGLT2 inhibitor that has a distal diuretic effect.  I think the best thing to do is leave her off that agent continue her beta-blocker and monitor blood pressure.  No chest pain shortness of breath palpitation or syncope. Past Medical History:  Diagnosis Date  . Allergic rhinitis   . Anxiety   . Asthma   . Diabetes ([place])   . Dysphagia   . Family history of adverse reaction to anesthesia    sister had PONV  . GERD (gastroesophageal reflux disease)   . Hiatal hernia   . Hypercholesterolemia   . Hypertension   . Lung cancer ([place]) 2020  . Paresthesia     Past Surgical History:  Procedure Laterality Date  . COLONOSCOPY  02/10/2016   Mild pancolonic diverticulosis. Otherwise, normal colonoscopy  . ESOPHAGOGASTRODUODENOSCOPY  08/24/2011   Prebyesophagus. Mininal hiatal hernia. Mild gastritis  . ESOPHAGOGASTRODUODENOSCOPY (EGD) WITH PROPOFOL N/A 11/14/2019   Procedure: ESOPHAGOGASTRODUODENOSCOPY (EGD) WITH PROPOFOL;  Surgeon: [name], MD;  Location: [facility];  Service: Endoscopy;  Laterality: N/A;  . EUS N/A 11/14/2019   Procedure: UPPER ENDOSCOPIC ULTRASOUND (EUS)  RADIAL;  Surgeon: [name], MD;  Location: [facility];  Service: Endoscopy;  Laterality: N/A;  . FINE NEEDLE ASPIRATION N/A 11/14/2019   Procedure: FINE NEEDLE ASPIRATION (FNA) LINEAR;  Surgeon: [name], MD;  Location: [facility];  Service: Endoscopy;  Laterality: N/A;  . INTERCOSTAL NERVE BLOCK Right 05/09/2019    Procedure: Intercostal Nerve Block thoracic;  Surgeon: Melrose Nakayama, MD;  Location: [facility];  Service: Thoracic;  Laterality: Right;  . LASIK Bilateral   . LIPOMA RESECTION    . LOBECTOMY Right 05/09/2019   Procedure: LOBECTOMY;  Surgeon: Melrose Nakayama, MD;  Location: [facility];  Service: Thoracic;  Laterality: Right;  Marland Kitchen VIDEO ASSISTED THORACOSCOPY (VATS)/WEDGE RESECTION Right 05/09/2019   Procedure: VIDEO ASSISTED THORACOSCOPY (VATS)/WEDGE RESECTION FOR FROZEN SECTIONS ;  Surgeon: Melrose Nakayama, MD;  [facility];  Service: Thoracic;  Laterality: Right;    Current Medications: Current Meds  Medication Sig  . acetaminophen (TYLENOL) 325 MG tablet Take 2 tablets (650 mg total) by mouth every 6 (six) hours as needed for mild pain. (Patient taking differently: Take 325 mg by mouth 2 (two) times daily as needed for mild pain. )  . albuterol (VENTOLIN HFA) 108 (90 Base) MCG/ACT inhaler USE 2 INHALATIONS ORALLY   EVERY 4 TO 6 HOURS AS      NEEDED FOR COUGH OR WHEEZE (Patient taking differently: Inhale 2 puffs into the lungs every 4 (four) hours as needed for wheezing or shortness of breath. )  . aspirin EC 81 MG tablet Take 1 tablet (81 mg total) by mouth daily.  Marland Kitchen atenolol (TENORMIN) 50 MG tablet Take 1 tablet (50 mg total) by mouth daily.  Marland Kitchen atorvastatin (LIPITOR) 10 MG tablet Take 1 tablet (10 mg total) by mouth at bedtime.  Marland Kitchen azelastine (ASTELIN) 0.1 % nasal spray Can use two sprays in each nostril twice daily if needed. (Patient taking differently: Place 1 spray into both nostrils 2 (two) times daily as needed for rhinitis or allergies. )  . Carboxymethylcellul-Glycerin (LUBRICATING EYE DROPS OP) Place 1 drop into both eyes daily as needed (dry eyes).  . cetirizine (ZYRTEC) 10 MG tablet Take 10 mg by mouth daily as needed for allergies.  . dapagliflozin propanediol (FARXIGA) 5 MG TABS tablet Take 5 mg by mouth daily.  . famotidine (PEPCID) 40 MG tablet Take 1 tablet (40 mg  total) by mouth at bedtime.  . fluconazole (DIFLUCAN) 150 MG tablet Take 1 tablet by mouth today  . fluticasone (FLONASE) 50 MCG/ACT nasal spray Can use one spray in each nostril one to two times daily if needed. (Patient taking differently: Place 1 spray into both nostrils daily as needed for allergies. )  . gabapentin (NEURONTIN) 300 MG capsule Take 1 capsule (300 mg total) by mouth 3 (three) times daily. (Patient taking differently: Take 300 mg by mouth in the morning and at bedtime. )  . glipiZIDE (GLUCOTROL XL) 5 MG 24 hr tablet Take 5 mg by mouth daily with breakfast.  . lidocaine-prilocaine (EMLA) cream Apply topically.  . Melatonin 12 MG TBDP Take 12 mg by mouth at bedtime.  . metFORMIN (GLUCOPHAGE-XR) 500 MG 24 hr tablet Take 500 mg by mouth 3 (three) times daily.  . montelukast (SINGULAIR) 10 MG tablet TAKE 1 TABLET BY MOUTH EVERY DAY  . nystatin (MYCOSTATIN) 100000 UNIT/ML suspension Swish and swallow 22ml by mouth three times daily for 10 days  . omeprazole (PRILOSEC) 40 MG capsule Take 1 capsule (40 mg total)  by mouth 2 (two) times daily.  . ondansetron (ZOFRAN) 4 MG tablet Take 4 mg by mouth every 4 (four) hours as needed.  . prochlorperazine (COMPAZINE) 10 MG tablet Take 10 mg by mouth every 6 (six) hours as needed.  Marland Kitchen Propylene Glycol (SYSTANE BALANCE) 0.6 % SOLN 1 drop as needed.     Allergies:   Amoxicillin, Cinobac [cinoxacin], Macrodantin [nitrofurantoin macrocrystal], and Semaglutide   Social History   Socioeconomic History  . Marital status: Married    Spouse name: Not on file  . Number of children: Not on file  . Years of education: Not on file  . Highest education level: Not on file  Occupational History  . Not on file  Tobacco Use  . Smoking status: Never Smoker  . Smokeless tobacco: Never Used  Vaping Use  . Vaping Use: Never used  Substance and Sexual Activity  . Alcohol use: Not Currently    Comment: quit drinking years ago   . Drug use: Not Currently    . Sexual activity: Not on file  Other Topics Concern  . Not on file  Social History Narrative  . Not on file   Social Determinants of Health   Financial Resource Strain:   . Difficulty of Paying Living Expenses: Not on file  Food Insecurity:   . Worried About Charity fundraiser in the Last Year: Not on file  . Ran Out of Food in the Last Year: Not on file  Transportation Needs:   . Lack of Transportation (Medical): Not on file  . Lack of Transportation (Non-Medical): Not on file  Physical Activity:   . Days of Exercise per Week: Not on file  . Minutes of Exercise per Session: Not on file  Stress:   . Feeling of Stress : Not on file  Social Connections:   . Frequency of Communication with Friends and Family: Not on file  . Frequency of Social Gatherings with Friends and Family: Not on file  . Attends Religious Services: Not on file  . Active Member of Clubs or Organizations: Not on file  . Attends Archivist Meetings: Not on file  . Marital Status: Not on file     Family History: The patient's family history includes Bipolar disorder in her sister; Lung cancer in her brother; Prostate cancer in her father. There is no history of Colon cancer or Esophageal cancer. ROS:   Please see the history of present illness.    All other systems reviewed and are negative.  EKGs/Labs/Other Studies Reviewed:    The following studies were reviewed today:    Recent Labs: 02/13/2019: TSH 0.874 05/07/2019: ALT 25 05/11/2019: BUN 14; Creatinine, Ser 0.91; Hemoglobin 8.5; Platelets 150; Potassium 4.2; Sodium 135  Recent Lipid Panel 09/09/2019 cholesterol 103 triglycerides 136 HDL 44 LDL 41  Physical Exam:    VS:  BP (!) 143/73   Pulse 64   Ht 5\' 3"  (1.6 m)   Wt 107 lb 9.6 oz (48.8 kg)   SpO2 98%   BMI 19.06 kg/m     Wt Readings from Last 3 Encounters:  02/12/20 107 lb 9.6 oz (48.8 kg)  11/14/19 113 lb 1.5 oz (51.3 kg)  11/12/19 114 lb (51.7 kg)     GEN:  Well  nourished, well developed in no acute distress HEENT: Normal NECK: No JVD; No carotid bruits LYMPHATICS: No lymphadenopathy CARDIAC: RRR, no murmurs, rubs, gallops RESPIRATORY:  Clear to auscultation without rales, wheezing or rhonchi  ABDOMEN:  Soft, non-tender, non-distended MUSCULOSKELETAL:  No edema; No deformity  SKIN: Warm and dry NEUROLOGIC:  Alert and oriented x 3 PSYCHIATRIC:  Normal affect    Signed, Shirlee More, MD  02/12/2020 3:01 PM    [facility]

## 2020-02-12 ENCOUNTER — Ambulatory Visit (INDEPENDENT_AMBULATORY_CARE_PROVIDER_SITE_OTHER): Payer: Medicare Other | Admitting: Cardiology

## 2020-02-12 ENCOUNTER — Other Ambulatory Visit: Payer: Self-pay

## 2020-02-12 ENCOUNTER — Encounter: Payer: Self-pay | Admitting: Cardiology

## 2020-02-12 VITALS — BP 143/73 | HR 64 | Ht 63.0 in | Wt 107.6 lb

## 2020-02-12 DIAGNOSIS — I480 Paroxysmal atrial fibrillation: Secondary | ICD-10-CM

## 2020-02-12 DIAGNOSIS — I952 Hypotension due to drugs: Secondary | ICD-10-CM | POA: Diagnosis not present

## 2020-02-12 DIAGNOSIS — I10 Essential (primary) hypertension: Secondary | ICD-10-CM | POA: Diagnosis not present

## 2020-02-12 DIAGNOSIS — E782 Mixed hyperlipidemia: Secondary | ICD-10-CM

## 2020-02-12 NOTE — Patient Instructions (Signed)

## 2020-02-16 ENCOUNTER — Other Ambulatory Visit: Payer: Self-pay | Admitting: Allergy and Immunology

## 2020-02-17 DIAGNOSIS — C3411 Malignant neoplasm of upper lobe, right bronchus or lung: Secondary | ICD-10-CM | POA: Diagnosis not present

## 2020-02-21 ENCOUNTER — Other Ambulatory Visit: Payer: Self-pay | Admitting: Thoracic Surgery (Cardiothoracic Vascular Surgery)

## 2020-02-24 ENCOUNTER — Other Ambulatory Visit: Payer: Self-pay | Admitting: Thoracic Surgery (Cardiothoracic Vascular Surgery)

## 2020-03-02 DIAGNOSIS — C250 Malignant neoplasm of head of pancreas: Secondary | ICD-10-CM | POA: Diagnosis not present

## 2020-03-02 DIAGNOSIS — C3411 Malignant neoplasm of upper lobe, right bronchus or lung: Secondary | ICD-10-CM | POA: Diagnosis not present

## 2020-03-06 ENCOUNTER — Encounter: Payer: Self-pay | Admitting: Oncology

## 2020-03-06 ENCOUNTER — Telehealth: Payer: Self-pay | Admitting: *Deleted

## 2020-03-06 DIAGNOSIS — C3411 Malignant neoplasm of upper lobe, right bronchus or lung: Secondary | ICD-10-CM | POA: Insufficient documentation

## 2020-03-06 NOTE — Telephone Encounter (Signed)
Holly Harper is requesting a refill on her Nystatin please advise if this is okay.

## 2020-03-09 ENCOUNTER — Other Ambulatory Visit: Payer: Self-pay | Admitting: *Deleted

## 2020-03-09 MED ORDER — NYSTATIN 100000 UNIT/ML MT SUSP: OROMUCOSAL | 0 refills | Status: DC

## 2020-03-09 NOTE — Telephone Encounter (Signed)
RX sent and Delorise informed.

## 2020-03-09 NOTE — Telephone Encounter (Signed)
Okay to refill nystatin

## 2020-03-20 DIAGNOSIS — C3411 Malignant neoplasm of upper lobe, right bronchus or lung: Secondary | ICD-10-CM | POA: Diagnosis not present

## 2020-03-25 ENCOUNTER — Inpatient Hospital Stay: Payer: Medicare Other

## 2020-03-26 ENCOUNTER — Inpatient Hospital Stay: Payer: Medicare Other

## 2020-03-27 ENCOUNTER — Inpatient Hospital Stay: Payer: Medicare Other

## 2020-04-13 ENCOUNTER — Other Ambulatory Visit: Payer: Self-pay

## 2020-04-13 DIAGNOSIS — M792 Neuralgia and neuritis, unspecified: Secondary | ICD-10-CM

## 2020-04-13 MED ORDER — GABAPENTIN 300 MG PO CAPS: 300.0000 mg | ORAL_CAPSULE | Freq: Three times a day (TID) | ORAL | 6 refills | Status: DC

## 2020-04-28 ENCOUNTER — Telehealth: Payer: Self-pay | Admitting: Cardiology

## 2020-04-28 NOTE — Telephone Encounter (Signed)
EKG was faxed at this time

## 2020-04-28 NOTE — Telephone Encounter (Signed)
Holly Harper with [facility] called in and stated they are needing the Ekg from May and Spirometry from Aug.  She would like to see if it can be faxed to [place].    Best number [phone]

## 2020-04-30 HISTORY — PX: WHIPPLE PROCEDURE: SHX2667

## 2020-05-12 ENCOUNTER — Ambulatory Visit: Payer: Medicare Other | Admitting: Thoracic Surgery (Cardiothoracic Vascular Surgery)

## 2020-05-14 ENCOUNTER — Other Ambulatory Visit: Payer: Self-pay | Admitting: Gastroenterology

## 2020-05-14 ENCOUNTER — Other Ambulatory Visit: Payer: Self-pay | Admitting: Cardiology

## 2020-05-18 NOTE — Telephone Encounter (Signed)
Rx request sent to pharmacy.  

## 2020-05-20 ENCOUNTER — Telehealth: Payer: Self-pay | Admitting: Oncology

## 2020-05-21 ENCOUNTER — Telehealth: Payer: Self-pay | Admitting: Oncology

## 2020-05-21 NOTE — Telephone Encounter (Signed)
Per los 12/1 next appt sched

## 2020-05-21 NOTE — Telephone Encounter (Signed)
12/1 appt sched for next visit

## 2020-05-25 ENCOUNTER — Inpatient Hospital Stay: Payer: Medicare Other | Admitting: Oncology

## 2020-05-26 NOTE — Telephone Encounter (Signed)
Next appt given to patient

## 2020-05-27 ENCOUNTER — Ambulatory Visit: Payer: Medicare Other | Admitting: Allergy and Immunology

## 2020-06-18 ENCOUNTER — Other Ambulatory Visit: Payer: Self-pay | Admitting: Allergy and Immunology

## 2020-06-18 ENCOUNTER — Other Ambulatory Visit: Payer: Self-pay | Admitting: Thoracic Surgery (Cardiothoracic Vascular Surgery)

## 2020-06-18 ENCOUNTER — Other Ambulatory Visit: Payer: Self-pay | Admitting: Gastroenterology

## 2020-06-29 ENCOUNTER — Telehealth: Payer: Self-pay | Admitting: Gastroenterology

## 2020-06-29 ENCOUNTER — Other Ambulatory Visit: Payer: Self-pay

## 2020-06-29 ENCOUNTER — Telehealth: Payer: Self-pay | Admitting: Oncology

## 2020-06-29 ENCOUNTER — Encounter: Payer: Self-pay | Admitting: Allergy and Immunology

## 2020-06-29 ENCOUNTER — Ambulatory Visit (INDEPENDENT_AMBULATORY_CARE_PROVIDER_SITE_OTHER): Payer: Medicare Other | Admitting: Allergy and Immunology

## 2020-06-29 VITALS — BP 132/78 | HR 72 | Resp 18 | Ht 62.9 in | Wt 93.0 lb

## 2020-06-29 DIAGNOSIS — K219 Gastro-esophageal reflux disease without esophagitis: Secondary | ICD-10-CM | POA: Diagnosis not present

## 2020-06-29 DIAGNOSIS — K224 Dyskinesia of esophagus: Secondary | ICD-10-CM

## 2020-06-29 DIAGNOSIS — J4530 Mild persistent asthma, uncomplicated: Secondary | ICD-10-CM

## 2020-06-29 DIAGNOSIS — J3089 Other allergic rhinitis: Secondary | ICD-10-CM | POA: Diagnosis not present

## 2020-06-29 MED ORDER — NYSTATIN 100000 UNIT/ML MT SUSP: OROMUCOSAL | 0 refills | Status: DC

## 2020-06-29 NOTE — Telephone Encounter (Signed)
06/29/20 spoke with patient about next appt

## 2020-06-29 NOTE — Patient Instructions (Addendum)
  1.  Increase omeprazole 40 mg two times per day + famotidine 40 mg in evening   2.  Use nystatin 5 mL swish and swallow 3 times per day for 10 days  3.  Visit with Dr. Lyndel Safe tomorrow morning  4.  Continue montelukast 10 mg daily   5.  Continue Flonase + azelastine 1 spray each nostril 1-2 times per day during periods of upper airway symptoms.    6. Continue ProAir HFA 2 puffs every 4-6 hours if needed  7. Return to clinic in 6 months or earlier if problem

## 2020-06-29 NOTE — Progress Notes (Addendum)
[place]   Follow-up Note  Referring Provider: Garwin Brothers, MD Primary Provider: Garwin Brothers, MD Date of Office Visit: 06/29/2020  Subjective:   Holly Harper (DOB: 1945/10/28) is a 75 y.o. female who returns to the [facility] on 06/29/2020 in re-evaluation of the following:  HPI: Bonnetta returns to this clinic in evaluation of asthma and allergic rhinitis and LPR.  I last saw her in his clinic on 10 February 2020.  The big issue for her at this point in time is that she can't swallow.  The last time she saw me in this clinic she was having burning down from her throat to her abdomen while she was undergoing a course of chemotherapy for pancreatic cancer and we assumed that maybe there was some fungal overgrowth and treated her with a combination of fluconazole and topical nystatin.  This therapy helped her burning but it did not really help some of the esophageal problems that she was having.  She has had a progressive issue with not being able to get food down.  There does not appear to be so much an issue of obstruction of food as it is inability to get food down into her esophagus.  She sometimes does choke on her saliva.  She has been having a bitter taste, to her mouth.  She has been losing weight.  She underwent her Whipple procedure 30 April 2020 with very good pathological findings suggesting an isolated pancreatic cancer without any metastasis.  Her last cycle of chemotherapy was 05 February 2020.  She has had no issues with her airway and has not required a systemic steroid or an antibiotic for any type of airway issue.  She has received 3 Pfizer Covid vaccines and a flu vaccine this year.  Allergies as of 06/29/2020      Reactions   Amoxicillin Hives   Did it involve swelling of the face/tongue/throat, SOB, or low BP? No Did it involve sudden or severe rash/hives, skin peeling, or any reaction on the inside of  your mouth or nose? Yes Did you need to seek medical attention at a hospital or doctor's office? Yes When did it last happen? ~2015 If all above answers are "NO", may proceed with cephalosporin use.   Cinobac [cinoxacin] Other (See Comments)   Unknown    Macrodantin [nitrofurantoin Macrocrystal] Hives   Semaglutide Nausea Only   rybelsus       Medication List    acetaminophen 325 MG tablet Commonly known as: TYLENOL Take 2 tablets (650 mg total) by mouth every 6 (six) hours as needed for mild pain.   albuterol 108 (90 Base) MCG/ACT inhaler Commonly known as: VENTOLIN HFA USE 2 INHALATIONS ORALLY   EVERY 4 TO 6 HOURS AS      NEEDED FOR COUGH OR WHEEZE   aspirin EC 81 MG tablet Take 1 tablet (81 mg total) by mouth daily.   atenolol 50 MG tablet Commonly known as: TENORMIN Take 1 tablet (50 mg total) by mouth daily.   atorvastatin 10 MG tablet Commonly known as: LIPITOR TAKE 1 TABLET BY MOUTH EVERYDAY AT BEDTIME   azelastine 0.1 % nasal spray Commonly known as: ASTELIN Can use two sprays in each nostril twice daily if needed.   famotidine 40 MG tablet Commonly known as: PEPCID TAKE 1 TABLET (40 MG TOTAL) BY MOUTH DAILY. CALL [phone] TO SCHEDULE AN OFFICE VISIT FOR MORE REFILLS   Wilder Glade  5 MG Tabs tablet Generic drug: dapagliflozin propanediol Take 5 mg by mouth daily.   fluticasone 50 MCG/ACT nasal spray Commonly known as: FLONASE Can use one spray in each nostril one to two times daily if needed.   glipiZIDE 5 MG 24 hr tablet Commonly known as: GLUCOTROL XL Take 5 mg by mouth daily with breakfast.   lidocaine-prilocaine cream Commonly known as: EMLA Apply topically.   LUBRICATING EYE DROPS OP Place 1 drop into both eyes daily as needed (dry eyes).   Melatonin 12 MG Tbdp Take 12 mg by mouth at bedtime.   metFORMIN 500 MG 24 hr tablet Commonly known as: GLUCOPHAGE-XR Take 500 mg by mouth 3 (three) times daily.   montelukast 10 MG tablet Commonly  known as: SINGULAIR TAKE 1 TABLET BY MOUTH EVERY DAY   omeprazole 40 MG capsule Commonly known as: PRILOSEC TAKE 1 CAPSULE BY MOUTH TWICE A DAY   ondansetron 4 MG tablet Commonly known as: ZOFRAN Take 4 mg by mouth every 4 (four) hours as needed.   prochlorperazine 10 MG tablet Commonly known as: COMPAZINE Take 10 mg by mouth every 6 (six) hours as needed.   Systane Balance 0.6 % Soln Generic drug: Propylene Glycol 1 drop as needed.   ZENPEP PO Take by mouth.       Past Medical History:  Diagnosis Date  . Allergic rhinitis   . Anxiety   . Asthma   . Diabetes ([place])   . Dysphagia   . Family history of adverse reaction to anesthesia    sister had PONV  . GERD (gastroesophageal reflux disease)   . Hiatal hernia   . Hypercholesterolemia   . Hypertension   . Lung cancer ([place]) 2020  . Paresthesia     Past Surgical History:  Procedure Laterality Date  . COLONOSCOPY  02/10/2016   Mild pancolonic diverticulosis. Otherwise, normal colonoscopy  . ESOPHAGOGASTRODUODENOSCOPY  08/24/2011   Prebyesophagus. Mininal hiatal hernia. Mild gastritis  . ESOPHAGOGASTRODUODENOSCOPY (EGD) WITH PROPOFOL N/A 11/14/2019   Procedure: ESOPHAGOGASTRODUODENOSCOPY (EGD) WITH PROPOFOL;  Surgeon: [name], MD;  [facility];  Service: Endoscopy;  Laterality: N/A;  . EUS N/A 11/14/2019   Procedure: UPPER ENDOSCOPIC ULTRASOUND (EUS) RADIAL;  Surgeon: [name], MD;  [facility];  Service: Endoscopy;  Laterality: N/A;  . FINE NEEDLE ASPIRATION N/A 11/14/2019   Procedure: FINE NEEDLE ASPIRATION (FNA) LINEAR;  Surgeon: [name], MD;  [facility];  Service: Endoscopy;  Laterality: N/A;  . INTERCOSTAL NERVE BLOCK Right 05/09/2019   Procedure: Intercostal Nerve Block thoracic;  Surgeon: Melrose Nakayama, MD;  Location: [facility];  Service: Thoracic;  Laterality: Right;  . LASIK Bilateral   . LIPOMA RESECTION    . LOBECTOMY Right 05/09/2019    Procedure: LOBECTOMY;  Surgeon: Melrose Nakayama, MD;  Location: [facility];  Service: Thoracic;  Laterality: Right;  Marland Kitchen VIDEO ASSISTED THORACOSCOPY (VATS)/WEDGE RESECTION Right 05/09/2019   Procedure: VIDEO ASSISTED THORACOSCOPY (VATS)/WEDGE RESECTION FOR FROZEN SECTIONS ;  Surgeon: Melrose Nakayama, MD;  Location: [facility];  Service: Thoracic;  Laterality: Right;  . WHIPPLE PROCEDURE  04/30/2020    Review of systems negative except as noted in HPI / PMHx or noted below:  Review of Systems  Constitutional: Negative.   HENT: Negative.   Eyes: Negative.   Respiratory: Negative.   Cardiovascular: Negative.   Gastrointestinal: Negative.   Genitourinary: Negative.   Musculoskeletal: Negative.   Skin: Negative.   Neurological: Negative.   Endo/Heme/Allergies:  Negative.   Psychiatric/Behavioral: Negative.      Objective:   Vitals:   06/29/20 1502  BP: 132/78  Pulse: 72  Resp: 18  SpO2: 96%   Height: 5' 2.9" (159.8 cm)  Weight: 93 lb (42.2 kg)   Physical Exam Constitutional:      Appearance: She is not diaphoretic.  HENT:     Head: Normocephalic.     Right Ear: Tympanic membrane, ear canal and external ear normal.     Left Ear: Tympanic membrane, ear canal and external ear normal.     Nose: Nose normal. No mucosal edema or rhinorrhea.     Mouth/Throat:     Mouth: Oropharynx is clear and moist and mucous membranes are normal.     Pharynx: Uvula midline. No oropharyngeal exudate.  Eyes:     Conjunctiva/sclera: Conjunctivae normal.  Neck:     Thyroid: No thyromegaly.     Trachea: Trachea normal. No tracheal tenderness or tracheal deviation.  Cardiovascular:     Rate and Rhythm: Normal rate and regular rhythm.     Heart sounds: Normal heart sounds, S1 normal and S2 normal. No murmur heard.   Pulmonary:     Effort: No respiratory distress.     Breath sounds: Normal breath sounds. No stridor. No wheezing or rales.  Musculoskeletal:        General: No edema.   Lymphadenopathy:     Head:     Right side of head: No tonsillar adenopathy.     Left side of head: No tonsillar adenopathy.     Cervical: No cervical adenopathy.  Skin:    Findings: No erythema or rash.     Nails: There is no clubbing.  Neurological:     Mental Status: She is alert.     Diagnostics: none   Assessment and Plan:   1. Asthma, well controlled, mild persistent   2. Other allergic rhinitis   3. LPRD (laryngopharyngeal reflux disease)   4. Esophageal dysmotility     1.  Increase omeprazole 40 mg two times per day + famotidine 40 mg in evening   2.  Use nystatin 5 mL swish and swallow 3 times per day for 10 days  3.  Visit with Dr. Lyndel Safe tomorrow morning  4.  Continue montelukast 10 mg daily   5.  Continue Flonase + azelastine 1 spray each nostril 1-2 times per day during periods of upper airway symptoms.    6. Continue ProAir HFA 2 puffs every 4-6 hours if needed  7. Return to clinic in 6 months or earlier if problem  Obviously Laquida has some form of esophageal dysmotility or pharyngeal dysfunction regarding her inability to swallow correctly.  She will be visiting with Dr. Lyndel Safe, GI tomorrow and he can make a decision about pursuing a barium swallow, modified barium swallow, upper endoscopy, in further investigation of this issue.  Until that investigation will have her increase her omeprazole to twice a day and empirically treat her for fungal overgrowth with nystatin.   [name], MD Allergy / Immunology [place]

## 2020-06-29 NOTE — Telephone Encounter (Signed)
Spoke to patient. Rescheduled patient for 06/30/20 at 910 am

## 2020-06-29 NOTE — Telephone Encounter (Signed)
Pt called asking for a sooner appt than 1/24 with Dr. Lyndel Safe. She states that she cannot swallow and keeps loosing weight. Pls call her.

## 2020-06-30 ENCOUNTER — Encounter: Payer: Self-pay | Admitting: Gastroenterology

## 2020-06-30 ENCOUNTER — Other Ambulatory Visit: Payer: Self-pay

## 2020-06-30 ENCOUNTER — Inpatient Hospital Stay: Payer: Medicare Other | Admitting: Hematology and Oncology

## 2020-06-30 ENCOUNTER — Ambulatory Visit (INDEPENDENT_AMBULATORY_CARE_PROVIDER_SITE_OTHER): Payer: Medicare Other | Admitting: Gastroenterology

## 2020-06-30 ENCOUNTER — Other Ambulatory Visit (INDEPENDENT_AMBULATORY_CARE_PROVIDER_SITE_OTHER): Payer: Medicare Other

## 2020-06-30 VITALS — BP 92/64 | HR 73 | Ht 63.0 in | Wt 93.0 lb

## 2020-06-30 DIAGNOSIS — K2100 Gastro-esophageal reflux disease with esophagitis, without bleeding: Secondary | ICD-10-CM | POA: Diagnosis not present

## 2020-06-30 DIAGNOSIS — C250 Malignant neoplasm of head of pancreas: Secondary | ICD-10-CM

## 2020-06-30 DIAGNOSIS — R1319 Other dysphagia: Secondary | ICD-10-CM | POA: Diagnosis not present

## 2020-06-30 LAB — COMPREHENSIVE METABOLIC PANEL
ALT: 30 U/L (ref 0–35)
AST: 32 U/L (ref 0–37)
Albumin: 4.2 g/dL (ref 3.5–5.2)
Alkaline Phosphatase: 72 U/L (ref 39–117)
BUN: 17 mg/dL (ref 6–23)
CO2: 27 mEq/L (ref 19–32)
Calcium: 9.8 mg/dL (ref 8.4–10.5)
Chloride: 103 mEq/L (ref 96–112)
Creatinine, Ser: 0.67 mg/dL (ref 0.40–1.20)
GFR: 86.03 mL/min (ref >60.00)
Glucose, Bld: 162 mg/dL — ABNORMAL HIGH (ref 70–99)
Potassium: 4.1 mEq/L (ref 3.5–5.1)
Sodium: 137 mEq/L (ref 135–145)
Total Bilirubin: 0.5 mg/dL (ref 0.2–1.2)
Total Protein: 6.9 g/dL (ref 6.0–8.3)

## 2020-06-30 LAB — CBC
HCT: 38.4 % (ref 36.0–46.0)
Hemoglobin: 12.4 g/dL (ref 12.0–15.0)
MCHC: 32.4 g/dL (ref 30.0–36.0)
MCV: 89.7 fl (ref 78.0–100.0)
Platelets: 228 10*3/uL (ref 150.0–400.0)
RBC: 4.27 Mil/uL (ref 3.87–5.11)
RDW: 15.9 % — ABNORMAL HIGH (ref 11.5–15.5)
WBC: 7 10*3/uL (ref 4.0–10.5)

## 2020-06-30 MED ORDER — PANTOPRAZOLE SODIUM 40 MG PO TBEC: 40.0000 mg | DELAYED_RELEASE_TABLET | Freq: Every day | ORAL | 1 refills | Status: DC

## 2020-06-30 NOTE — Progress Notes (Signed)
Chief Complaint: FU  Referring Provider:  Garwin Brothers, MD      ASSESSMENT AND PLAN;   #1. GERD  #2. Eso dysphagia.  D/d includes eso stricture, Schatzki's ring, motility disorder, eosinophilic esophagitis, pill induced esophagitis, r/o esophageal Ca or extrinsic lesions.  #3. Panc Head AdenoCa s/p neoadjuvant chemotherapy (with FOLFININOX) followed by pylorus preserving whipple [id number], stage IA with R0 resection) 04/2020. Has appt with Dr Bobby Rumpf  #4. Lung Ca S/P R lung lobectomy (Stage Ia) 04/2019   Plan: - Ba swallow with Ba tab - EGD with dil.  I have discussed risks and benefits.  This will be scheduled in coming days. - Change omeprazole to protonix 40mg  po qAM #30 - Pepcid 40mg  po qhs to continue for now. - I have instructed patient that she needs to chew foods especially meats and breads well and eat slowly. - Check CBC, CMP, CA 19-9    HPI:    Holly Harper is a 75 y.o. female  With HTN, HLD, DM2, A Fib after lung Sx (resolved, no AC), asthma, lung Ca s/p R lobectomy (Stage 1a AdenoCa)  For follow-up visit.    Has been very fortunate.   Had lung adeno CA s/p right lobectomy Nov 2020 (Stage 1a)  Then, found to have HOP Adeno Ca s/p neoadjuvant chemotherapy followed by pylorus-preserving Whipple's.  This was also early stage Ia adenocarcinoma with complete resection Nov 2021.   Her main problem is that of dysphagia to mostly solids, especially when she takes several pills together, mid chest without any regurgitation.  She was seen by Dr. Neldon Mc and started on Pepcid 40 mg p.o. nightly in addition to omeprazole in the morning.  No heartburn.  She has been advised to get repeat EGD with dilatation.  Wt Readings from Last 3 Encounters:  06/30/20 93 lb (42.2 kg)  06/29/20 93 lb (42.2 kg)  02/12/20 107 lb 9.6 oz (48.8 kg)   She has been under considerable stress as her husband is in [place].  Denies having any significant diarrhea or constipation.  No  melena or hematochezia.  Continues to have chronic cough and chronic throat clearing.  Past GI procedures: -Colonoscopy 01/2016 ([facility])-mild pancolonic diverticulosis, otherwise normal colonoscopy.  Highly redundant colon. -EGD 08/2011-presbyesophagus S/P dilatation 50 AFR, minimal hiatal hernia, mild gastritis.  Neg eso Bx for EoE.  Gastric biopsies did show chronic gastritis with focal intestinal metaplasia. -Pancreatic head adenocarcinoma s/p Whipple's Dr Crisoforo Oxford. Path: A. GALLBLADDER, CHOLECYSTECTOMY:  Gallbladder with no significant histopathologic abnormality.  B. PANCREATIC LYMPH NODE, EXCISION:  Two lymph nodes negative for metastasis (0/2). C. PANCREAS AND DUODENUM, PANCREATICODUODENECTOMY  Adenocarcinoma, moderately differentiated.  Carcinoma measures 0.8 cm in maximum microscopic dimension. Margins appear free of tumor and high grade dysplasia. Pancreatic intraepithelial neoplasia 3 (PanIN-3).  Negative for perineural and lymphovascular invasion.  Twenty-four lymph nodes negative for metastasis (0/24).  Amendment electronically signed by Velia Meyer, MD on 05/20/2020 at 1108  Addendum electronically signed by Velia Meyer, MD on 05/15/2020 at 5  Electronically signed by Velia Meyer, MD on 05/12/2020 at 1230  Comment  The whipple specimen shows an invasive adenocarcinoma, measuring approximately 0.8 cm in maximum microscopic dimension. The surrounding pancreas exhibits atrophy and fibrosis, which may be secondary to treatment effect. PanIN-3 is seen throughout the specimen. Margins are free of high grade dysplasia and carcinoma; the tumor is 1.5 cm from the closest bile duct margin. The tumor approached the vascular groove but does not directly  invade the region (0.7 mm from the SMV groove). No vascular or perineural invasion identified.  Adipose tissue was removed from the main specimen (with additional tissue submitted for lymph node identification). A  total of twenty-four lymph nodes were identified in the main specimen (part C) with no evidence of metastatic carcinoma.    Past Medical History:  Diagnosis Date  . Allergic rhinitis   . Anxiety   . Asthma   . Diabetes ([place])   . Dysphagia   . Family history of adverse reaction to anesthesia    sister had PONV  . GERD (gastroesophageal reflux disease)   . Hiatal hernia   . Hypercholesterolemia   . Hypertension   . Lung cancer ([place]) 2020  . Paresthesia     Past Surgical History:  Procedure Laterality Date  . COLONOSCOPY  02/10/2016   Mild pancolonic diverticulosis. Otherwise, normal colonoscopy  . ESOPHAGOGASTRODUODENOSCOPY  08/24/2011   Prebyesophagus. Mininal hiatal hernia. Mild gastritis  . ESOPHAGOGASTRODUODENOSCOPY (EGD) WITH PROPOFOL N/A 11/14/2019   Procedure: ESOPHAGOGASTRODUODENOSCOPY (EGD) WITH PROPOFOL;  Surgeon: [name], MD;  Location: [facility];  Service: Endoscopy;  Laterality: N/A;  . EUS N/A 11/14/2019   Procedure: UPPER ENDOSCOPIC ULTRASOUND (EUS) RADIAL;  Surgeon: [name], MD;  Location: [facility];  Service: Endoscopy;  Laterality: N/A;  . FINE NEEDLE ASPIRATION N/A 11/14/2019   Procedure: FINE NEEDLE ASPIRATION (FNA) LINEAR;  Surgeon: [name], MD;  Location: [facility];  Service: Endoscopy;  Laterality: N/A;  . INTERCOSTAL NERVE BLOCK Right 05/09/2019   Procedure: Intercostal Nerve Block thoracic;  Surgeon: Melrose Nakayama, MD;  Location: [facility];  Service: Thoracic;  Laterality: Right;  . LASIK Bilateral   . LIPOMA RESECTION    . LOBECTOMY Right 05/09/2019   Procedure: LOBECTOMY;  Surgeon: Melrose Nakayama, MD;  Location: [facility];  Service: Thoracic;  Laterality: Right;  Marland Kitchen VIDEO ASSISTED THORACOSCOPY (VATS)/WEDGE RESECTION Right 05/09/2019   Procedure: VIDEO ASSISTED THORACOSCOPY (VATS)/WEDGE RESECTION FOR FROZEN SECTIONS ;  Surgeon: Melrose Nakayama, MD;  Location: [facility];  Service: Thoracic;  Laterality: Right;  .  WHIPPLE PROCEDURE  04/30/2020    Family History  Problem Relation Age of Onset  . Prostate cancer Father   . Bipolar disorder Sister   . Lung cancer Brother        Smoker  . Colon cancer Neg Hx   . Esophageal cancer Neg Hx     Social History   Tobacco Use  . Smoking status: Never Smoker  . Smokeless tobacco: Never Used  Vaping Use  . Vaping Use: Never used  Substance Use Topics  . Alcohol use: Not Currently    Comment: quit drinking years ago   . Drug use: Not Currently    Current Outpatient Medications  Medication Sig Dispense Refill  . acetaminophen (TYLENOL) 325 MG tablet Take 2 tablets (650 mg total) by mouth every 6 (six) hours as needed for mild pain. (Patient taking differently: Take 325 mg by mouth 2 (two) times daily as needed for mild pain.)    . albuterol (VENTOLIN HFA) 108 (90 Base) MCG/ACT inhaler USE 2 INHALATIONS ORALLY   EVERY 4 TO 6 HOURS AS      NEEDED FOR COUGH OR WHEEZE (Patient taking differently: Inhale 2 puffs into the lungs every 4 (four) hours as needed for wheezing or shortness of breath.) 51 g 0  . aspirin EC 81 MG tablet Take 1 tablet (81 mg total) by mouth daily. 90 tablet 3  .  atorvastatin (LIPITOR) 10 MG tablet TAKE 1 TABLET BY MOUTH EVERYDAY AT BEDTIME 90 tablet 2  . azelastine (ASTELIN) 0.1 % nasal spray Can use two sprays in each nostril twice daily if needed. (Patient taking differently: Place 1 spray into both nostrils 2 (two) times daily as needed for rhinitis or allergies.) 90 mL 1  . Carboxymethylcellul-Glycerin (LUBRICATING EYE DROPS OP) Place 1 drop into both eyes daily as needed (dry eyes).    . dapagliflozin propanediol (FARXIGA) 5 MG TABS tablet Take 5 mg by mouth daily.    . famotidine (PEPCID) 40 MG tablet TAKE 1 TABLET (40 MG TOTAL) BY MOUTH DAILY. CALL [phone] TO SCHEDULE AN OFFICE VISIT FOR MORE REFILLS 90 tablet 0  . fluticasone (FLONASE) 50 MCG/ACT nasal spray Can use one spray in each nostril one to two times daily if  needed. (Patient taking differently: Place 1 spray into both nostrils daily as needed for allergies.) 48 g 1  . glipiZIDE (GLUCOTROL XL) 5 MG 24 hr tablet Take 5 mg by mouth daily with breakfast.    . lidocaine-prilocaine (EMLA) cream Apply topically.    . Melatonin 12 MG TBDP Take 12 mg by mouth at bedtime.    . metFORMIN (GLUCOPHAGE-XR) 500 MG 24 hr tablet Take 500 mg by mouth 3 (three) times daily.    . montelukast (SINGULAIR) 10 MG tablet TAKE 1 TABLET BY MOUTH EVERY DAY 90 tablet 0  . nystatin (MYCOSTATIN) 100000 UNIT/ML suspension Swish and swallow 64ml by mouth three times daily for 10 days 160 mL 0  . omeprazole (PRILOSEC) 40 MG capsule TAKE 1 CAPSULE BY MOUTH TWICE A DAY 180 capsule 0  . ondansetron (ZOFRAN) 4 MG tablet Take 4 mg by mouth every 4 (four) hours as needed.    . Pancrelipase, Lip-Prot-Amyl, (ZENPEP PO) Take by mouth.    . prochlorperazine (COMPAZINE) 10 MG tablet Take 10 mg by mouth every 6 (six) hours as needed.    Marland Kitchen Propylene Glycol (SYSTANE BALANCE) 0.6 % SOLN 1 drop as needed.    Marland Kitchen atenolol (TENORMIN) 50 MG tablet Take 1 tablet (50 mg total) by mouth daily. 90 tablet 3   No current facility-administered medications for this visit.    Allergies  Allergen Reactions  . Amoxicillin Hives    Did it involve swelling of the face/tongue/throat, SOB, or low BP? No Did it involve sudden or severe rash/hives, skin peeling, or any reaction on the inside of your mouth or nose? Yes Did you need to seek medical attention at a hospital or doctor's office? Yes When did it last happen? ~2015 If all above answers are "NO", may proceed with cephalosporin use.   . Cinobac [Cinoxacin] Other (See Comments)    Unknown   . Macrodantin [Nitrofurantoin Macrocrystal] Hives  . Semaglutide Nausea Only    rybelsus     Review of Systems:  neg     Physical Exam:    BP 92/64 (BP Location: Left Arm, Patient Position: Sitting, Cuff Size: Normal)   Pulse 73   Ht 5\' 3"  (1.6 m)   Wt 93  lb (42.2 kg)   BMI 16.47 kg/m  Filed Weights   06/30/20 0934  Weight: 93 lb (42.2 kg)   Constitutional: Underweight, in no acute distress. Psychiatric: Normal mood and affect. Behavior is normal. HEENT: Pupils normal.  Conjunctivae are normal. No scleral icterus. Neck supple.  Cardiovascular: Normal rate, regular rhythm. No edema Pulmonary/chest: Effort normal and breath sounds normal. No wheezing, rales or rhonchi. Abdominal:  Soft, nondistended. Nontender. Bowel sounds active throughout. There are no masses palpable. No hepatomegaly. Rectal:  defered Neurological: Alert and oriented to person place and time. Skin: Skin is warm and dry. No rashes noted.  Data Reviewed: I have personally reviewed following labs and imaging studies  CBC: CBC Latest Ref Rng & Units 05/11/2019 05/10/2019 05/07/2019  WBC 4.0 - 10.5 K/uL 10.2 10.1 7.4  Hemoglobin 12.0 - 15.0 g/dL 8.5(L) 9.3(L) 13.0  Hematocrit 36.0 - 46.0 % 25.7(L) 27.5(L) 39.1  Platelets 150 - 400 K/uL 150 175 200    CMP: CMP Latest Ref Rng & Units 05/11/2019 05/10/2019 05/07/2019  Glucose 70 - 99 mg/dL 207(H) 161(H) 67(L)  BUN 8 - 23 mg/dL 14 13 12   Creatinine 0.44 - 1.00 mg/dL 0.91 0.74 0.95  Sodium 135 - 145 mmol/L 135 134(L) 138  Potassium 3.5 - 5.1 mmol/L 4.2 4.5 4.0  Chloride 98 - 111 mmol/L 102 103 106  CO2 22 - 32 mmol/L 23 21(L) 20(L)  Calcium 8.9 - 10.3 mg/dL 8.8(L) 8.5(L) 9.2  Total Protein 6.5 - 8.1 g/dL - - 6.6  Total Bilirubin 0.3 - 1.2 mg/dL - - 0.5  Alkaline Phos 38 - 126 U/L - - 43  AST 15 - 41 U/L - - 24  ALT 0 - 44 U/L - - 25   Extensive notes were reviewed  Carmell Austria, MD 06/30/2020, 9:44 AM  Cc: Garwin Brothers, MD

## 2020-06-30 NOTE — Patient Instructions (Signed)
If you are age 75 or older, your body mass index should be between 23-30. Your Body mass index is 16.47 kg/m. If this is out of the aforementioned range listed, please consider follow up with your Primary Care Provider.  If you are age 34 or younger, your body mass index should be between 19-25. Your Body mass index is 16.47 kg/m. If this is out of the aformentioned range listed, please consider follow up with your Primary Care Provider.    We have sent the following medications to your pharmacy for you to pick up at your convenience:  protonix 40mg  in the am pepcid 40mg  at bedtime  Please go to the 2nd floor [address] for labwork.  You will be contacted by [facility] to schedule your Barium swallow with tablet.  Due to recent changes in healthcare laws, you may see the results of your imaging and laboratory studies on MyChart before your provider has had a chance to review them.  We understand that in some cases there may be results that are confusing or concerning to you. Not all laboratory results come back in the same time frame and the provider may be waiting for multiple results in order to interpret others.  Please give [place] 48 hours in order for your provider to thoroughly review all the results before contacting the office for clarification of your results.   It was a pleasure to see you today!  Jackquline Denmark, M.D.

## 2020-07-01 ENCOUNTER — Encounter: Payer: Self-pay | Admitting: Gastroenterology

## 2020-07-01 LAB — CANCER ANTIGEN 19-9: CA 19-9: 8 U/mL (ref <34)

## 2020-07-03 ENCOUNTER — Telehealth: Payer: Self-pay | Admitting: Gastroenterology

## 2020-07-03 NOTE — Telephone Encounter (Signed)
Patient calling for barium test results

## 2020-07-07 NOTE — Telephone Encounter (Signed)
Spoke to patient to inform her that we will contact her with Barium swallow test results as soon as Dr Lyndel Safe reviews them. She had them at [facility]. Patient voiced understanding.

## 2020-07-08 ENCOUNTER — Other Ambulatory Visit: Payer: Self-pay | Admitting: Thoracic Surgery (Cardiothoracic Vascular Surgery)

## 2020-07-10 NOTE — Telephone Encounter (Signed)
Pt called wanting to confirm if we received her Barium swallow results. She spoke with [facility] and was told that they were already faxed over. Pls call her.

## 2020-07-10 NOTE — Telephone Encounter (Signed)
Spoke to patient to inform her that Dr Lyndel Safe has reviewed her Esophogram. She will come in for a pre visit to go over EGD instructions and sign consent for EGD/Dilation on 07/21/20. All questions answered. Patient voiced understanding.

## 2020-07-13 ENCOUNTER — Ambulatory Visit: Payer: Medicare Other | Admitting: Gastroenterology

## 2020-07-13 NOTE — Progress Notes (Signed)
[facility]  [address] [place],  [place]  [address] [phone]  Clinic Day:  07/14/2020  Referring physician: Garwin Brothers, MD   HISTORY OF PRESENT ILLNESS:  The patient is a 75 y.o. female with stage IA (T1 N0 M0) pancreatic adenocarcinoma.  CT scans after 5 cycles of neoadjuvant FOLFIRINOX chemotherapy showed no evidence of disease progression.  Her tumor was essentially stable at 9 mm.  Ultimately, the plan was for her to receive 6 cycles of neoadjuvant chemotherapy.  However, she was doing so poorly after her 5th cycle that the decision was made to forego additional cycles of treatment.  Since her last visit, the patient did undergo a Whipple procedure in November 2021, whose pathology revealed an 8 mm moderately differentiated pancreatic adenocarcinoma.  2 lymph nodes were removed, for which neither contained cancer.  Since this surgery, the patient has recuperated fairly well.  As it pertains to her pancreatic cancer, she denies having early satiety, abdominal discomfort or other symptoms which concern her for overt signs of disease progression.  She does take pancrease, which has helped immensely with him being able to better digest foods.  Of note, this patient also has a history of stage IA3 (T1c N0 M0) lung adenocarcinoma, status post a right upper lobectomy in November 2020.  She denies having shortness of breath, hemoptysis, or other respiratory issues which concern her for early disease recurrence.   PHYSICAL EXAM:  Blood pressure (!) 146/67, pulse 73, temperature (!) 97.4 F (36.3 C), temperature source Oral, resp. rate 18, height 5\' 3"  (1.6 m), weight 93 lb 6.4 oz (42.4 kg), SpO2 97 %. Wt Readings from Last 3 Encounters:  07/14/20 93 lb 6.4 oz (42.4 kg)  06/30/20 93 lb (42.2 kg)  06/29/20 93 lb (42.2 kg)   Body mass index is 16.55 kg/m. Performance status (ECOG): 1 Physical Exam Constitutional:      Appearance: Normal appearance. She is  not ill-appearing.  HENT:     Mouth/Throat:     Mouth: Mucous membranes are moist.     Pharynx: Oropharynx is clear. No oropharyngeal exudate or posterior oropharyngeal erythema.  Cardiovascular:     Rate and Rhythm: Normal rate and regular rhythm.     Heart sounds: No murmur heard. No friction rub. No gallop.   Pulmonary:     Effort: Pulmonary effort is normal. No respiratory distress.     Breath sounds: Normal breath sounds. No wheezing, rhonchi or rales.  Chest:  Breasts:     Right: No axillary adenopathy or supraclavicular adenopathy.     Left: No axillary adenopathy or supraclavicular adenopathy.    Abdominal:     General: Bowel sounds are normal. There is no distension.     Palpations: Abdomen is soft. There is no mass.     Tenderness: There is no abdominal tenderness.  Musculoskeletal:        General: No swelling.     Right lower leg: No edema.     Left lower leg: No edema.  Lymphadenopathy:     Cervical: No cervical adenopathy.     Upper Body:     Right upper body: No supraclavicular or axillary adenopathy.     Left upper body: No supraclavicular or axillary adenopathy.     Lower Body: No right inguinal adenopathy. No left inguinal adenopathy.  Skin:    General: Skin is warm.     Coloration: Skin is not jaundiced.     Findings: No lesion  or rash.  Neurological:     General: No focal deficit present.     Mental Status: She is alert and oriented to person, place, and time. Mental status is at baseline.     Cranial Nerves: Cranial nerves are intact.  Psychiatric:        Mood and Affect: Mood normal.        Behavior: Behavior normal.        Thought Content: Thought content normal.     LABS:   CBC Latest Ref Rng & Units 06/30/2020 05/11/2019 05/10/2019  WBC 4.0 - 10.5 K/uL 7.0 10.2 10.1  Hemoglobin 12.0 - 15.0 g/dL 12.4 8.5(L) 9.3(L)  Hematocrit 36.0 - 46.0 % 38.4 25.7(L) 27.5(L)  Platelets 150.0 - 400.0 K/uL 228.0 150 175   CMP Latest Ref Rng & Units  06/30/2020 05/11/2019 05/10/2019  Glucose 70 - 99 mg/dL 162(H) 207(H) 161(H)  BUN 6 - 23 mg/dL 17 14 13   Creatinine 0.40 - 1.20 mg/dL 0.67 0.91 0.74  Sodium 135 - 145 mEq/L 137 135 134(L)  Potassium 3.5 - 5.1 mEq/L 4.1 4.2 4.5  Chloride 96 - 112 mEq/L 103 102 103  CO2 19 - 32 mEq/L 27 23 21(L)  Calcium 8.4 - 10.5 mg/dL 9.8 8.8(L) 8.5(L)  Total Protein 6.0 - 8.3 g/dL 6.9 - -  Total Bilirubin 0.2 - 1.2 mg/dL 0.5 - -  Alkaline Phos 39 - 117 U/L 72 - -  AST 0 - 37 U/L 32 - -  ALT 0 - 35 U/L 30 - -   ASSESSMENT & PLAN:  Assessment/Plan:  A 75 y.o. female with stage IA (ypT1 N0 M0) pancreatic cancer, status post 5 cycles of neoadjuvant FOLFIRINOX chemotherapy, followed by a pylorus-sparing Whipple procedure in November 2021.  In clinic today, I reviewed her recent surgical pathology with her, for which she could see that her disease remained very small and confined, with no evidence of metastasis.  Moving forward, her plan of care will now switch to surveillance only.  I will see her back in 2 months for repeat clinical assessment.  CT scans of her abdomen/pelvis will be done a day before her next visit to ascertain her new disease baseline 4 months after her Whipple procedure.  The patient understands all the plans discussed today and is in agreement with them.    Gearldean Lomanto Macarthur Critchley, MD

## 2020-07-14 ENCOUNTER — Encounter: Payer: Self-pay | Admitting: Oncology

## 2020-07-14 ENCOUNTER — Other Ambulatory Visit: Payer: Self-pay

## 2020-07-14 ENCOUNTER — Inpatient Hospital Stay: Payer: Medicare Other | Attending: Oncology | Admitting: Oncology

## 2020-07-14 ENCOUNTER — Other Ambulatory Visit: Payer: Self-pay | Admitting: Cardiology

## 2020-07-14 VITALS — BP 146/67 | HR 73 | Temp 97.4°F | Resp 18 | Ht 63.0 in | Wt 93.4 lb

## 2020-07-14 DIAGNOSIS — C259 Malignant neoplasm of pancreas, unspecified: Secondary | ICD-10-CM

## 2020-07-15 NOTE — Telephone Encounter (Signed)
Rx refill sent to pharmacy. 

## 2020-07-16 ENCOUNTER — Telehealth: Payer: Self-pay | Admitting: Gastroenterology

## 2020-07-16 NOTE — Telephone Encounter (Signed)
Pt is requesting a call back from a nurse to discuss if she needs an antibiotic prior to her EGD.

## 2020-07-16 NOTE — Telephone Encounter (Signed)
Carriers do not need to be treated. Do make endoscopy staff aware so that they can take appropriate precautions.  We can keep her on the schedule for EGD (unless she is sick)   RG

## 2020-07-16 NOTE — Telephone Encounter (Signed)
Patient made aware that she does not need to be treated with antibiotics prior to her EGD procedure.

## 2020-07-16 NOTE — Telephone Encounter (Signed)
Spoke to patient who is scheduled for EGD 07/21/20. She reports being a carrier of staph noted during a nose swab prior to her pancreatic surgery this past November. Patient was wondering if she needed to be treated with an antibiotic prior to EGD like she was with her other surgery. Dr Lyndel Safe please advise

## 2020-07-19 IMAGING — DX DG CHEST 1V PORT
1 series · 1 of 1 positions shown · non-contrast
Comparison: 05/10/2019

CLINICAL DATA: Status post right lung wedge resection and
lobectomy.

EXAM:
PORTABLE CHEST 1 VIEW

[chest ap]
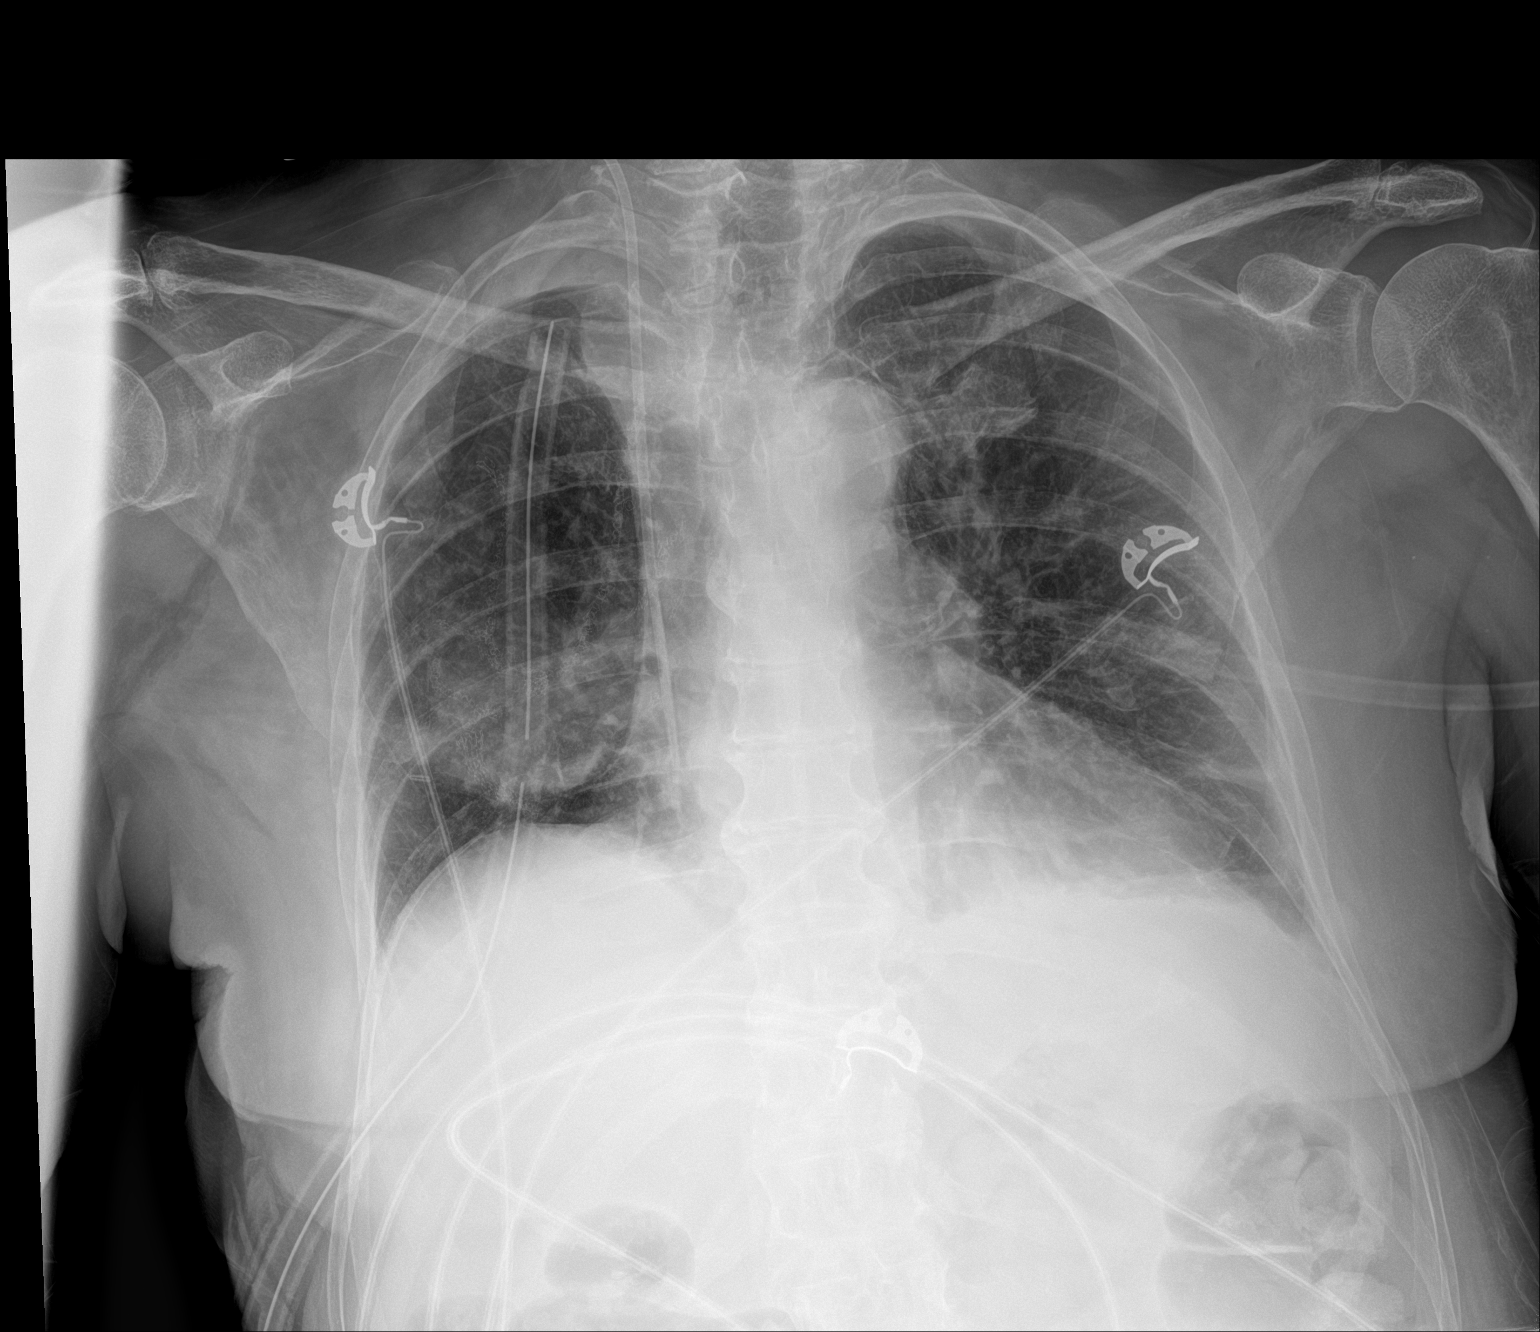

[1 of 1 positions shown; findings below may reference images not displayed]

FINDINGS: 5447 hours. Low lung volumes with asymmetric elevation of the right
hemidiaphragm. Right chest tube remains in place and a small
residual component of apical pneumothorax is suspected. Extensive
staple lines noted right lung period interval increase in left base
atelectasis. Cardiopericardial silhouette is at upper limits of
normal for size. Interstitial markings are diffusely coarsened with
chronic features. Right IJ central line tip overlies the upper right
atrium. Telemetry leads overlie the chest.
IMPRESSION: Right chest tube remains in place with apparent residual component
of apical pneumothorax.

Increasing left base atelectasis.

## 2020-07-20 ENCOUNTER — Ambulatory Visit (AMBULATORY_SURGERY_CENTER): Payer: Self-pay

## 2020-07-20 ENCOUNTER — Other Ambulatory Visit: Payer: Self-pay

## 2020-07-20 VITALS — Ht 63.0 in | Wt 95.0 lb

## 2020-07-20 DIAGNOSIS — R1319 Other dysphagia: Secondary | ICD-10-CM

## 2020-07-20 DIAGNOSIS — C250 Malignant neoplasm of head of pancreas: Secondary | ICD-10-CM

## 2020-07-20 DIAGNOSIS — K2100 Gastro-esophageal reflux disease with esophagitis, without bleeding: Secondary | ICD-10-CM

## 2020-07-20 NOTE — Progress Notes (Signed)
No egg or soy allergy known to patient  No issues with past sedation with any surgeries or procedures No intubation problems in the past  No FH of Malignant Hyperthermia No diet pills per patient No home 02 use per patient  No blood thinners per patient  Pt denies issues with constipation  No A fib or A flutter  EMMI video to pt or via [place] 19 guidelines implemented in PV today with Pt and RN  Pt is fully vaccinated  for Covid and Booster   Due to the COVID-19 pandemic we are asking patients to follow certain guidelines.  Pt aware of COVID protocols and [facility] guidelines

## 2020-07-21 ENCOUNTER — Encounter: Payer: Self-pay | Admitting: Gastroenterology

## 2020-07-21 ENCOUNTER — Ambulatory Visit (AMBULATORY_SURGERY_CENTER): Payer: Medicare Other | Admitting: Gastroenterology

## 2020-07-21 VITALS — BP 165/64 | HR 62 | Temp 97.3°F | Resp 14 | Ht 63.0 in | Wt 95.0 lb

## 2020-07-21 DIAGNOSIS — K2970 Gastritis, unspecified, without bleeding: Secondary | ICD-10-CM

## 2020-07-21 DIAGNOSIS — C250 Malignant neoplasm of head of pancreas: Secondary | ICD-10-CM

## 2020-07-21 DIAGNOSIS — K2100 Gastro-esophageal reflux disease with esophagitis, without bleeding: Secondary | ICD-10-CM

## 2020-07-21 DIAGNOSIS — K2950 Unspecified chronic gastritis without bleeding: Secondary | ICD-10-CM | POA: Diagnosis not present

## 2020-07-21 DIAGNOSIS — R1319 Other dysphagia: Secondary | ICD-10-CM | POA: Diagnosis not present

## 2020-07-21 DIAGNOSIS — K219 Gastro-esophageal reflux disease without esophagitis: Secondary | ICD-10-CM | POA: Diagnosis not present

## 2020-07-21 DIAGNOSIS — K2990 Gastroduodenitis, unspecified, without bleeding: Secondary | ICD-10-CM | POA: Diagnosis not present

## 2020-07-21 DIAGNOSIS — K269 Duodenal ulcer, unspecified as acute or chronic, without hemorrhage or perforation: Secondary | ICD-10-CM | POA: Diagnosis not present

## 2020-07-21 HISTORY — PX: UPPER GASTROINTESTINAL ENDOSCOPY: SHX188

## 2020-07-21 IMAGING — DX DG CHEST 1V SAME DAY
1 series · 1 of 1 positions shown · non-contrast
Comparison: Chest radiograph 05/13/2019

CLINICAL DATA: Chest tube removed.

EXAM:
CHEST - 1 VIEW SAME DAY

[chest ap]
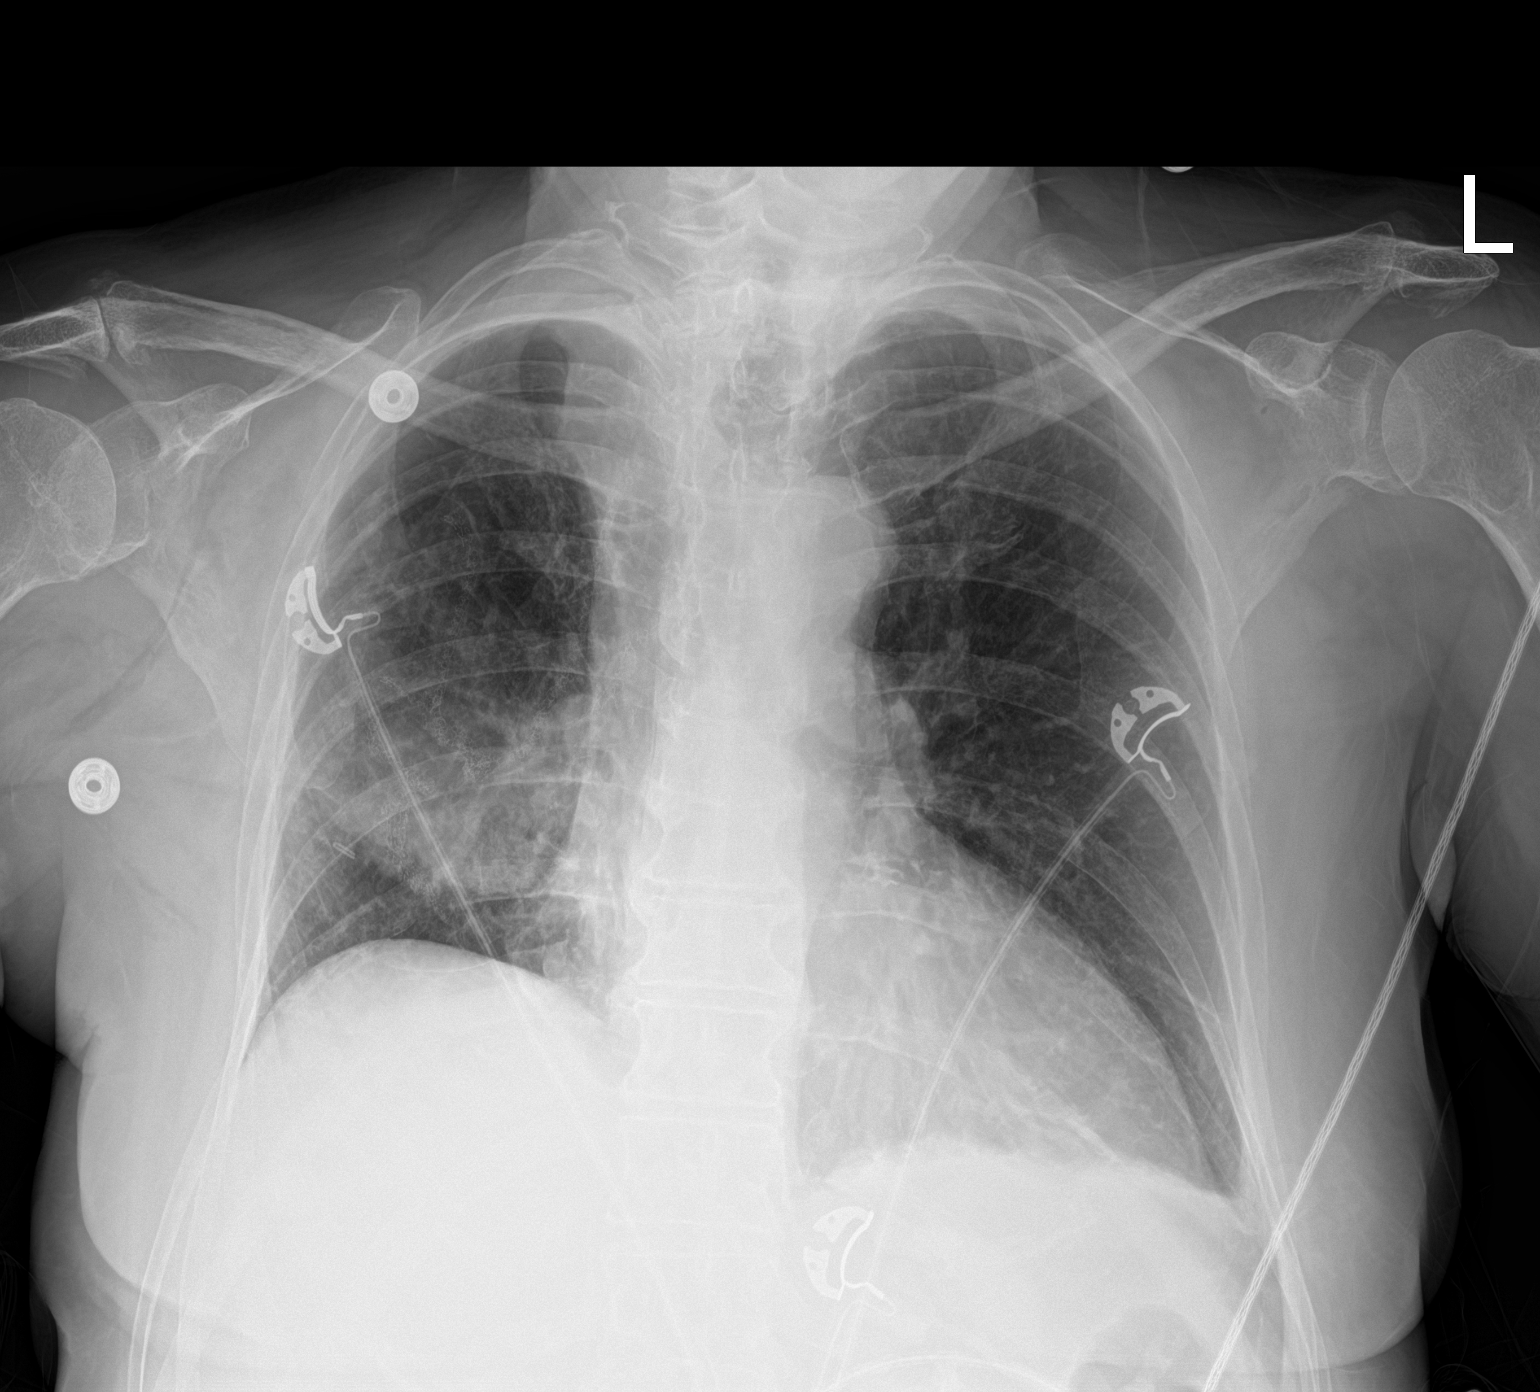

[1 of 1 positions shown; findings below may reference images not displayed]

FINDINGS: Interval removal of the right-sided chest tube.  No pneumothorax.

Redemonstrated postsurgical changes to the right mid lung with
associated atelectasis and/or infiltrate. Persistent minimal left
basilar atelectasis also unchanged. Cardiomediastinal silhouette
within normal limits. No acute bony abnormality
IMPRESSION: Interval removal of a right chest tube.  No definite pneumothorax.

Redemonstrated postsurgical changes to the right midlung with
associated atelectasis and/or infiltrate.

Minimal left basilar atelectasis also unchanged.

## 2020-07-21 IMAGING — DX DG CHEST 1V PORT
1 series · 1 of 1 positions shown · non-contrast
Comparison: May 12, 2019.

CLINICAL DATA: Shortness of breath.

EXAM:
PORTABLE CHEST 1 VIEW

[chest ap]
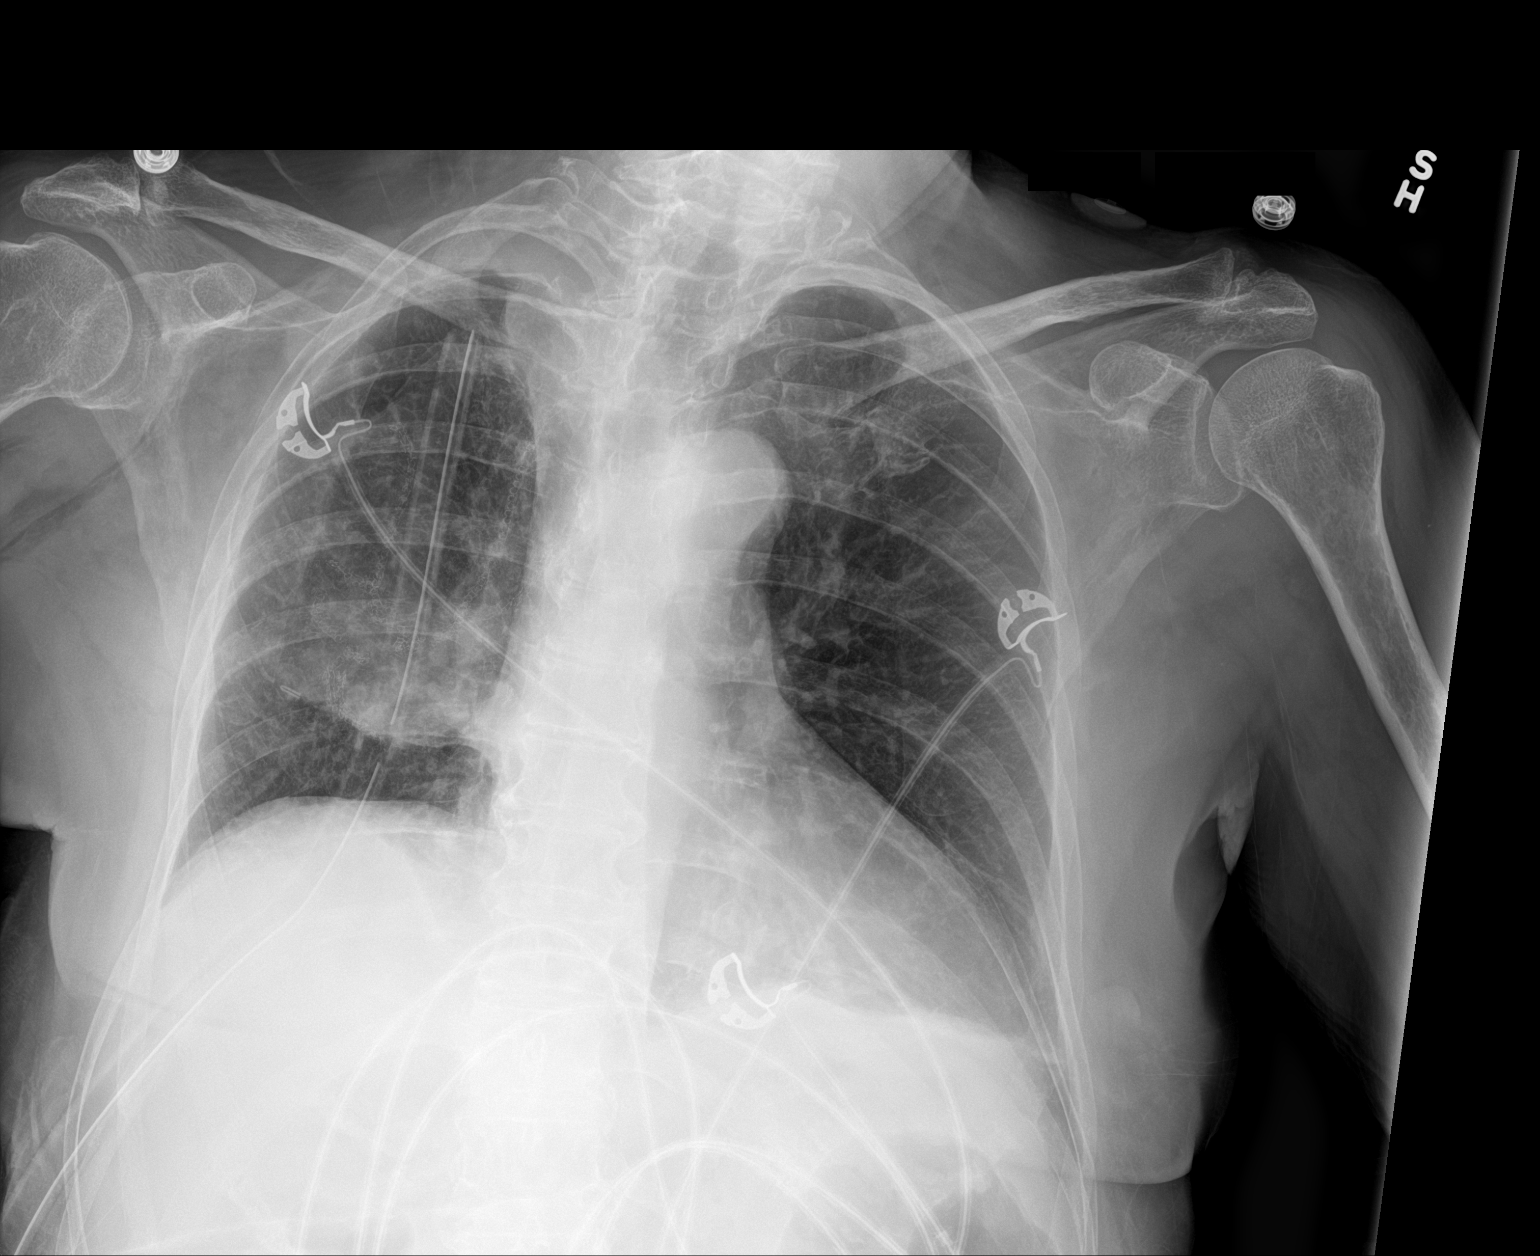

[1 of 1 positions shown; findings below may reference images not displayed]

FINDINGS: Stable cardiomediastinal silhouette. Right-sided chest tube is
unchanged in position with minimal right apical pneumothorax.
Minimal left basilar subsegmental atelectasis is noted. Right
midlung atelectasis or infiltrate is noted. Postoperative changes
noted in the right midlung. Bony thorax is unremarkable.
IMPRESSION: Right-sided chest tube is unchanged in position with minimal right
apical pneumothorax. Right midlung atelectasis or infiltrate is
noted with postoperative change in the right midlung. Minimal left
basilar subsegmental atelectasis.

## 2020-07-21 MED ORDER — SODIUM CHLORIDE 0.9 % IV SOLN: 500.0000 mL | Freq: Once | INTRAVENOUS | Status: DC

## 2020-07-21 NOTE — Progress Notes (Signed)
Pt's states no medical or surgical changes since previsit or office visit.  ° °Vitals CW °

## 2020-07-21 NOTE — Patient Instructions (Addendum)
Read all of the handouts given to you by your recovery room nurse.  Continue all of your medications today.  YOU HAD AN ENDOSCOPIC PROCEDURE TODAY AT [facility]:   Refer to the procedure report that was given to you for any specific questions about what was found during the examination.  If the procedure report does not answer your questions, please call your gastroenterologist to clarify.  If you requested that your care partner not be given the details of your procedure findings, then the procedure report has been included in a sealed envelope for you to review at your convenience later.  YOU SHOULD EXPECT: Some feelings of bloating in the abdomen. Passage of more gas than usual.  Walking can help get rid of the air that was put into your GI tract during the procedure and reduce the bloating. If you had a lower endoscopy (such as a colonoscopy or flexible sigmoidoscopy) you may notice spotting of blood in your stool or on the toilet paper. If you underwent a bowel prep for your procedure, you may not have a normal bowel movement for a few days.  Please Note:  You might notice some irritation and congestion in your nose or some drainage.  This is from the oxygen used during your procedure.  There is no need for concern and it should clear up in a day or so.  SYMPTOMS TO REPORT IMMEDIATELY:    Following upper endoscopy (EGD)  Vomiting of blood or coffee ground material  New chest pain or pain under the shoulder blades  Painful or persistently difficult swallowing  New shortness of breath  Fever of 100F or higher  Black, tarry-looking stools  For urgent or emergent issues, a gastroenterologist can be reached at any hour by calling [phone]. Do not use MyChart messaging for urgent concerns.    DIET:  We do recommend nothing by mouth until 11:30 a m.  Then, clear liquids from 12:30 p m until 1 pm.  You may have a soft diet for the rest of today.  You may have a regular  diet tomorrow. but then you may proceed to your regular diet.  Drink plenty of fluids but you should avoid alcoholic beverages for 24 hours.  ACTIVITY:  You should plan to take it easy for the rest of today and you should NOT DRIVE or use heavy machinery until tomorrow (because of the sedation medicines used during the test).    FOLLOW UP: Our staff will call the number listed on your records 48-72 hours following your procedure to check on you and address any questions or concerns that you may have regarding the information given to you following your procedure. If we do not reach you, we will leave a message.  We will attempt to reach you two times.  During this call, we will ask if you have developed any symptoms of COVID 19. If you develop any symptoms (ie: fever, flu-like symptoms, shortness of breath, cough etc.) before then, please call [phone].  If you test positive for Covid 19 in the 2 weeks post procedure, please call and report this information to [place].    If any biopsies were taken you will be contacted by phone or by letter within the next 1-3 weeks.  Please call us at [phone] if you have not heard about the biopsies in 3 weeks.    SIGNATURES/CONFIDENTIALITY: You and/or your care partner have signed paperwork which will be entered into your electronic  medical record.  These signatures attest to the fact that that the information above on your After Visit Summary has been reviewed and is understood.  Full responsibility of the confidentiality of this discharge information lies with you and/or your care-partner.

## 2020-07-21 NOTE — Progress Notes (Signed)
Called to room to assist during endoscopic procedure.  Patient ID and intended procedure confirmed with present staff. Received instructions for my participation in the procedure from the performing physician.  

## 2020-07-21 NOTE — Progress Notes (Signed)
PT taken to PACU. Monitors in place. VSS. Report given to RN. 

## 2020-07-21 NOTE — Op Note (Signed)
[facility] Patient Name: Holly Harper Procedure Date: 07/21/2020 10:13 AM MRN: [id number] Endoscopist: Jackquline Denmark , MD Age: 75 Referring MD:  Date of Birth: 09-02-1945 Gender: Female Account #: [id number] Procedure:                Upper GI endoscopy Indications:              #1. GERD                           #2. Eso dysphagia.                           #3. Panc Head AdenoCa s/p neoadjuvant chemotherapy                            (with FOLFININOX) followed by pylorus preserving                            whipple [id number], stage IA with R0 resection)                            04/2020. Medicines:                Monitored Anesthesia Care Procedure:                Pre-Anesthesia Assessment:                           - Prior to the procedure, a History and Physical                            was performed, and patient medications and                            allergies were reviewed. The patient's tolerance of                            previous anesthesia was also reviewed. The risks                            and benefits of the procedure and the sedation                            options and risks were discussed with the patient.                            All questions were answered, and informed consent                            was obtained. Prior Anticoagulants: The patient has                            taken no previous anticoagulant or antiplatelet                            agents. ASA Grade Assessment:  III - A patient with                            severe systemic disease. After reviewing the risks                            and benefits, the patient was deemed in                            satisfactory condition to undergo the procedure.                           After obtaining informed consent, the endoscope was                            passed under direct vision. Throughout the                            procedure, the patient's blood pressure, pulse,  and                            oxygen saturations were monitored continuously. The                            Endoscope was introduced through the mouth, and                            advanced to the second part of duodenum. The upper                            GI endoscopy was accomplished without difficulty.                            The patient tolerated the procedure well. Scope In: Scope Out: Findings:                 The examined esophagus was moderately tortuous with                            Z-line at 35 cm.                           Patchy, white plaques were found in the entire                            esophagus. Biopsies were taken with a cold forceps                            for histology. The scope was withdrawn. Dilation                            was performed with a Maloney dilator with mild  resistance at 50 Fr.                           Diffuse mild inflammation characterized by erythema                            was found in the stomach. The pylorus was deformed                            due to Whipple's. Biopsies were taken with a cold                            forceps for histology.                           There was evidence of a widely patent                            pancreaticoduodenectomy (Whipple) in the first                            portion of the duodenum and in the second portion                            of the duodenum. This was characterized by healthy                            appearing mucosa. Both limbs were intubated. There                            was some food material in the duodenum which did                            limit examination. Suture material was noted. There                            were no ulcers or any masses. Biopsies were taken                            with a cold forceps for histology. Complications:            No immediate complications. Estimated Blood Loss:     Estimated blood  loss: none. Impression:               - Presbyesophagus s/p esophageal dilatation.                           - Esophageal plaques were found, suspicious for                            candidiasis. Biopsied. Dilated.                           - Atrophic gastritis. Biopsied.                           -  Widely patent pylorus sparing                            pancreaticoduodenectomy (Whipple), characterized by                            healthy appearing mucosa was found. Biopsied. Recommendation:           - Patient has a contact number available for                            emergencies. The signs and symptoms of potential                            delayed complications were discussed with the                            patient. Return to normal activities tomorrow.                            Written discharge instructions were provided to the                            patient.                           - Post dilatation diet.                           - Continue present medications.                           - Await pathology results.                           - The findings and recommendations were discussed                            with the patient's family. Jackquline Denmark, MD 07/21/2020 10:39:06 AM This report has been signed electronically.

## 2020-07-22 ENCOUNTER — Other Ambulatory Visit: Payer: Self-pay | Admitting: Gastroenterology

## 2020-07-22 ENCOUNTER — Telehealth: Payer: Self-pay | Admitting: Oncology

## 2020-07-22 IMAGING — CR DG CHEST 2V
2 series · 2 of 2 positions shown · non-contrast
Comparison: 05/13/2019

CLINICAL DATA: Status post right upper lobectomy.

EXAM:
CHEST - 2 VIEW

[chest lat]
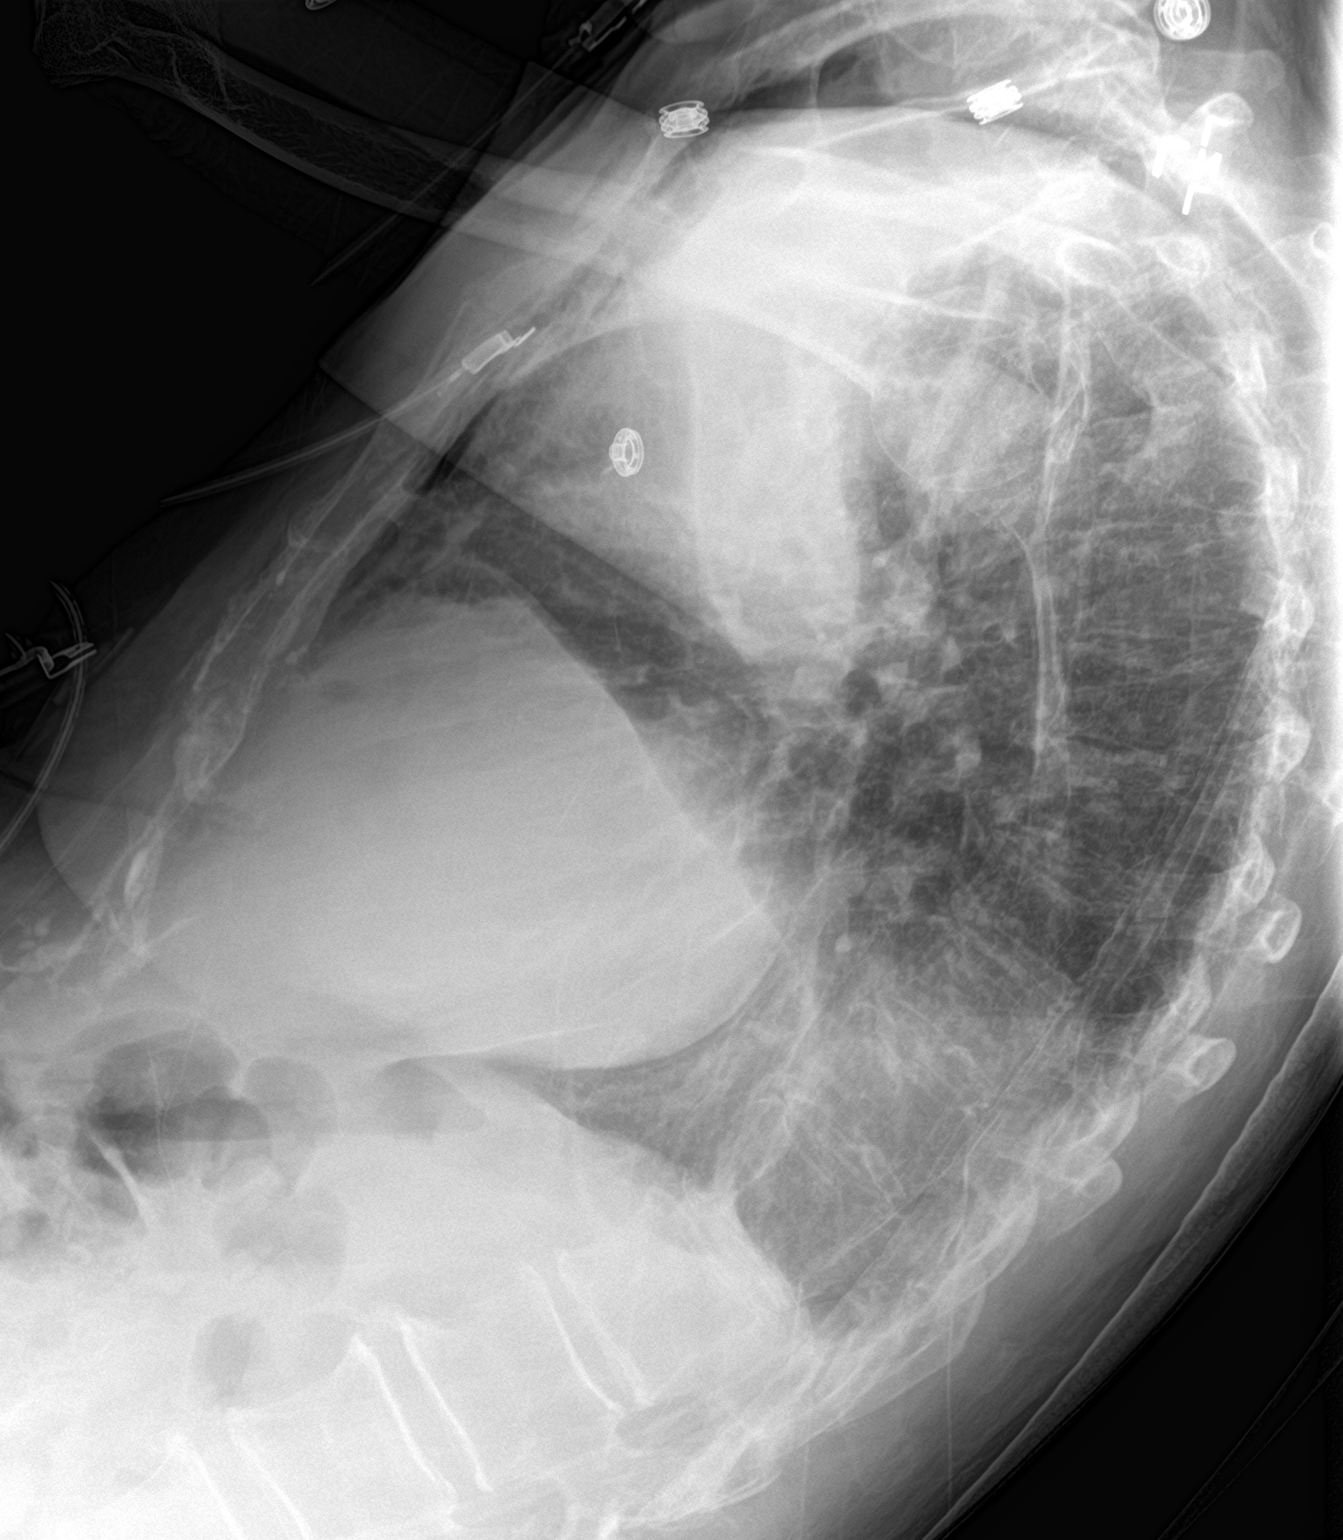

[chest ap]
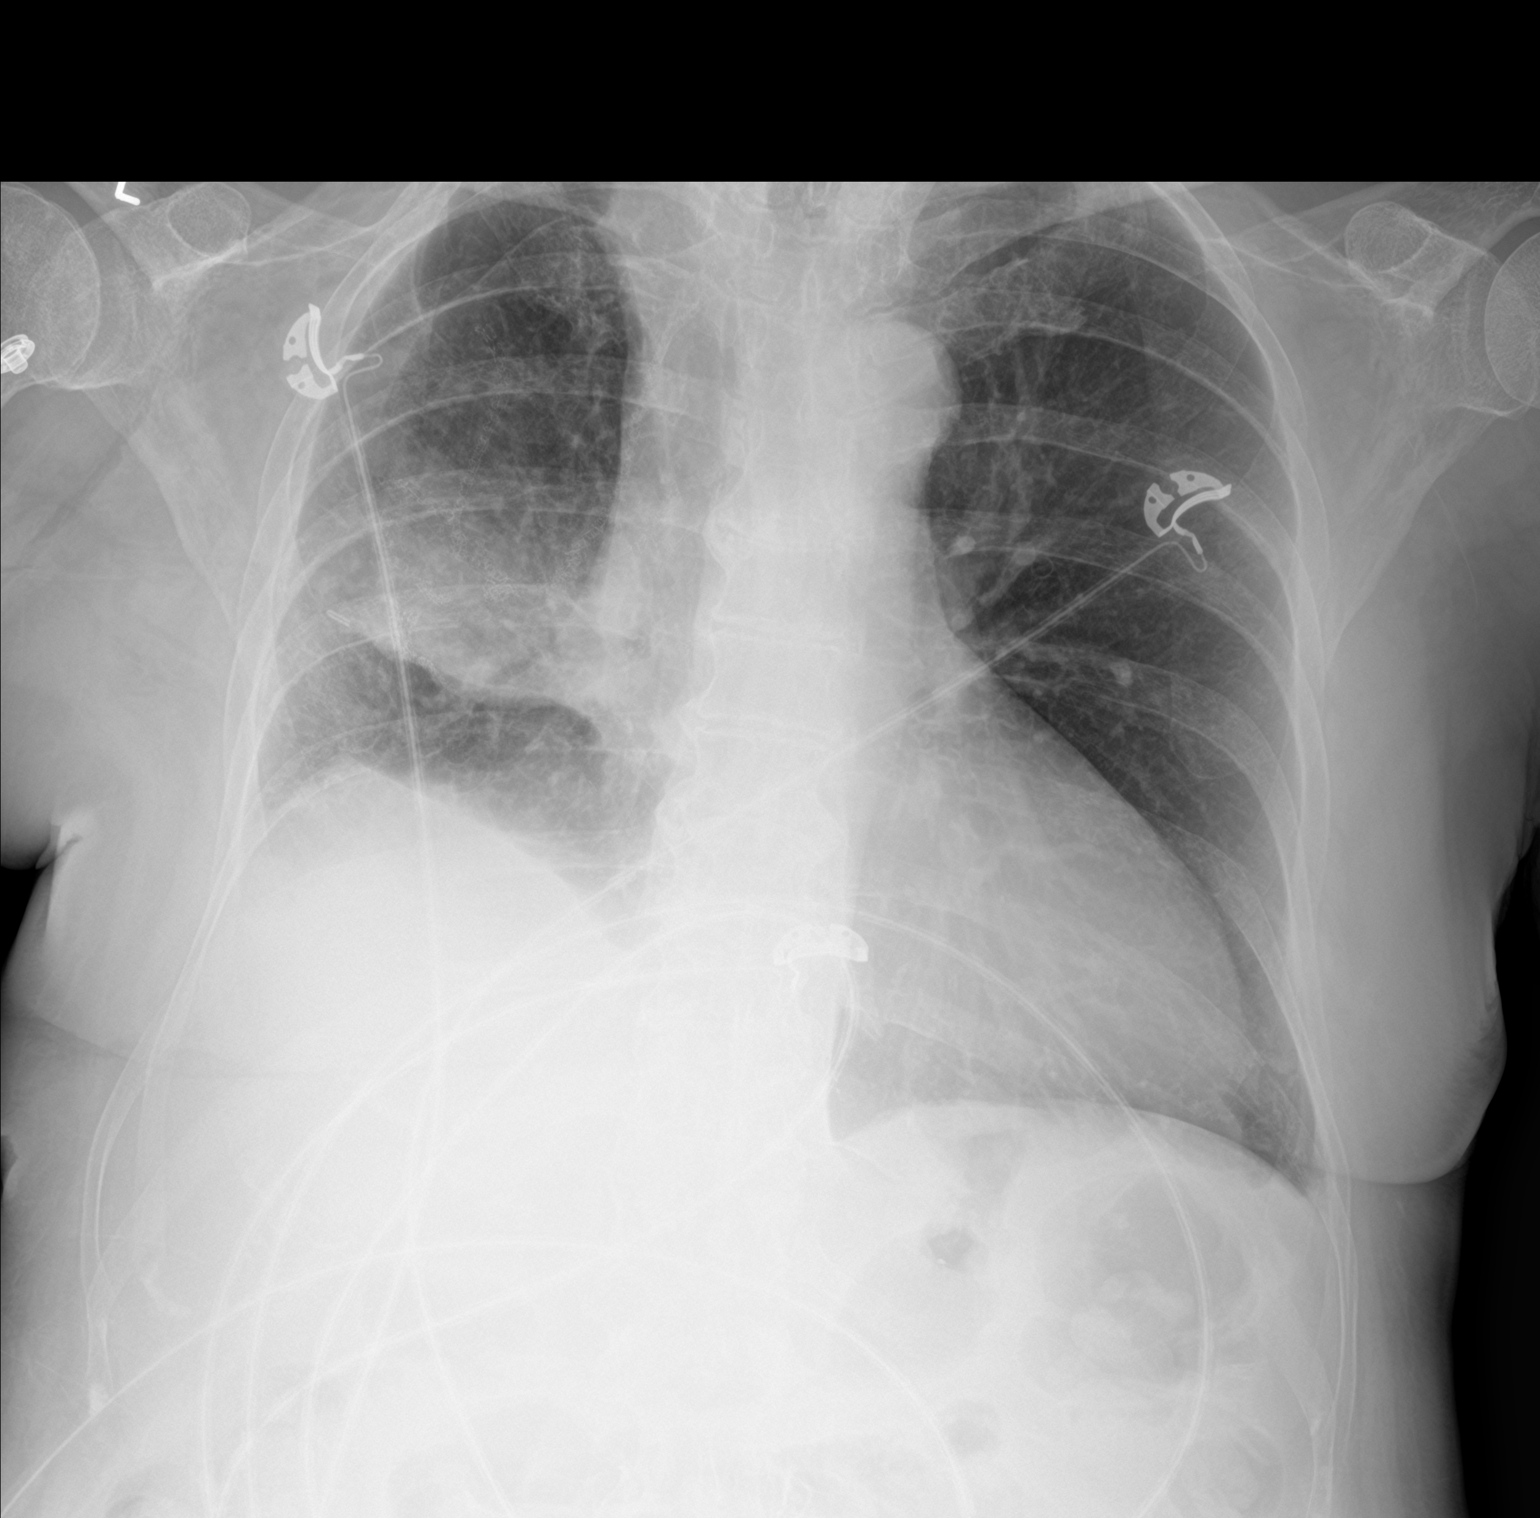

[2 of 2 positions shown; findings below may reference images not displayed]

FINDINGS: Stable heart size. There is some increase atelectasis of the right
lung since the prior study with potentially some pleural fluid in
the major fissure. On the lateral view there may be a tiny anterior
pneumothorax. The left lung is normally aerated.
IMPRESSION: Increased atelectasis of the right lung since the prior study with
potentially some pleural fluid in the major fissure. There may be a
tiny anterior pneumothorax on the lateral view.

## 2020-07-22 NOTE — Telephone Encounter (Signed)
Per 1/25 LOS, patient scheduled for 3/24 Labs, CT Scans - 3/25 Follow  Mailed patient Orders/Instructions - Appt Summary

## 2020-07-23 ENCOUNTER — Telehealth: Payer: Self-pay

## 2020-07-23 NOTE — Telephone Encounter (Signed)
  Follow up Call-  Call back number 07/21/2020  Post procedure Call Back phone  # [phone]  Permission to leave phone message Yes  Some recent data might be hidden     Patient questions:  Do you have a fever, pain , or abdominal swelling? No. Pain Score  0 *  Have you tolerated food without any problems? No.  Have you been able to return to your normal activities? Yes.    Do you have any questions about your discharge instructions: Diet   No. Medications  No. Follow up visit  No.  Do you have questions or concerns about your Care? Yes.   Patient states she has yeast in her throat, and that she is taking Diflucan as prescribed.  Actions: * If pain score is 4 or above:   No action needed, pain <4.  1. Have you developed a fever since your procedure? no  2.   Have you had an respiratory symptoms (SOB or cough) since your procedure? no  3.   Have you tested positive for COVID 19 since your procedure no  4.   Have you had any family members/close contacts diagnosed with the COVID 19 since your procedure?  no   If yes to any of these questions please route to Joylene John, RN and Joella Prince, RN

## 2020-07-29 ENCOUNTER — Telehealth: Payer: Self-pay | Admitting: Gastroenterology

## 2020-07-29 NOTE — Telephone Encounter (Signed)
Patient requesting path results.

## 2020-07-29 NOTE — Telephone Encounter (Signed)
A message was left with patient to let her know that her path results ane not back at this time. She will be notified once Dr Lyndel Safe reviews them.

## 2020-07-29 NOTE — Telephone Encounter (Signed)
Patient returned your call about results and was given your message.

## 2020-08-02 ENCOUNTER — Encounter: Payer: Self-pay | Admitting: Gastroenterology

## 2020-08-10 ENCOUNTER — Telehealth: Payer: Self-pay

## 2020-08-10 NOTE — Telephone Encounter (Signed)
Pt called to request someone call her & discuss if the chemo is what has caused the white spots in her esophagus. Pt states she had recent endoscopy and was told the white spots were not infection. She is still having a hard time swallowing. Please advise.  I did forward this call to you as well. [phone]

## 2020-08-11 NOTE — Telephone Encounter (Signed)
Melissa P,NP, attempted call to pt yesterday (08/10/20)- no answer.

## 2020-08-11 NOTE — Telephone Encounter (Signed)
Per Lenna Sciara, NP, I spoke to her. I told her I did not feel it was anything from our area, but she is scheduled for CT imaging and follow up with [facility] next month. She did not want to move that appointment up.

## 2020-08-13 NOTE — Progress Notes (Signed)
Cardiology Office Note:    Date:  08/14/2020   ID:  Holly Harper, DOB 29-Aug-1945, MRN [id number]  PCP:  Garwin Brothers, MD  Cardiologist:  Shirlee More, MD    Referring MD: Garwin Brothers, MD    ASSESSMENT:    1. Essential hypertension   2. Paroxysmal atrial fibrillation (HCC)   3. Mixed hyperlipidemia    PLAN:    In order of problems listed above:  1. Stable blood pressure is consistently in range on a regimen including beta-blocker and she will continue to trend monitor at home 2. Stable no clinical recurrence 3. Stable lipids at target she takes a high intensity statin 4. Her predominant problem is swallowing dysfunction   Next appointment: She has multiple physicians that she sees and I will plan to see her back in the office as needed I did ask her to sign up with my chart so she could send communication   Medication Adjustments/Labs and Tests Ordered: Current medicines are reviewed at length with the patient today.  Concerns regarding medicines are outlined above.  No orders of the defined types were placed in this encounter.  No orders of the defined types were placed in this encounter.   Chief Complaint  Patient presents with  . Follow-up    History of Present Illness:    Holly Harper is a 75 y.o. female with a hx of difficult to control hypertension, hyperlipidemia paroxysmal atrial fibrillation in the setting of lung cancer surgery and lung cancer last seen 02/12/2020. I reviewed the [facility] records from her admission to the hospital 05/09/2019 at 05/15/2019 with resection of adenocarcinoma right upper lobe T1N0 stage Ia with thoracotomy.  Her course was complicated by sinus tachycardia hypotension requiring IV fluids and a small air leak in her chest tube. She developed atrial fibrillation and was treated with amiodarone and converted to sinus rhythm.    Compliance with diet, lifestyle and medications: Yes  she is slowly improving still has  swallowing dysfunction she had endoscopy and biopsy and she questions whether it is related to chemotherapy with neuropathy.  She has had no palpitation edema shortness of breath chest pain or syncope. Recent labs done at her PCP office 06/30/2020 shows normal creatinine 0.67 glucose 162.  Her last lipid profile March 2021 cholesterol 109 LDL 41 and she told me she has plans for follow-up labs with her PCP.  Her home blood pressure consistently runs 1 13-2 20 systolic Past Medical History:  Diagnosis Date  . Allergic rhinitis   . Anxiety   . Asthma   . Diabetes ([place])   . Dysphagia   . Family history of adverse reaction to anesthesia    sister had PONV  . GERD (gastroesophageal reflux disease)   . Hiatal hernia   . Hypercholesterolemia   . Hypertension   . Lung cancer ([place]) 2020  . Pancreatic cancer ([place]) 2021  . Paresthesia     Past Surgical History:  Procedure Laterality Date  . COLONOSCOPY  02/10/2016   Mild pancolonic diverticulosis. Otherwise, normal colonoscopy  . ESOPHAGOGASTRODUODENOSCOPY  08/24/2011   Prebyesophagus. Mininal hiatal hernia. Mild gastritis  . ESOPHAGOGASTRODUODENOSCOPY (EGD) WITH PROPOFOL N/A 11/14/2019   Procedure: ESOPHAGOGASTRODUODENOSCOPY (EGD) WITH PROPOFOL;  Surgeon: [name], MD;  Location: [facility];  Service: Endoscopy;  Laterality: N/A;  . EUS N/A 11/14/2019   Procedure: UPPER ENDOSCOPIC ULTRASOUND (EUS) RADIAL;  Surgeon: [name], MD;  Location: [facility];  Service: Endoscopy;  Laterality: N/A;  .  FINE NEEDLE ASPIRATION N/A 11/14/2019   Procedure: FINE NEEDLE ASPIRATION (FNA) LINEAR;  Surgeon: [name], MD;  Location: [facility];  Service: Endoscopy;  Laterality: N/A;  . INTERCOSTAL NERVE BLOCK Right 05/09/2019   Procedure: Intercostal Nerve Block thoracic;  Surgeon: Melrose Nakayama, MD;  Location: [facility];  Service: Thoracic;  Laterality: Right;  . LASIK Bilateral   . LIPOMA RESECTION    . LOBECTOMY Right  05/09/2019   Procedure: LOBECTOMY;  Surgeon: Melrose Nakayama, MD;  Location: [facility];  Service: Thoracic;  Laterality: Right;  . UPPER GASTROINTESTINAL ENDOSCOPY  2008  . UPPER GASTROINTESTINAL ENDOSCOPY  07/21/2020  . VIDEO ASSISTED THORACOSCOPY (VATS)/WEDGE RESECTION Right 05/09/2019   Procedure: VIDEO ASSISTED THORACOSCOPY (VATS)/WEDGE RESECTION FOR FROZEN SECTIONS ;  Surgeon: Melrose Nakayama, MD;  Location: [facility];  Service: Thoracic;  Laterality: Right;  . WHIPPLE PROCEDURE  04/30/2020    Current Medications: Current Meds  Medication Sig  . acetaminophen (TYLENOL) 325 MG tablet Take 2 tablets (650 mg total) by mouth every 6 (six) hours as needed for mild pain.  Marland Kitchen albuterol (VENTOLIN HFA) 108 (90 Base) MCG/ACT inhaler Inhale 2 puffs into the lungs every 6 (six) hours as needed for wheezing or shortness of breath.  Marland Kitchen aspirin EC 81 MG tablet Take 1 tablet (81 mg total) by mouth daily.  Marland Kitchen atenolol (TENORMIN) 50 MG tablet TAKE 1 TABLET BY MOUTH EVERY DAY  . atorvastatin (LIPITOR) 10 MG tablet TAKE 1 TABLET BY MOUTH EVERYDAY AT BEDTIME  . azelastine (ASTELIN) 0.1 % nasal spray Place 1 spray into both nostrils 2 (two) times daily as needed for rhinitis. Use in each nostril as directed  . Carboxymethylcellul-Glycerin (LUBRICATING EYE DROPS OP) Place 1 drop into both eyes daily as needed (dry eyes).  . dapagliflozin propanediol (FARXIGA) 5 MG TABS tablet Take 5 mg by mouth daily.  . famotidine (PEPCID) 40 MG tablet TAKE 1 TABLET (40 MG TOTAL) BY MOUTH DAILY. CALL [phone] TO SCHEDULE AN OFFICE VISIT FOR MORE REFILLS  . fluticasone (VERAMYST) 27.5 MCG/SPRAY nasal spray Place 1 spray into the nose daily as needed for rhinitis.  Marland Kitchen glipiZIDE (GLUCOTROL XL) 5 MG 24 hr tablet Take 5 mg by mouth daily with breakfast.  . lidocaine-prilocaine (EMLA) cream Apply topically.  . metFORMIN (GLUCOPHAGE-XR) 500 MG 24 hr tablet Take 500 mg by mouth 3 (three) times daily.  . montelukast (SINGULAIR)  10 MG tablet TAKE 1 TABLET BY MOUTH EVERY DAY  . NON FORMULARY Take 1,200 mg by mouth at bedtime as needed (Sleep). Delta 8 for sleep  . nystatin (MYCOSTATIN) 100000 UNIT/ML suspension Swish and swallow 80ml by mouth three times daily for 10 days  . ondansetron (ZOFRAN) 4 MG tablet Take 4 mg by mouth every 4 (four) hours as needed.  . Pancrelipase, Lip-Prot-Amyl, (ZENPEP PO) Take by mouth.  . pantoprazole (PROTONIX) 40 MG tablet TAKE 1 TABLET BY MOUTH EVERY DAY  . prochlorperazine (COMPAZINE) 10 MG tablet Take 10 mg by mouth every 6 (six) hours as needed.  Marland Kitchen Propylene Glycol (SYSTANE BALANCE) 0.6 % SOLN 1 drop as needed.  . traZODone (DESYREL) 50 MG tablet Take 50 mg by mouth at bedtime as needed for sleep.  . [DISCONTINUED] albuterol (VENTOLIN HFA) 108 (90 Base) MCG/ACT inhaler USE 2 INHALATIONS ORALLY   EVERY 4 TO 6 HOURS AS      NEEDED FOR COUGH OR WHEEZE (Patient taking differently: Inhale into the lungs every 6 (six) hours as needed. USE 2 INHALATIONS  ORALLY   EVERY 4 TO 6 HOURS AS      NEEDED FOR COUGH OR WHEEZE)  . [DISCONTINUED] azelastine (ASTELIN) 0.1 % nasal spray Can use two sprays in each nostril twice daily if needed. (Patient taking differently: Place into both nostrils 2 (two) times daily. Can use two sprays in each nostril twice daily if needed.)  . [DISCONTINUED] fluticasone (FLONASE) 50 MCG/ACT nasal spray Can use one spray in each nostril one to two times daily if needed. (Patient taking differently: Place 1 spray into both nostrils daily as needed for allergies.)     Allergies:   Amoxicillin, Cinobac [cinoxacin], Macrodantin [nitrofurantoin macrocrystal], and Semaglutide   Social History   Socioeconomic History  . Marital status: Married    Spouse name: Not on file  . Number of children: Not on file  . Years of education: Not on file  . Highest education level: Not on file  Occupational History  . Not on file  Tobacco Use  . Smoking status: Never Smoker  . Smokeless  tobacco: Never Used  Vaping Use  . Vaping Use: Never used  Substance and Sexual Activity  . Alcohol use: Not Currently    Comment: quit drinking years ago 1980 or before  . Drug use: Never  . Sexual activity: Not on file  Other Topics Concern  . Not on file  Social History Narrative  . Not on file   Social Determinants of Health   Financial Resource Strain: Not on file  Food Insecurity: Not on file  Transportation Needs: Not on file  Physical Activity: Not on file  Stress: Not on file  Social Connections: Not on file     Family History: The patient's family history includes Bipolar disorder in her sister; Colon polyps in her sister; Lung cancer in her brother; Prostate cancer in her father. There is no history of Colon cancer, Esophageal cancer, Rectal cancer, or Stomach cancer. ROS:   Please see the history of present illness.    All other systems reviewed and are negative.  EKGs/Labs/Other Studies Reviewed:    The following studies were reviewed today:    Recent Labs: 06/30/2020: ALT 30; BUN 17; Creatinine, Ser 0.67; Hemoglobin 12.4; Platelets 228.0; Potassium 4.1; Sodium 137  Recent Lipid Panel No results found for: CHOL, TRIG, HDL, CHOLHDL, VLDL, LDLCALC, LDLDIRECT  Physical Exam:    VS:  BP (!) 120/50 (BP Location: Left Arm, Patient Position: Sitting, Cuff Size: Small)   Pulse 64   Ht 5\' 3"  (1.6 m)   Wt 93 lb 12.8 oz (42.5 kg)   SpO2 96%   BMI 16.62 kg/m     Wt Readings from Last 3 Encounters:  08/14/20 93 lb 12.8 oz (42.5 kg)  07/21/20 95 lb (43.1 kg)  07/20/20 95 lb (43.1 kg)     GEN: She looks quite thin frail well nourished, well developed in no acute distress HEENT: Normal NECK: No JVD; No carotid bruits LYMPHATICS: No lymphadenopathy CARDIAC: RRR, no murmurs, rubs, gallops RESPIRATORY:  Clear to auscultation without rales, wheezing or rhonchi  ABDOMEN: Soft, non-tender, non-distended MUSCULOSKELETAL:  No edema; No deformity  SKIN: Warm and  dry NEUROLOGIC:  Alert and oriented x 3 PSYCHIATRIC:  Normal affect    Signed, Shirlee More, MD  08/14/2020 10:32 AM    [place]

## 2020-08-14 ENCOUNTER — Other Ambulatory Visit: Payer: Self-pay

## 2020-08-14 ENCOUNTER — Encounter: Payer: Self-pay | Admitting: Cardiology

## 2020-08-14 ENCOUNTER — Ambulatory Visit (INDEPENDENT_AMBULATORY_CARE_PROVIDER_SITE_OTHER): Payer: Medicare Other | Admitting: Cardiology

## 2020-08-14 VITALS — BP 120/50 | HR 64 | Ht 63.0 in | Wt 93.8 lb

## 2020-08-14 DIAGNOSIS — I480 Paroxysmal atrial fibrillation: Secondary | ICD-10-CM

## 2020-08-14 DIAGNOSIS — E782 Mixed hyperlipidemia: Secondary | ICD-10-CM | POA: Diagnosis not present

## 2020-08-14 DIAGNOSIS — I10 Essential (primary) hypertension: Secondary | ICD-10-CM

## 2020-08-14 NOTE — Patient Instructions (Signed)

## 2020-08-17 ENCOUNTER — Other Ambulatory Visit: Payer: Self-pay | Admitting: Hematology and Oncology

## 2020-08-19 ENCOUNTER — Other Ambulatory Visit: Payer: Self-pay | Admitting: Gastroenterology

## 2020-08-21 ENCOUNTER — Telehealth: Payer: Self-pay | Admitting: Gastroenterology

## 2020-08-21 DIAGNOSIS — R0989 Other specified symptoms and signs involving the circulatory and respiratory systems: Secondary | ICD-10-CM

## 2020-08-21 DIAGNOSIS — R1310 Dysphagia, unspecified: Secondary | ICD-10-CM

## 2020-08-21 NOTE — Telephone Encounter (Signed)
Left message for pt to call back.  Pt reports even after having the EGD with dilation she is still having difficulty swallowing. Reports she has a lot of thick phlegm in the mornings and that almost chokes her. She wants to know what the next step is with her care, or who she should see. States she needs to know what is wrong with her throat. Please advise.

## 2020-08-21 NOTE — Telephone Encounter (Signed)
Inbound call from patient stating she is still having issues after colonoscopy and requests a call from a nurse please to discuss further.

## 2020-08-24 ENCOUNTER — Telehealth: Payer: Self-pay

## 2020-08-24 NOTE — Telephone Encounter (Signed)
Notified Dr Bobby Rumpf of pt's concerns. He verbalized that the scan of the abdomen would definitely show anything that would involve the incisions. Pt assured the scan will see it all. She thanked me for return call.

## 2020-08-24 NOTE — Telephone Encounter (Signed)
Pt called to report that she had her upper endoscopy done a couple weeks back. "He took a couple biopsies, both were negative. He also stretched my esophagus. I still have trouble swallowing things like bread".  Pt also reports she has a couple of small areas on her stomach near Whipple incisions that are sore & hard. No redness. No warmth to areas. Afebrile. Pt states when she massages the 2 small areas, they seem to get smaller. Pt wanted to make sure the CT scans she is having on 3/24 would show if there is anything @ the 2 areas @ incisions. [phone].

## 2020-08-26 ENCOUNTER — Ambulatory Visit (INDEPENDENT_AMBULATORY_CARE_PROVIDER_SITE_OTHER): Payer: Medicare Other | Admitting: Allergy and Immunology

## 2020-08-26 ENCOUNTER — Other Ambulatory Visit: Payer: Self-pay

## 2020-08-26 ENCOUNTER — Encounter: Payer: Self-pay | Admitting: Allergy and Immunology

## 2020-08-26 VITALS — BP 130/70 | HR 76 | Resp 16

## 2020-08-26 DIAGNOSIS — K219 Gastro-esophageal reflux disease without esophagitis: Secondary | ICD-10-CM

## 2020-08-26 DIAGNOSIS — G4720 Circadian rhythm sleep disorder, unspecified type: Secondary | ICD-10-CM

## 2020-08-26 DIAGNOSIS — G2581 Restless legs syndrome: Secondary | ICD-10-CM

## 2020-08-26 DIAGNOSIS — J3089 Other allergic rhinitis: Secondary | ICD-10-CM | POA: Diagnosis not present

## 2020-08-26 DIAGNOSIS — K224 Dyskinesia of esophagus: Secondary | ICD-10-CM | POA: Diagnosis not present

## 2020-08-26 DIAGNOSIS — J4530 Mild persistent asthma, uncomplicated: Secondary | ICD-10-CM

## 2020-08-26 MED ORDER — CYPROHEPTADINE HCL 4 MG PO TABS: ORAL_TABLET | ORAL | 1 refills | Status: DC

## 2020-08-26 NOTE — Progress Notes (Signed)
[name]   Follow-up Note  Referring Provider: Garwin Brothers, MD Primary Provider: Garwin Brothers, MD Date of Office Visit: 08/26/2020  Subjective:   Holly Harper (DOB: August 01, 1945) is a 75 y.o. female who returns to the [facility] on 08/26/2020 in re-evaluation of the following:  HPI: Holly Harper presents to this clinic in evaluation of asthma and allergic rhinitis and LPR and esophageal dysmotility.  I last saw her in this clinic 29 June 2020 at which point in time we arranged for her to be seen by Dr. Lyndel Safe, GI, and increased her therapy for reflux/LPR, and treated her empirically for esophageal candidiasis with oral nystatin.  She still has discomfort swallowing even in the face of receiving a upper endoscopy with dilation and utilizing the therapy noted above.  There are some food she can eat and there is some foods that she cannot eat.  She cannot eat any type of dry food.  She can eat oily food.  If she eats dry food it is very discomforting for her to swallow  As well, she is having problems sleeping.  She only sleeps 2 hours at a time then she wakes up with "jumping feet" and sometimes some numbness of her knees and feet.  It was recommended that she try trazodone as a sleep aid but she has never utilize this agent.  She has had no problems with her airway and does not use a short acting bronchodilator.  Her nose is doing quite well.  Allergies as of 08/26/2020      Reactions   Amoxicillin Hives   Did it involve swelling of the face/tongue/throat, SOB, or low BP? No Did it involve sudden or severe rash/hives, skin peeling, or any reaction on the inside of your mouth or nose? Yes Did you need to seek medical attention at a hospital or doctor's office? Yes When did it last happen? ~2015 If all above answers are "NO", may proceed with cephalosporin use.   Cinobac [cinoxacin] Other (See Comments)   Unknown     Macrodantin [nitrofurantoin Macrocrystal] Hives   Semaglutide Nausea Only   rybelsus       Medication List      acetaminophen 325 MG tablet Commonly known as: TYLENOL Take 2 tablets (650 mg total) by mouth every 6 (six) hours as needed for mild pain.   albuterol 108 (90 Base) MCG/ACT inhaler Commonly known as: VENTOLIN HFA Inhale 2 puffs into the lungs every 6 (six) hours as needed for wheezing or shortness of breath.   aspirin EC 81 MG tablet Take 1 tablet (81 mg total) by mouth daily.   atenolol 50 MG tablet Commonly known as: TENORMIN TAKE 1 TABLET BY MOUTH EVERY DAY   atorvastatin 10 MG tablet Commonly known as: LIPITOR TAKE 1 TABLET BY MOUTH EVERYDAY AT BEDTIME   azelastine 0.1 % nasal spray Commonly known as: ASTELIN Place 1 spray into both nostrils 2 (two) times daily as needed for rhinitis. Use in each nostril as directed   cyproheptadine 4 MG tablet Commonly known as: PERIACTIN Take one-half tablet at bedtime. Started by: Jiles Prows, MD   famotidine 40 MG tablet Commonly known as: PEPCID TAKE 1 TABLET (40 MG TOTAL) BY MOUTH DAILY. CALL [phone] TO SCHEDULE AN OFFICE VISIT FOR MORE REFILLS   Farxiga 5 MG Tabs tablet Generic drug: dapagliflozin propanediol Take 5 mg by mouth daily.   fluticasone 27.5 MCG/SPRAY nasal spray Commonly  known as: VERAMYST Place 1 spray into the nose daily as needed for rhinitis.   glipiZIDE 5 MG 24 hr tablet Commonly known as: GLUCOTROL XL Take 5 mg by mouth daily with breakfast.   lidocaine-prilocaine cream Commonly known as: EMLA Apply topically.   LUBRICATING EYE DROPS OP Place 1 drop into both eyes daily as needed (dry eyes).   metFORMIN 500 MG 24 hr tablet Commonly known as: GLUCOPHAGE-XR Take 500 mg by mouth 3 (three) times daily.   montelukast 10 MG tablet Commonly known as: SINGULAIR TAKE 1 TABLET BY MOUTH EVERY DAY   NON FORMULARY Take 1,200 mg by mouth at bedtime as needed (Sleep). Delta 8 for  sleep   nystatin 100000 UNIT/ML suspension Commonly known as: MYCOSTATIN Swish and swallow 61ml by mouth three times daily for 10 days   ondansetron 4 MG tablet Commonly known as: ZOFRAN Take 4 mg by mouth every 4 (four) hours as needed.   pantoprazole 40 MG tablet Commonly known as: PROTONIX TAKE 1 TABLET BY MOUTH EVERY DAY   prochlorperazine 10 MG tablet Commonly known as: COMPAZINE Take 10 mg by mouth every 6 (six) hours as needed.   Systane Balance 0.6 % Soln Generic drug: Propylene Glycol 1 drop as needed.   traZODone 50 MG tablet Commonly known as: DESYREL Take 50 mg by mouth at bedtime as needed for sleep.   ZENPEP PO Take by mouth.       Past Medical History:  Diagnosis Date  . Allergic rhinitis   . Anxiety   . Asthma   . Diabetes ([place])   . Dysphagia   . Family history of adverse reaction to anesthesia    sister had PONV  . GERD (gastroesophageal reflux disease)   . Hiatal hernia   . Hypercholesterolemia   . Hypertension   . Lung cancer ([place]) 2020  . Pancreatic cancer ([facility]) 2021  . Paresthesia     Past Surgical History:  Procedure Laterality Date  . COLONOSCOPY  02/10/2016   Mild pancolonic diverticulosis. Otherwise, normal colonoscopy  . ESOPHAGOGASTRODUODENOSCOPY  08/24/2011   Prebyesophagus. Mininal hiatal hernia. Mild gastritis  . ESOPHAGOGASTRODUODENOSCOPY (EGD) WITH PROPOFOL N/A 11/14/2019   Procedure: ESOPHAGOGASTRODUODENOSCOPY (EGD) WITH PROPOFOL;  Surgeon: [name], MD;  Location: [facility];  Service: Endoscopy;  Laterality: N/A;  . EUS N/A 11/14/2019   Procedure: UPPER ENDOSCOPIC ULTRASOUND (EUS) RADIAL;  Surgeon: [name], MD;  Location: [facility];  Service: Endoscopy;  Laterality: N/A;  . FINE NEEDLE ASPIRATION N/A 11/14/2019   Procedure: FINE NEEDLE ASPIRATION (FNA) LINEAR;  Surgeon: [name], MD;  Location: [facility];  Service: Endoscopy;  Laterality: N/A;  . INTERCOSTAL NERVE BLOCK Right 05/09/2019    Procedure: Intercostal Nerve Block thoracic;  Surgeon: Melrose Nakayama, MD;  Location: [facility];  Service: Thoracic;  Laterality: Right;  . LASIK Bilateral   . LIPOMA RESECTION    . LOBECTOMY Right 05/09/2019   Procedure: LOBECTOMY;  Surgeon: Melrose Nakayama, MD;  Location: [facility];  Service: Thoracic;  Laterality: Right;  . UPPER GASTROINTESTINAL ENDOSCOPY  2008  . UPPER GASTROINTESTINAL ENDOSCOPY  07/21/2020  . VIDEO ASSISTED THORACOSCOPY (VATS)/WEDGE RESECTION Right 05/09/2019   Procedure: VIDEO ASSISTED THORACOSCOPY (VATS)/WEDGE RESECTION FOR FROZEN SECTIONS ;  Surgeon: Melrose Nakayama, MD;  Location: [facility];  Service: Thoracic;  Laterality: Right;  . WHIPPLE PROCEDURE  04/30/2020    Review of systems negative except as noted in HPI / PMHx or noted below:  Review of Systems  Constitutional: Negative.   HENT: Negative.   Eyes: Negative.   Respiratory: Negative.   Cardiovascular: Negative.   Gastrointestinal: Negative.   Genitourinary: Negative.   Musculoskeletal: Negative.   Skin: Negative.   Neurological: Negative.   Endo/Heme/Allergies: Negative.   Psychiatric/Behavioral: Negative.      Objective:   Vitals:   08/26/20 1342  BP: 130/70  Pulse: 76  Resp: 16  SpO2: 98%          Physical Exam Constitutional:      Appearance: She is not diaphoretic.  HENT:     Head: Normocephalic.     Right Ear: Tympanic membrane, ear canal and external ear normal.     Left Ear: Tympanic membrane, ear canal and external ear normal.     Nose: Nose normal. No mucosal edema or rhinorrhea.     Mouth/Throat:     Mouth: Oropharynx is clear and moist and mucous membranes are normal.     Pharynx: Uvula midline. No oropharyngeal exudate.  Eyes:     Conjunctiva/sclera: Conjunctivae normal.  Neck:     Thyroid: No thyromegaly.     Trachea: Trachea normal. No tracheal tenderness or tracheal deviation.  Cardiovascular:     Rate and Rhythm: Normal rate and regular rhythm.      Heart sounds: Normal heart sounds, S1 normal and S2 normal. No murmur heard.   Pulmonary:     Effort: No respiratory distress.     Breath sounds: Normal breath sounds. No stridor. No wheezing or rales.  Musculoskeletal:        General: No edema.  Lymphadenopathy:     Head:     Right side of head: No tonsillar adenopathy.     Left side of head: No tonsillar adenopathy.     Cervical: No cervical adenopathy.  Skin:    Findings: No erythema or rash.     Nails: There is no clubbing.  Neurological:     Mental Status: She is alert.     Diagnostics: none  Assessment and Plan:   1. Asthma, well controlled, mild persistent   2. Other allergic rhinitis   3. LPRD (laryngopharyngeal reflux disease)   4. Esophageal dysmotility   5. Dysfunction of sleep stage or arousal   6. Restless leg     1.  Continue pantoprazole 40 mg in AM + famotidine 40 mg in evening   2.  Continue montelukast 10 mg daily   3.  Continue Flonase + azelastine 1 spray each nostril 1-2 times per day during periods of upper airway symptoms.   4. Continue ProAir HFA 2 puffs every 4-6 hours if needed  5. START OTC magnesium 2 times per day (around 500 mg tablets)  6. START Periactin 4 mg tablet - 1/2 tablet at bedtime  7. Only eat foods that are easy to eat. No dry foods.   8. Return to clinic in 4 weeks or earlier if problem  I am going to give Keileigh start Periactin at bedtime to help with her sleep and also maybe to stimulate her appetite somewhat.  As well, she appears to have some muscle irritability and I suspect that she is not really eating correctly as she has lost a fair amount of weight and I have recommended that she use magnesium supplementation twice a day.  She may require a more specific therapy for restless leg syndrome pending her response to therapy noted above.  I did have a long talk with her today about what foods to  eat and what foods not to eat and it appears that she can only eat foods that  are relatively moist or oily.  She will continue on therapy for her reflux and inflammation of her airway as noted.  [name], MD Allergy / Immun[facility]

## 2020-08-26 NOTE — Patient Instructions (Addendum)
  1.  Continue pantoprazole 40 mg in AM + famotidine 40 mg in evening   2.  Continue montelukast 10 mg daily   3.  Continue Flonase + azelastine 1 spray each nostril 1-2 times per day during periods of upper airway symptoms.   4. Continue ProAir HFA 2 puffs every 4-6 hours if needed  5. START OTC magnesium 2 times per day (around 500 mg tablets)  6. START Periactin 4 mg tablet - 1/2 tablet at bedtime  7. Only eat foods that are easy to eat. No dry foods.   8. Return to clinic in 4 weeks or earlier if problem

## 2020-08-27 ENCOUNTER — Encounter: Payer: Self-pay | Admitting: Allergy and Immunology

## 2020-09-02 ENCOUNTER — Telehealth: Payer: Self-pay | Admitting: Oncology

## 2020-09-02 NOTE — Telephone Encounter (Signed)
09/02/20 Spoke with patient and rescheduled appt.

## 2020-09-08 ENCOUNTER — Encounter: Payer: Self-pay | Admitting: Oncology

## 2020-09-10 ENCOUNTER — Encounter: Payer: Self-pay | Admitting: Oncology

## 2020-09-10 NOTE — Telephone Encounter (Signed)
Still having problems with dysphagia despite negative barium swallow/EGD with dil on PPIs Also complaining of " phlegm in the throat"-could have sinus problems as well.  Plan: -Can we please set her up with ENT here in [place] for any recommendations. -If still with problems, would need esophageal manometry.  RG

## 2020-09-11 ENCOUNTER — Other Ambulatory Visit: Payer: Self-pay | Admitting: Gastroenterology

## 2020-09-11 ENCOUNTER — Telehealth: Payer: Self-pay

## 2020-09-11 ENCOUNTER — Ambulatory Visit: Payer: Medicare Other | Admitting: Oncology

## 2020-09-11 NOTE — Telephone Encounter (Signed)
Left detailed message for pt regarding referral to ENT and that she should hear from their office regarding an appt.

## 2020-09-11 NOTE — Telephone Encounter (Addendum)
Per Lenna Sciara P,NP:  I think that CT on [place] should be handled by her surgical team.    From Rosco Harriott, RN:  I spoke with [facility]. @ [facility].  Report findings:  Post Whipple procedure with thickening of stomach and adjacent small bowel @ the anastomotic level. Correlate with any symptoms of gastritis or abdominal pain or symptoms suggesting developing marginal ulcer. No substantial stranding or acute findings @ this time.   Above message sent to Almira Bar, NP.

## 2020-09-14 NOTE — Progress Notes (Signed)
Holly Harper  [address] [place],  [place]  [address] [phone]  Clinic Day:  09/15/2020  Referring physician: Garwin Brothers, MD   HISTORY OF PRESENT ILLNESS:  The patient is a 75 y.o. female with stage IA (T1 N0 M0) pancreatic adenocarcinoma.  After 5 cycles of neoadjuvant FOLFIRINOX chemotherapy, she underwent a Whipple procedure in November 2021, whose pathology revealed an 8 mm moderately differentiated pancreatic adenocarcinoma.  2 lymph nodes were removed, for which neither contained cancer.  Since this surgery, the patient has been recuperating well.  She comes in today to go over her CT scans to ascertain her new disease baseline.  As it pertains to her pancreatic cancer, she denies having early satiety, abdominal discomfort or other symptoms which concern her for disease recurrence.  She does take pancrease, which helps immensely with her being able to better digest her food.  Of note, this patient also has a history of stage IA3 (T1c N0 M0) lung adenocarcinoma, status post a right upper lobectomy in November 2020.  She denies having shortness of breath, hemoptysis, or other respiratory issues which concern her for early disease recurrence.  PHYSICAL EXAM:  Blood pressure 128/69, pulse 71, temperature (!) 97.4 F (36.3 C), resp. rate 14, height 5\' 3"  (1.6 m), weight 93 lb 9.6 oz (42.5 kg), SpO2 96 %. Wt Readings from Last 3 Encounters:  09/15/20 93 lb 9.6 oz (42.5 kg)  08/14/20 93 lb 12.8 oz (42.5 kg)  07/21/20 95 lb (43.1 kg)   Body mass index is 16.58 kg/m. Performance status (ECOG): 1 Physical Exam Constitutional:      Appearance: Normal appearance. She is not ill-appearing.  HENT:     Mouth/Throat:     Mouth: Mucous membranes are moist.     Pharynx: Oropharynx is clear. No oropharyngeal exudate or posterior oropharyngeal erythema.  Cardiovascular:     Rate and Rhythm: Normal rate and regular rhythm.     Heart sounds: No murmur  heard. No friction rub. No gallop.   Pulmonary:     Effort: Pulmonary effort is normal. No respiratory distress.     Breath sounds: Normal breath sounds. No wheezing, rhonchi or rales.  Chest:  Breasts:     Right: No axillary adenopathy or supraclavicular adenopathy.     Left: No axillary adenopathy or supraclavicular adenopathy.    Abdominal:     General: Bowel sounds are normal. There is no distension.     Palpations: Abdomen is soft. There is no mass.     Tenderness: There is no abdominal tenderness.  Musculoskeletal:        General: No swelling.     Right lower leg: No edema.     Left lower leg: No edema.  Lymphadenopathy:     Cervical: No cervical adenopathy.     Upper Body:     Right upper body: No supraclavicular or axillary adenopathy.     Left upper body: No supraclavicular or axillary adenopathy.     Lower Body: No right inguinal adenopathy. No left inguinal adenopathy.  Skin:    General: Skin is warm.     Coloration: Skin is not jaundiced.     Findings: No lesion or rash.  Neurological:     General: No focal deficit present.     Mental Status: She is alert and oriented to person, place, and time. Mental status is at baseline.     Cranial Nerves: Cranial nerves are intact.  Psychiatric:  Mood and Affect: Mood normal.        Behavior: Behavior normal.        Thought Content: Thought content normal.   SCANS:  CT scans of her chest/abdomen/pelvis revealed the following: FINDINGS: Lower chest: Scarring in the RIGHT middle lobe. Partial lung resection changes are incompletely imaged. No effusion. No consolidation. Extensive coronary artery disease, three-vessel disease. Heart is incompletely imaged. No pericardial effusion.  Hepatobiliary: Hepatic steatosis. Post biliary and pancreatic diversion following Whipple procedure. No pneumobilia. No suspicious hepatic lesion. Portal vein is patent. Hepatic veins are patent.  Pancreas: Post Whipple procedure.  Peripheral pancreatic atrophy. Pancreatico biliary limb with small linear filling defect measuring 3.4 cm, potentially stent that was present in the pancreatic duct post Whipple. No peripancreatic stranding.  Spleen: Normal  Adrenals/Urinary Tract: Adrenal glands are normal.  Cyst in the upper pole the RIGHT kidney. Otherwise with symmetric renal enhancement. No hydronephrosis. Urinary bladder with smooth contours but under distended limiting assessment.  Stomach/Bowel: Signs of gastrojejunostomy. Mild thickening of the stomach in this area. No significant stranding. No sign of bowel obstruction. Ingested contrast media has passed into the distal small bowel and further into the colon. Pancreatico biliary limb passes into the RIGHT upper quadrant as expected. No stranding about this loop of bowel. Filling defect suspected to represent a stent that was placed at the time of surgery across the pancreatic duct anastomosis.  Vascular/Lymphatic: Calcified and noncalcified atheromatous plaque of the abdominal aorta. No aneurysmal dilation. Patent abdominal vasculature including the portal vein. No abdominal lymphadenopathy.  No pelvic lymphadenopathy.  Patent pelvic vasculature.  Reproductive: Unremarkable by CT.  Other: No free air. No ascites.  Musculoskeletal: Spinal degenerative changes. No acute or destructive bone process.  IMPRESSION: 1. Post Whipple procedure with thickening of the stomach and adjacent small bowel at the anastomotic level. Correlate with any symptoms of gastritis/abdominal pain or symptoms that would suggest developing marginal ulceration. No substantial stranding or other acute findings at this time. 2. Retained stent in the pancreaticobiliary limb, can be seen following Whipple procedure. Correlate with operative history. No stranding to suggest acute process associated with this finding. Attention on follow-up 3. Hepatic steatosis. 4. Extensive  coronary artery disease, three-vessel disease. 5. Aortic atherosclerosis.  ASSESSMENT & PLAN:  Assessment/Plan:  A 75 y.o. female with stage IA (ypT1 N0 M0) pancreatic cancer, status post 5 cycles of neoadjuvant FOLFIRINOX chemotherapy, followed by a pylorus-sparing Whipple procedure in November 2021.  In clinic today, I reviewed her recent CT scans with her, for which she could see there remains no evidence of disease recurrence.  Clinically, the patient continues to do very well.  I will see her back in 4 months for repeat clinical assessment.  Repeat CT scans of her abdomen/pelvis will be done a day before her next visit to ensure she remains disease free.  The patient understands all the plans discussed today and is in agreement with them.    Roseland Braun Macarthur Critchley, MD

## 2020-09-14 NOTE — Progress Notes (Incomplete)
[facility]  [address] [place],  [place]  [address] [phone]  Clinic Day:  09/14/2020  Referring physician: Garwin Brothers, MD   HISTORY OF PRESENT ILLNESS:  The patient is a 75 y.o. female with stage IA (T1 N0 M0) pancreatic adenocarcinoma.  CT scans after 5 cycles of neoadjuvant FOLFIRINOX chemotherapy showed no evidence of disease progression.  Her tumor was essentially stable at 9 mm.  Ultimately, the plan was for her to receive 6 cycles of neoadjuvant chemotherapy.  However, she was doing so poorly after her 5th cycle that the decision was made to forego additional cycles of treatment.  Since her last visit, the patient did undergo a Whipple procedure in November 2021, whose pathology revealed an 8 mm moderately differentiated pancreatic adenocarcinoma.  2 lymph nodes were removed, for which neither contained cancer.  Since this surgery, the patient has recuperated fairly well.  As it pertains to her pancreatic cancer, she denies having early satiety, abdominal discomfort or other symptoms which concern her for overt signs of disease progression.  She does take pancrease, which has helped immensely with him being able to better digest foods.  Of note, this patient also has a history of stage IA3 (T1c N0 M0) lung adenocarcinoma, status post a right upper lobectomy in November 2020.  She denies having shortness of breath, hemoptysis, or other respiratory issues which concern her for early disease recurrence.   PHYSICAL EXAM:  There were no vitals taken for this visit. Wt Readings from Last 3 Encounters:  08/14/20 93 lb 12.8 oz (42.5 kg)  07/21/20 95 lb (43.1 kg)  07/20/20 95 lb (43.1 kg)   There is no height or weight on file to calculate BMI. Performance status (ECOG): 1 Physical Exam Constitutional:      Appearance: Normal appearance. She is not ill-appearing.  HENT:     Mouth/Throat:     Mouth: Mucous membranes are moist.     Pharynx:  Oropharynx is clear. No oropharyngeal exudate or posterior oropharyngeal erythema.  Cardiovascular:     Rate and Rhythm: Normal rate and regular rhythm.     Heart sounds: No murmur heard. No friction rub. No gallop.   Pulmonary:     Effort: Pulmonary effort is normal. No respiratory distress.     Breath sounds: Normal breath sounds. No wheezing, rhonchi or rales.  Chest:  Breasts:     Right: No axillary adenopathy or supraclavicular adenopathy.     Left: No axillary adenopathy or supraclavicular adenopathy.    Abdominal:     General: Bowel sounds are normal. There is no distension.     Palpations: Abdomen is soft. There is no mass.     Tenderness: There is no abdominal tenderness.  Musculoskeletal:        General: No swelling.     Right lower leg: No edema.     Left lower leg: No edema.  Lymphadenopathy:     Cervical: No cervical adenopathy.     Upper Body:     Right upper body: No supraclavicular or axillary adenopathy.     Left upper body: No supraclavicular or axillary adenopathy.     Lower Body: No right inguinal adenopathy. No left inguinal adenopathy.  Skin:    General: Skin is warm.     Coloration: Skin is not jaundiced.     Findings: No lesion or rash.  Neurological:     General: No focal deficit present.     Mental Status: She  is alert and oriented to person, place, and time. Mental status is at baseline.     Cranial Nerves: Cranial nerves are intact.  Psychiatric:        Mood and Affect: Mood normal.        Behavior: Behavior normal.        Thought Content: Thought content normal.     LABS:   CBC Latest Ref Rng & Units 06/30/2020 05/11/2019 05/10/2019  WBC 4.0 - 10.5 K/uL 7.0 10.2 10.1  Hemoglobin 12.0 - 15.0 g/dL 12.4 8.5(L) 9.3(L)  Hematocrit 36.0 - 46.0 % 38.4 25.7(L) 27.5(L)  Platelets 150.0 - 400.0 K/uL 228.0 150 175   CMP Latest Ref Rng & Units 06/30/2020 05/11/2019 05/10/2019  Glucose 70 - 99 mg/dL 162(H) 207(H) 161(H)  BUN 6 - 23 mg/dL 17 14 13    Creatinine 0.40 - 1.20 mg/dL 0.67 0.91 0.74  Sodium 135 - 145 mEq/L 137 135 134(L)  Potassium 3.5 - 5.1 mEq/L 4.1 4.2 4.5  Chloride 96 - 112 mEq/L 103 102 103  CO2 19 - 32 mEq/L 27 23 21(L)  Calcium 8.4 - 10.5 mg/dL 9.8 8.8(L) 8.5(L)  Total Protein 6.0 - 8.3 g/dL 6.9 - -  Total Bilirubin 0.2 - 1.2 mg/dL 0.5 - -  Alkaline Phos 39 - 117 U/L 72 - -  AST 0 - 37 U/L 32 - -  ALT 0 - 35 U/L 30 - -   ASSESSMENT & PLAN:  Assessment/Plan:  A 75 y.o. female with stage IA (ypT1 N0 M0) pancreatic cancer, status post 5 cycles of neoadjuvant FOLFIRINOX chemotherapy, followed by a pylorus-sparing Whipple procedure in November 2021.  In clinic today, I reviewed her recent surgical pathology with her, for which she could see that her disease remained very small and confined, with no evidence of metastasis.  Moving forward, her plan of care will now switch to surveillance only.  I will see her back in 2 months for repeat clinical assessment.  CT scans of her abdomen/pelvis will be done a day before her next visit to ascertain her new disease baseline 4 months after her Whipple procedure.  The patient understands all the plans discussed today and is in agreement with them.    Dequincy Macarthur Critchley, MD

## 2020-09-15 ENCOUNTER — Inpatient Hospital Stay: Payer: Medicare Other | Attending: Oncology | Admitting: Oncology

## 2020-09-15 ENCOUNTER — Other Ambulatory Visit: Payer: Self-pay

## 2020-09-15 ENCOUNTER — Telehealth: Payer: Self-pay | Admitting: Oncology

## 2020-09-15 VITALS — BP 128/69 | HR 71 | Temp 97.4°F | Resp 14 | Ht 63.0 in | Wt 93.6 lb

## 2020-09-15 DIAGNOSIS — C259 Malignant neoplasm of pancreas, unspecified: Secondary | ICD-10-CM | POA: Diagnosis not present

## 2020-09-15 NOTE — Telephone Encounter (Signed)
Per 3/29 los next appt,ct scans scheduled and given to patient

## 2020-09-17 ENCOUNTER — Other Ambulatory Visit: Payer: Self-pay | Admitting: Allergy and Immunology

## 2020-09-18 ENCOUNTER — Other Ambulatory Visit: Payer: Self-pay | Admitting: Allergy and Immunology

## 2020-09-18 ENCOUNTER — Other Ambulatory Visit: Payer: Self-pay | Admitting: Gastroenterology

## 2020-09-23 ENCOUNTER — Other Ambulatory Visit: Payer: Self-pay

## 2020-09-23 ENCOUNTER — Ambulatory Visit (INDEPENDENT_AMBULATORY_CARE_PROVIDER_SITE_OTHER): Payer: Medicare Other | Admitting: Allergy and Immunology

## 2020-09-23 ENCOUNTER — Encounter: Payer: Self-pay | Admitting: Allergy and Immunology

## 2020-09-23 VITALS — BP 124/70 | HR 80 | Resp 16

## 2020-09-23 DIAGNOSIS — J3089 Other allergic rhinitis: Secondary | ICD-10-CM | POA: Diagnosis not present

## 2020-09-23 DIAGNOSIS — K224 Dyskinesia of esophagus: Secondary | ICD-10-CM | POA: Diagnosis not present

## 2020-09-23 DIAGNOSIS — K219 Gastro-esophageal reflux disease without esophagitis: Secondary | ICD-10-CM

## 2020-09-23 DIAGNOSIS — J4530 Mild persistent asthma, uncomplicated: Secondary | ICD-10-CM | POA: Diagnosis not present

## 2020-09-23 MED ORDER — BUDESONIDE 0.5 MG/2ML IN SUSP: RESPIRATORY_TRACT | 5 refills | Status: DC

## 2020-09-23 NOTE — Patient Instructions (Addendum)
  1.  Continue pantoprazole 40 mg in AM + famotidine 40 mg in evening   2.  Continue montelukast 10 mg daily   3.  Continue Flonase + azelastine 1 spray each nostril 1-2 times per day during periods of upper airway symptoms.   4. Continue ProAir HFA 2 puffs every 4-6 hours if needed  5. Continue OTC magnesium 1 time per day  6. START budesonide 0.5 mg slurry (1 ampule in 10 Splenda packs) daily  7. Return to clinic in 8 weeks or earlier if problem

## 2020-09-23 NOTE — Progress Notes (Signed)
[facility]   Follow-up Note  Referring Provider: Garwin Brothers, MD Primary Provider: Garwin Brothers, MD Date of Office Visit: 09/23/2020  Subjective:   Blase Mess (DOB: [birth year]) is a 75 y.o. female who returns to the [facility] on 09/23/2020 in re-evaluation of the following:  HPI: Germaine returns to this clinic in evaluation of asthma and allergic rhinitis and LPR and esophageal dysmotility.  Her last visit to this clinic was 26 August 2020.  During her last visit we made the recommendation that she try Periactin in an attempt to increase her appetite and also help with her sleep dysfunction.  She found that Periactin gave rise to some issues with hyperactivity and she is now using trazodone to sleep and she is sleeping better.  Her appetite is actually okay.  The big problem for her is that she still has this discomfort when she swallows.  But she has been eating more aiming for as much as she can given this discomfort.  She had a barium swallow and modified barium swallow which have not identified any aspiration but there is appears to be some esophageal dysmotility.  She has had an upper endoscopy which has not identified any degree of reflux disease occurring while utilizing very aggressive antireflux therapy.  She does use pancreatic enzymes at this point in time.  Her airway issue is not an issue at this point in time and she has no need to use a short acting bronchodilator.  During her last visit she was also having some issues with "jumping feet" and soreness in her knees and feet.  She started magnesium supplementation and this is much better at this point in time.  Allergies as of 09/23/2020      Reactions   Amoxicillin Hives   Did it involve swelling of the face/tongue/throat, SOB, or low BP? No Did it involve sudden or severe rash/hives, skin peeling, or any reaction on the inside of your mouth or nose? Yes Did  you need to seek medical attention at a hospital or doctor's office? Yes When did it last happen? ~2015 If all above answers are "NO", may proceed with cephalosporin use.   Cinobac [cinoxacin] Other (See Comments)   Unknown    Macrodantin [nitrofurantoin Macrocrystal] Hives   Semaglutide Nausea Only   rybelsus       Medication List    acetaminophen 325 MG tablet Commonly known as: TYLENOL Take 2 tablets (650 mg total) by mouth every 6 (six) hours as needed for mild pain.   albuterol 108 (90 Base) MCG/ACT inhaler Commonly known as: VENTOLIN HFA Inhale 2 puffs into the lungs every 6 (six) hours as needed for wheezing or shortness of breath.   aspirin EC 81 MG tablet Take 1 tablet (81 mg total) by mouth daily.   atenolol 50 MG tablet Commonly known as: TENORMIN TAKE 1 TABLET BY MOUTH EVERY DAY   atorvastatin 10 MG tablet Commonly known as: LIPITOR TAKE 1 TABLET BY MOUTH EVERYDAY AT BEDTIME   azelastine 0.1 % nasal spray Commonly known as: ASTELIN Place 1 spray into both nostrils 2 (two) times daily as needed for rhinitis. Use in each nostril as directed   famotidine 40 MG tablet Commonly known as: PEPCID TAKE 1 TABLET (40 MG TOTAL) BY MOUTH DAILY. CALL [phone] TO SCHEDULE AN OFFICE VISIT FOR MORE REFILLS   Farxiga 5 MG Tabs tablet Generic drug: dapagliflozin propanediol Take 5 mg by  mouth daily.   fluticasone 27.5 MCG/SPRAY nasal spray Commonly known as: VERAMYST Place 1 spray into the nose daily as needed for rhinitis.   glipiZIDE 5 MG 24 hr tablet Commonly known as: GLUCOTROL XL Take 5 mg by mouth daily with breakfast.   metFORMIN 500 MG 24 hr tablet Commonly known as: GLUCOPHAGE-XR Take 500 mg by mouth 3 (three) times daily.   montelukast 10 MG tablet Commonly known as: SINGULAIR TAKE 1 TABLET BY MOUTH EVERY DAY   pantoprazole 40 MG tablet Commonly known as: PROTONIX TAKE 1 TABLET BY MOUTH EVERY DAY   Systane Balance 0.6 % Soln Generic drug:  Propylene Glycol 1 drop as needed.   traZODone 50 MG tablet Commonly known as: DESYREL Take 50 mg by mouth at bedtime as needed for sleep.   ZENPEP PO Take by mouth.       Past Medical History:  Diagnosis Date  . Allergic rhinitis   . Anxiety   . Asthma   . Diabetes ([place])   . Dysphagia   . Family history of adverse reaction to anesthesia    sister had PONV  . GERD (gastroesophageal reflux disease)   . Hiatal hernia   . Hypercholesterolemia   . Hypertension   . Lung cancer ([place]) 2020  . Pancreatic cancer ([place]) 2021  . Paresthesia     Past Surgical History:  Procedure Laterality Date  . COLONOSCOPY  02/10/2016   Mild pancolonic diverticulosis. Otherwise, normal colonoscopy  . ESOPHAGOGASTRODUODENOSCOPY  08/24/2011   Prebyesophagus. Mininal hiatal hernia. Mild gastritis  . ESOPHAGOGASTRODUODENOSCOPY (EGD) WITH PROPOFOL N/A 11/14/2019   Procedure: ESOPHAGOGASTRODUODENOSCOPY (EGD) WITH PROPOFOL;  Surgeon: [name], MD;  [facility];  Service: Endoscopy;  Laterality: N/A;  . EUS N/A 11/14/2019   Procedure: UPPER ENDOSCOPIC ULTRASOUND (EUS) RADIAL;  Surgeon: [name], MD;  [facility];  Service: Endoscopy;  Laterality: N/A;  . FINE NEEDLE ASPIRATION N/A 11/14/2019   Procedure: FINE NEEDLE ASPIRATION (FNA) LINEAR;  Surgeon: [name], MD;  [facility];  Service: Endoscopy;  Laterality: N/A;  . INTERCOSTAL NERVE BLOCK Right 05/09/2019   Procedure: Intercostal Nerve Block thoracic;  Surgeon: Melrose Nakayama, MD;  Location: [facility];  Service: Thoracic;  Laterality: Right;  . LASIK Bilateral   . LIPOMA RESECTION    . LOBECTOMY Right 05/09/2019   Procedure: LOBECTOMY;  Surgeon: Melrose Nakayama, MD;  Location: [facility];  Service: Thoracic;  Laterality: Right;  . UPPER GASTROINTESTINAL ENDOSCOPY  2008  . UPPER GASTROINTESTINAL ENDOSCOPY  07/21/2020  . VIDEO ASSISTED THORACOSCOPY (VATS)/WEDGE RESECTION Right 05/09/2019    Procedure: VIDEO ASSISTED THORACOSCOPY (VATS)/WEDGE RESECTION FOR FROZEN SECTIONS ;  Surgeon: Melrose Nakayama, MD;  Location: [facility];  Service: Thoracic;  Laterality: Right;  . WHIPPLE PROCEDURE  04/30/2020    Review of systems negative except as noted in HPI / PMHx or noted below:  Review of Systems  Constitutional: Negative.   HENT: Negative.   Eyes: Negative.   Respiratory: Negative.   Cardiovascular: Negative.   Gastrointestinal: Negative.   Genitourinary: Negative.   Musculoskeletal: Negative.   Skin: Negative.   Neurological: Negative.   Endo/Heme/Allergies: Negative.   Psychiatric/Behavioral: Negative.      Objective:   Vitals:   09/23/20 1343  BP: 124/70  Pulse: 80  Resp: 16  SpO2: 96%          Physical Exam  Diagnostics: none  Assessment and Plan:   1. Asthma, well controlled, mild persistent  2. Other allergic rhinitis   3. LPRD (laryngopharyngeal reflux disease)   4. Esophageal dysmotility     1.  Continue pantoprazole 40 mg in AM + famotidine 40 mg in evening   2.  Continue montelukast 10 mg daily   3.  Continue Flonase + azelastine 1 spray each nostril 1-2 times per day during periods of upper airway symptoms.   4. Continue ProAir HFA 2 puffs every 4-6 hours if needed  5. Continue OTC magnesium 1 time per day  6. START budesonide 0.5 mg slurry (1 ampule in 10 Splenda packs) daily  7. Return to clinic in 8 weeks or earlier if problem  I will start Olar on a budesonide slurry that she can swallow in case there is some inflammation of her esophagus and we should know if this will give rise to improvement over the course of the next 4 to 8 weeks.  She will continue on all of her other medications directed against respiratory tract inflammation and reflux as noted above and also continue on magnesium as this is given rise to some significant improvement regarding her musculoskeletal issues.  [name], MD Allergy / Immunology [facility]

## 2020-09-24 ENCOUNTER — Encounter: Payer: Self-pay | Admitting: Allergy and Immunology

## 2020-10-08 ENCOUNTER — Other Ambulatory Visit: Payer: Self-pay | Admitting: Gastroenterology

## 2020-10-12 ENCOUNTER — Other Ambulatory Visit: Payer: Self-pay | Admitting: Allergy and Immunology

## 2020-10-13 ENCOUNTER — Other Ambulatory Visit: Payer: Self-pay | Admitting: Allergy and Immunology

## 2020-11-25 ENCOUNTER — Other Ambulatory Visit: Payer: Self-pay

## 2020-11-25 ENCOUNTER — Encounter: Payer: Self-pay | Admitting: Allergy and Immunology

## 2020-11-25 ENCOUNTER — Ambulatory Visit (INDEPENDENT_AMBULATORY_CARE_PROVIDER_SITE_OTHER): Payer: Medicare Other | Admitting: Allergy and Immunology

## 2020-11-25 VITALS — BP 104/62 | HR 64 | Temp 97.3°F | Resp 12

## 2020-11-25 DIAGNOSIS — K224 Dyskinesia of esophagus: Secondary | ICD-10-CM

## 2020-11-25 DIAGNOSIS — K219 Gastro-esophageal reflux disease without esophagitis: Secondary | ICD-10-CM

## 2020-11-25 DIAGNOSIS — J3089 Other allergic rhinitis: Secondary | ICD-10-CM

## 2020-11-25 DIAGNOSIS — J4530 Mild persistent asthma, uncomplicated: Secondary | ICD-10-CM | POA: Diagnosis not present

## 2020-11-25 NOTE — Patient Instructions (Signed)
  1.  Continue pantoprazole 40 mg in AM + famotidine 40 mg in evening   2.  Continue montelukast 10 mg daily   3.  Continue Flonase + azelastine 1 spray each nostril 1-2 times per day during periods of upper airway symptoms.   4. Continue ProAir HFA 2 puffs every 4-6 hours if needed  5. Continue OTC magnesium 1 time per day  6. Continue budesonide 0.5 mg slurry (1 ampule in teaspoon honey) daily  7. Return to clinic in 12 weeks or earlier if problem

## 2020-11-25 NOTE — Progress Notes (Signed)
[facility]   Follow-up Note  Referring Provider: Garwin Brothers, MD Primary Provider: Garwin Brothers, MD Date of Office Visit: 11/25/2020  Subjective:   Holly Harper (DOB: [birth year]) is a 75 y.o. female who returns to the [facility] on 11/25/2020 in re-evaluation of the following:  HPI: Livian presents to this clinic in evaluation of asthma and allergic rhinitis and LPR and esophageal dysmotility.  Her last visit to this clinic was 23 September 2020.  During her last visit we started her on swallowed budesonide slurry.  This has helped the swallowing difficulty she was having and the discomfort she was having whenever she ate which was impeding her ability to acquire enough calories.  It has also helped her throat.  She does have a taste perversion when using the budesonide with Stevia.  Her airway issue has not been a particularly big issue and she does not need to use any short acting bronchodilator and overall feels pretty good with both her nose and chest.  Her classic reflux symptoms are under excellent control.  She continues to use magnesium for the soreness she still suffers in her lower extremities which is working quite well.  She has become somewhat depressed in the past few weeks.  She just does not feel as though she knows "what to do".  She is not very satisfied when she is at home and when she goes out of the house in an attempt to do something she is not very satisfied with that situation as well.  This whole issue appears to correlate with the fact that her son-in-law had apparently had a will drawn up by her late husband leaving the entire estate to the son-in-law.  She just found out about this 2 weeks ago and she is very shocked about this issue.  Of course, in the state of [place] she is entitled to 50% of the estate so that issue will be worked out in time but she is still very shocked about the whole  situation.  Allergies as of 11/25/2020       Reactions   Amoxicillin Hives   Did it involve swelling of the face/tongue/throat, SOB, or low BP? No Did it involve sudden or severe rash/hives, skin peeling, or any reaction on the inside of your mouth or nose? Yes Did you need to seek medical attention at a hospital or doctor's office? Yes When did it last happen? ~2015 If all above answers are "NO", may proceed with cephalosporin use.   Cinobac [cinoxacin] Other (See Comments)   Unknown    Macrodantin [nitrofurantoin Macrocrystal] Hives   Semaglutide Nausea Only   rybelsus         Medication List     acetaminophen 325 MG tablet Commonly known as: TYLENOL Take 2 tablets (650 mg total) by mouth every 6 (six) hours as needed for mild pain.   albuterol 108 (90 Base) MCG/ACT inhaler Commonly known as: VENTOLIN HFA Inhale 2 puffs into the lungs every 6 (six) hours as needed for wheezing or shortness of breath.   aspirin EC 81 MG tablet Take 1 tablet (81 mg total) by mouth daily.   atenolol 50 MG tablet Commonly known as: TENORMIN TAKE 1 TABLET BY MOUTH EVERY DAY   atorvastatin 10 MG tablet Commonly known as: LIPITOR TAKE 1 TABLET BY MOUTH EVERYDAY AT BEDTIME   azelastine 0.1 % nasal spray Commonly known as: ASTELIN Place 1 spray  into both nostrils 2 (two) times daily as needed for rhinitis. Use in each nostril as directed   budesonide 0.5 MG/2ML nebulizer solution Commonly known as: PULMICORT Mix the contents of one ampule with ten packets of Stevia and take by mouth once daily.   famotidine 40 MG tablet Commonly known as: PEPCID TAKE 1 TABLET (40 MG TOTAL) BY MOUTH DAILY. CALL [phone] TO SCHEDULE AN OFFICE VISIT FOR MORE REFILLS   Farxiga 5 MG Tabs tablet Generic drug: dapagliflozin propanediol Take 5 mg by mouth daily.   fluticasone 27.5 MCG/SPRAY nasal spray Commonly known as: VERAMYST Place 1 spray into the nose daily as needed for rhinitis.   glipiZIDE 5  MG 24 hr tablet Commonly known as: GLUCOTROL XL Take 5 mg by mouth daily with breakfast.   metFORMIN 500 MG 24 hr tablet Commonly known as: GLUCOPHAGE-XR Take 500 mg by mouth 3 (three) times daily.   montelukast 10 MG tablet Commonly known as: SINGULAIR TAKE 1 TABLET BY MOUTH EVERY DAY   pantoprazole 40 MG tablet Commonly known as: PROTONIX TAKE 1 TABLET BY MOUTH EVERY DAY   Systane Balance 0.6 % Soln Generic drug: Propylene Glycol 1 drop as needed.   ZENPEP PO Take by mouth.        Past Medical History:  Diagnosis Date   Allergic rhinitis    Anxiety    Asthma    Diabetes ([place])    Dysphagia    Family history of adverse reaction to anesthesia    sister had PONV   GERD (gastroesophageal reflux disease)    Hiatal hernia    Hypercholesterolemia    Hypertension    Lung cancer ([place]) 2020   Pancreatic cancer ([place]) 2021   Paresthesia     Past Surgical History:  Procedure Laterality Date   COLONOSCOPY  02/10/2016   Mild pancolonic diverticulosis. Otherwise, normal colonoscopy   ESOPHAGOGASTRODUODENOSCOPY  08/24/2011   Prebyesophagus. Mininal hiatal hernia. Mild gastritis   ESOPHAGOGASTRODUODENOSCOPY (EGD) WITH PROPOFOL N/A 11/14/2019   Procedure: ESOPHAGOGASTRODUODENOSCOPY (EGD) WITH PROPOFOL;  Surgeon: [name], MD;  [facility];  Service: Endoscopy;  Laterality: N/A;   EUS N/A 11/14/2019   Procedure: UPPER ENDOSCOPIC ULTRASOUND (EUS) RADIAL;  Surgeon: [name], MD;  [facility];  Service: Endoscopy;  Laterality: N/A;   FINE NEEDLE ASPIRATION N/A 11/14/2019   Procedure: FINE NEEDLE ASPIRATION (FNA) LINEAR;  Surgeon: [name], MD;  [facility];  Service: Endoscopy;  Laterality: N/A;   INTERCOSTAL NERVE BLOCK Right 05/09/2019   Procedure: Intercostal Nerve Block thoracic;  Surgeon: Melrose Nakayama, MD;  Location: [facility];  Service: Thoracic;  Laterality: Right;   LASIK Bilateral    LIPOMA RESECTION      LOBECTOMY Right 05/09/2019   Procedure: LOBECTOMY;  Surgeon: Melrose Nakayama, MD;  Location: [facility];  Service: Thoracic;  Laterality: Right;   UPPER GASTROINTESTINAL ENDOSCOPY  2008   UPPER GASTROINTESTINAL ENDOSCOPY  07/21/2020   VIDEO ASSISTED THORACOSCOPY (VATS)/WEDGE RESECTION Right 05/09/2019   Procedure: VIDEO ASSISTED THORACOSCOPY (VATS)/WEDGE RESECTION FOR FROZEN SECTIONS ;  Surgeon: Melrose Nakayama, MD;  Location: [facility];  Service: Thoracic;  Laterality: Right;   WHIPPLE PROCEDURE  04/30/2020    Review of systems negative except as noted in HPI / PMHx or noted below:  Review of Systems  Constitutional: Negative.   HENT: Negative.    Eyes: Negative.   Respiratory: Negative.    Cardiovascular: Negative.   Gastrointestinal: Negative.   Genitourinary: Negative.  Musculoskeletal: Negative.   Skin: Negative.   Neurological: Negative.   Endo/Heme/Allergies: Negative.   Psychiatric/Behavioral: Negative.      Objective:   Vitals:   11/25/20 1342  BP: 104/62  Pulse: 64  Resp: 12  Temp: (!) 97.3 F (36.3 C)  SpO2: 98%          Physical Exam Constitutional:      Appearance: She is not diaphoretic.  HENT:     Head: Normocephalic.     Right Ear: Tympanic membrane, ear canal and external ear normal.     Left Ear: Tympanic membrane, ear canal and external ear normal.     Nose: Nose normal. No mucosal edema or rhinorrhea.     Mouth/Throat:     Pharynx: Uvula midline. No oropharyngeal exudate.  Eyes:     Conjunctiva/sclera: Conjunctivae normal.  Neck:     Thyroid: No thyromegaly.     Trachea: Trachea normal. No tracheal tenderness or tracheal deviation.  Cardiovascular:     Rate and Rhythm: Normal rate and regular rhythm.     Heart sounds: Normal heart sounds, S1 normal and S2 normal. No murmur heard. Pulmonary:     Effort: No respiratory distress.     Breath sounds: Normal breath sounds. No stridor. No wheezing or rales.  Lymphadenopathy:     Head:      Right side of head: No tonsillar adenopathy.     Left side of head: No tonsillar adenopathy.     Cervical: No cervical adenopathy.  Skin:    Findings: No erythema or rash.     Nails: There is no clubbing.  Neurological:     Mental Status: She is alert.    Diagnostics:    Spirometry was performed and demonstrated an FEV1 of 1.26 at 63 % of predicted.  Assessment and Plan:   1. Asthma, well controlled, mild persistent   2. Other allergic rhinitis   3. LPRD (laryngopharyngeal reflux disease)   4. Esophageal dysmotility     1.  Continue pantoprazole 40 mg in AM + famotidine 40 mg in evening   2.  Continue montelukast 10 mg daily   3.  Continue Flonase + azelastine 1 spray each nostril 1-2 times per day during periods of upper airway symptoms.   4. Continue ProAir HFA 2 puffs every 4-6 hours if needed  5. Continue OTC magnesium 1 time per day  6. Continue budesonide 0.5 mg slurry (1 ampule in teaspoon honey) daily  7. Return to clinic in 12 weeks or earlier if problem  Denesha appears to be doing very well from a airway standpoint and she has found that the use of the budesonide slurry has helped her swallowing discomfort.  But she does not like to use the Stevia and I recommended that she use her budesonide slurry with a teaspoon of honey.  She is having a significant issue with depression that has developed since since she has been shocked by the fact that her son-in-law has filed for 100% of her late husband's assets.  She is working through this issue legally and will get this rectified soon.  I did mention to her that if she still appears to have some problems with depression over the course of the next several weeks she needs to contact her primary care doctor for a pharmaceutical agent to address this issue and consider counseling.   [name], MD Allergy / Immunology [facility]

## 2020-11-26 ENCOUNTER — Encounter: Payer: Self-pay | Admitting: Allergy and Immunology

## 2020-12-25 ENCOUNTER — Telehealth: Payer: Self-pay

## 2020-12-25 NOTE — Telephone Encounter (Addendum)
I spoke with Holly Harper, gave her an appt for Monday @ 11am. ----- Message from Melodye Ped, NP sent at 12/25/2020 10:33 AM EDT ----- Regarding: RE: Rt breast draining, right lung uncomfortable Contact: [phone] I can see her this afternoon if it's not too late. I have to see hospital patients. Or I can see her Monday. ----- Message ----- From: Dairl Ponder, RN Sent: 12/25/2020  10:18 AM EDT To: Melodye Ped, NP Subject: Rt breast draining, right lung uncomfortable   Holly Harper called to see if we would see her sooner than 01/15/21? She has seen her PCP (who told her she needed to see cancer doctor) and surgeon (who told her the pain @ lung area was most likely nerve damage from lung sx in 2020). Holly Harper states her right breast has started draining some clear, crusty stuff close to the nipple area. Area is about the size of pencil lead. It is not red, nor warm to touch. She had mammogram & ultrasound in 09/30/2020, which was fine per Holly Harper. Holly Harper is just nervous, & would like someone to take a quick look at it. Please advise.

## 2020-12-25 NOTE — Telephone Encounter (Signed)
error 

## 2020-12-28 ENCOUNTER — Other Ambulatory Visit: Payer: Self-pay

## 2020-12-28 ENCOUNTER — Encounter: Payer: Self-pay | Admitting: Hematology and Oncology

## 2020-12-28 ENCOUNTER — Inpatient Hospital Stay: Payer: Medicare Other | Attending: Hematology and Oncology | Admitting: Hematology and Oncology

## 2020-12-28 VITALS — BP 163/70 | HR 76 | Temp 97.6°F | Resp 16 | Ht 63.0 in | Wt 90.9 lb

## 2020-12-28 DIAGNOSIS — N644 Mastodynia: Secondary | ICD-10-CM

## 2020-12-28 DIAGNOSIS — C259 Malignant neoplasm of pancreas, unspecified: Secondary | ICD-10-CM

## 2020-12-28 NOTE — Progress Notes (Signed)
[facility]  [address] [place],  [place]  [address] [phone]  Clinic Day:  09/15/2020  Referring physician: Garwin Brothers, MD   HISTORY OF PRESENT ILLNESS:  The patient is a 75 y.o. female with stage IA (T1 N0 M0) pancreatic adenocarcinoma.  After 5 cycles of neoadjuvant FOLFIRINOX chemotherapy, she underwent a Whipple procedure in November 2021, whose pathology revealed an 8 mm moderately differentiated pancreatic adenocarcinoma.  2 lymph nodes were removed, for which neither contained cancer.  She comes in today for new onset right breast pain with nipple discharge x 2 months. She states the discharge was clear to white and stopped over the last couple of days, although her right breast remains very tender. She states she also has pain that runs from her right axilla to her right shoulder and back. She denies fever, chills, nausea or vomiting. She denies shortness of breath, chest pain or cough. She denies issue with bowel or bladder.   PHYSICAL EXAM:  Blood pressure (!) 163/70, pulse 76, temperature 97.6 F (36.4 C), temperature source Oral, resp. rate 16, height 5\' 3"  (1.6 m), weight 90 lb 14.4 oz (41.2 kg), SpO2 100 %. Wt Readings from Last 3 Encounters:  12/28/20 90 lb 14.4 oz (41.2 kg)  09/15/20 93 lb 9.6 oz (42.5 kg)  08/14/20 93 lb 12.8 oz (42.5 kg)   Body mass index is 16.1 kg/m. Performance status (ECOG): 1 Physical Exam Constitutional:      Appearance: Normal appearance. She is not ill-appearing.  HENT:     Mouth/Throat:     Mouth: Mucous membranes are moist.     Pharynx: Oropharynx is clear. No oropharyngeal exudate or posterior oropharyngeal erythema.  Cardiovascular:     Rate and Rhythm: Normal rate and regular rhythm.     Heart sounds: No murmur heard.   No friction rub. No gallop.  Pulmonary:     Effort: Pulmonary effort is normal. No respiratory distress.     Breath sounds: Normal breath sounds. No wheezing, rhonchi or  rales.  Chest:  Breasts:    Right: Tenderness present. No axillary adenopathy or supraclavicular adenopathy.     Left: No axillary adenopathy or supraclavicular adenopathy.  Abdominal:     General: Bowel sounds are normal. There is no distension.     Palpations: Abdomen is soft. There is no mass.     Tenderness: There is no abdominal tenderness.  Musculoskeletal:        General: No swelling.     Right lower leg: No edema.     Left lower leg: No edema.  Lymphadenopathy:     Cervical: No cervical adenopathy.     Upper Body:     Right upper body: No supraclavicular or axillary adenopathy.     Left upper body: No supraclavicular or axillary adenopathy.     Lower Body: No right inguinal adenopathy. No left inguinal adenopathy.  Skin:    General: Skin is warm.     Coloration: Skin is not jaundiced.     Findings: No lesion or rash.  Neurological:     General: No focal deficit present.     Mental Status: She is alert and oriented to person, place, and time. Mental status is at baseline.     Cranial Nerves: Cranial nerves are intact.  Psychiatric:        Mood and Affect: Mood normal.        Behavior: Behavior normal.  Thought Content: Thought content normal.   ASSESSMENT & PLAN:  Assessment/Plan:  A 75 y.o. female with stage IA (ypT1 N0 M0) pancreatic cancer, status post 5 cycles of neoadjuvant FOLFIRINOX chemotherapy, followed by a pylorus-sparing Whipple procedure in November 2021. She comes in with new onset right breast tenderness and nipple discharge over the last couple of months.Most recent mammogram in April was benign. Physical exam is benign. We will obtain right breast ultrasound for further evaluation. I will follow up with patient via telephone with ultrasound results. She is due for CT imaging at the end of this month to follow up on her pancreatic cancer and will follow with Dr. Bobby Rumpf. She knows to keep these appointments as scheduled.   She verbalizes understanding of  and agreement to the plans discussed today. She knows to call the office should any new questions or concerns arise.   Melodye Ped, NP

## 2020-12-29 ENCOUNTER — Telehealth: Payer: Self-pay | Admitting: Hematology and Oncology

## 2020-12-29 NOTE — Telephone Encounter (Signed)
Patient called to see if her U/S Breast has been scheduled.  Melissa faxed U/S order to Scheduling.  Scheduling is in the process of calling patients to schedule their Appt's

## 2020-12-30 ENCOUNTER — Ambulatory Visit: Payer: Medicare Other | Admitting: Allergy and Immunology

## 2021-01-12 ENCOUNTER — Telehealth: Payer: Self-pay | Admitting: Hematology and Oncology

## 2021-01-12 NOTE — Telephone Encounter (Signed)
01/12/21 Spoke with patient and scheduled right breast mammo/ultrasound-02/03/21@1040am 

## 2021-01-15 ENCOUNTER — Inpatient Hospital Stay (INDEPENDENT_AMBULATORY_CARE_PROVIDER_SITE_OTHER): Payer: Medicare Other | Admitting: Hematology and Oncology

## 2021-01-15 ENCOUNTER — Other Ambulatory Visit: Payer: Self-pay

## 2021-01-15 ENCOUNTER — Telehealth: Payer: Self-pay | Admitting: Hematology and Oncology

## 2021-01-15 VITALS — BP 150/66 | HR 70 | Temp 98.5°F | Resp 16 | Ht 63.0 in | Wt 95.0 lb

## 2021-01-15 DIAGNOSIS — C259 Malignant neoplasm of pancreas, unspecified: Secondary | ICD-10-CM

## 2021-01-15 NOTE — Telephone Encounter (Signed)
Per 7/29 LOS, patient scheduled for Nov Follow Up.  Will send Labs, CT Order to Scheduling to Schedule.  Patient entered Follow Up in phone

## 2021-01-15 NOTE — Progress Notes (Signed)
[facility]  [address] [place],  [place]  [address] [phone]  Clinic Day:  09/15/2020  Referring physician: Garwin Brothers, MD   HISTORY OF PRESENT ILLNESS:  The patient is a 75 y.o. female with stage IA (T1 N0 M0) pancreatic adenocarcinoma.  After 5 cycles of neoadjuvant FOLFIRINOX chemotherapy, she underwent a Whipple procedure in November 2021, whose pathology revealed an 8 mm moderately differentiated pancreatic adenocarcinoma.  2 lymph nodes were removed, for which neither contained cancer.  She comes in today to review recent CT imaging. She denies fever, chills, nausea or vomiting. She denies shortness of breath, cough or chest pain. She denies issue with bowel or bladder. She was seen in the office recently for right nipple discharge and tenderness; imaging is scheduled. CBC an CMP are unremarkable.  PHYSICAL EXAM:  Blood pressure (!) 150/66, pulse 70, temperature 98.5 F (36.9 C), resp. rate 16, height 5\' 3"  (1.6 m), weight 95 lb (43.1 kg), SpO2 96 %. Wt Readings from Last 3 Encounters:  01/15/21 95 lb (43.1 kg)  12/28/20 90 lb 14.4 oz (41.2 kg)  09/15/20 93 lb 9.6 oz (42.5 kg)   Body mass index is 16.83 kg/m. Performance status (ECOG): 1 Physical Exam Constitutional:      Appearance: Normal appearance. She is not ill-appearing.  HENT:     Mouth/Throat:     Mouth: Mucous membranes are moist.     Pharynx: Oropharynx is clear. No oropharyngeal exudate or posterior oropharyngeal erythema.  Cardiovascular:     Rate and Rhythm: Normal rate and regular rhythm.     Heart sounds: No murmur heard.   No friction rub. No gallop.  Pulmonary:     Effort: Pulmonary effort is normal. No respiratory distress.     Breath sounds: Normal breath sounds. No wheezing, rhonchi or rales.  Chest:  Breasts:    Right: No tenderness, axillary adenopathy or supraclavicular adenopathy.     Left: No axillary adenopathy or supraclavicular adenopathy.   Abdominal:     General: Bowel sounds are normal. There is no distension.     Palpations: Abdomen is soft. There is no mass.     Tenderness: There is no abdominal tenderness.  Musculoskeletal:        General: No swelling.     Right lower leg: No edema.     Left lower leg: No edema.  Lymphadenopathy:     Cervical: No cervical adenopathy.     Upper Body:     Right upper body: No supraclavicular or axillary adenopathy.     Left upper body: No supraclavicular or axillary adenopathy.     Lower Body: No right inguinal adenopathy. No left inguinal adenopathy.  Skin:    General: Skin is warm.     Coloration: Skin is not jaundiced.     Findings: No lesion or rash.  Neurological:     General: No focal deficit present.     Mental Status: She is alert and oriented to person, place, and time. Mental status is at baseline.     Cranial Nerves: Cranial nerves are intact.  Psychiatric:        Mood and Affect: Mood normal.        Behavior: Behavior normal.        Thought Content: Thought content normal.       Exam(s): [id number] CT/CT ABD-PELV W/IV CM CLINICAL DATA:  Pancreatic cancer.  Right upper lobe lung cancer.  EXAM: CT ABDOMEN AND PELVIS WITH CONTRAST  TECHNIQUE: Multidetector CT imaging of the abdomen and pelvis was performed using the standard protocol following bolus administration of intravenous contrast.  CONTRAST:  100 cc Isovue 370  COMPARISON:  09/10/2020  FINDINGS: Lower chest: Coronary artery atherosclerosis noted.  Hepatobiliary: No suspicious focal abnormality within the liver parenchyma. Gallbladder surgically absent. Gas locules identified in the left liver are presumably pneumobilia from biliary enteric anastomosis.  Pancreas: Status post Whipple procedure. Residual pancreas is markedly atrophic throughout with similar appearance of main duct distension diffusely.  Spleen: No splenomegaly. No focal mass lesion.  Adrenals/Urinary Tract: No adrenal nodule  or mass. Stable cyst upper pole right kidney. Left kidney unremarkable. Mild fullness noted in the calices of both kidneys. No hydroureter. Bladder is distended.  Stomach/Bowel: Status post distal gastrectomy with gastrojejunostomy. Roux limb is nondilated. No small bowel dilatation on today's study. The retained stent is again identified in the pancreatico biliary limb (best seen coronal 19/series 601).. Terminal ileum not well seen. The appendix is normal. There is a marked volume of stool throughout the entire length of the colon which shows diffuse distension with stool and gas.  Vascular/Lymphatic: There is moderate atherosclerotic calcification of the abdominal aorta without aneurysm. There is no gastrohepatic or hepatoduodenal ligament lymphadenopathy. No retroperitoneal or mesenteric lymphadenopathy.. Portal vein, superior mesenteric vein, and splenic vein are patent. No pelvic sidewall lymphadenopathy.  Reproductive: Unremarkable.  Other: No intraperitoneal free fluid.  Musculoskeletal: No worrisome lytic or sclerotic osseous abnormality.  IMPRESSION: 1. Status post Whipple procedure. No evidence for metastatic disease in the abdomen or pelvis. No findings to suggest local recurrence. 2. The retained stent is again identified in the pancreatico biliary limb. 3. Marked volume of stool throughout the entire length of the colon which shows diffuse distension with stool and gas. Imaging features suggest clinical constipation. 4. Mild fullness in the calices of both kidneys without hydroureter. 5. Aortic Atherosclerosis (ICD10-I70.0).   Electronically Signed   By: Misty Stanley M.D.   On: 01/15/2021 12:11  Electronically Signed By: Eugenie Norrie MD  Electronically Signed Date/Time: 07/29/221214 Dictate Date/Time: 01/15/21 1203  Technologist: Dorothy Puffer A Transcribed By: Odie Sera Transcribed Date/Time: 01/15/21 1211   ASSESSMENT & PLAN:  Assessment/Plan:  A 75  y.o. female with stage IA (ypT1 N0 M0) pancreatic cancer, status post 5 cycles of neoadjuvant FOLFIRINOX chemotherapy, followed by a pylorus-sparing Whipple procedure in November 2021. CT imaging today reveals no evidence for metastatic disease in the abdomen or pelvis. No findings to suggest local recurrence. We reviewed these findings in detail and she is pleased with the results. We will follow up with the breast imaging by telephone and see her back in clinic in 4 months with repeat evaluation.    She verbalizes understanding of and agreement to the plans discussed today. She knows to call the office should any new questions or concerns arise.   Melodye Ped, NP

## 2021-01-17 ENCOUNTER — Other Ambulatory Visit: Payer: Self-pay | Admitting: Allergy and Immunology

## 2021-01-17 ENCOUNTER — Other Ambulatory Visit: Payer: Self-pay | Admitting: Gastroenterology

## 2021-01-17 ENCOUNTER — Other Ambulatory Visit: Payer: Self-pay | Admitting: Cardiology

## 2021-01-18 ENCOUNTER — Encounter: Payer: Self-pay | Admitting: Oncology

## 2021-01-18 NOTE — Telephone Encounter (Signed)
Rx sent 

## 2021-01-20 ENCOUNTER — Encounter: Payer: Self-pay | Admitting: Hematology and Oncology

## 2021-02-25 ENCOUNTER — Other Ambulatory Visit: Payer: Self-pay

## 2021-02-25 ENCOUNTER — Ambulatory Visit (INDEPENDENT_AMBULATORY_CARE_PROVIDER_SITE_OTHER): Payer: Medicare Other | Admitting: Allergy and Immunology

## 2021-02-25 ENCOUNTER — Encounter: Payer: Self-pay | Admitting: Allergy and Immunology

## 2021-02-25 VITALS — BP 126/80 | HR 94 | Resp 12

## 2021-02-25 DIAGNOSIS — K224 Dyskinesia of esophagus: Secondary | ICD-10-CM

## 2021-02-25 DIAGNOSIS — J4530 Mild persistent asthma, uncomplicated: Secondary | ICD-10-CM | POA: Diagnosis not present

## 2021-02-25 DIAGNOSIS — L247 Irritant contact dermatitis due to plants, except food: Secondary | ICD-10-CM

## 2021-02-25 DIAGNOSIS — K219 Gastro-esophageal reflux disease without esophagitis: Secondary | ICD-10-CM | POA: Diagnosis not present

## 2021-02-25 DIAGNOSIS — J3089 Other allergic rhinitis: Secondary | ICD-10-CM

## 2021-02-25 MED ORDER — AZELASTINE HCL 0.1 % NA SOLN: NASAL | 5 refills | Status: DC

## 2021-02-25 MED ORDER — MOMETASONE FUROATE 0.1 % EX OINT: TOPICAL_OINTMENT | Freq: Every day | CUTANEOUS | 0 refills | Status: DC

## 2021-02-25 MED ORDER — FLUTICASONE PROPIONATE 50 MCG/ACT NA SUSP: NASAL | 5 refills | Status: DC

## 2021-02-25 NOTE — Patient Instructions (Addendum)
  1.  Continue pantoprazole 40 mg in AM + famotidine 40 mg in evening   2.  Continue montelukast 10 mg daily   3.  Continue Flonase + azelastine 1 spray each nostril 1-2 times per day during periods of upper airway symptoms.   4. Continue ProAir HFA 2 puffs every 4-6 hours if needed  5. Continue budesonide 0.5 mg slurry (1 ampule in honey or splenda) 3-7 times per week  6. Can use mometasone 0.1% cream 1-2 times a a day until resolved  7. Return to clinic in 6 months or earlier if problem  8. Obtain fall flu vaccine

## 2021-02-25 NOTE — Progress Notes (Signed)
Holly Harper   Follow-up Note  Referring Provider: Garwin Brothers, MD Primary Provider: Garwin Brothers, MD Date of Office Visit: 02/25/2021  Subjective:   Holly Harper (DOB: 1946/04/07) is a 75 y.o. female who returns to the [facility] on 02/25/2021 in re-evaluation of the following:  HPI: Holly Harper returns to clinic in evaluation of asthma and allergic rhinitis and LPR and esophageal dysmotility.  Her last visit to this clinic was 25 November 2020.  One of the biggest issues for her was the fact that she had esophageal dysmotility and she was not eating very well and was losing some weight.  We placed her on swallowed budesonide slurry and she really did much better and she continued to do wonderful and now she does not have any swallowing abnormality at all.  She is also on insulin at this point in time and she has gained about 5 pounds of weight.  She stopped using her budesonide slurry over the course of the past month because she was doing so well.  She has had no significant airway issues and does not use any short acting bronchodilator.  She has no issues with her nose.  She has not required an antibiotic or systemic steroid for any type of airway issue.  She has not had any reflux.  She apparently did develop a rather significant episode of plant induced contact dermatitis after being exposed to poison [place] and required a systemic steroid.  Most of her reactivity appears to have resolved but she still has some areas involving her antecubital fossa and forearm that are still red and itchy.  Allergies as of 02/25/2021       Reactions   Amoxicillin Hives   Did it involve swelling of the face/tongue/throat, SOB, or low BP? No Did it involve sudden or severe rash/hives, skin peeling, or any reaction on the inside of your mouth or nose? Yes Did you need to seek medical attention at a hospital or doctor's office? Yes When did it  last happen? ~2015 If all above answers are "NO", may proceed with cephalosporin use.   Cinobac [cinoxacin] Other (See Comments)   Unknown    Macrodantin [nitrofurantoin Macrocrystal] Hives   Semaglutide Nausea Only   rybelsus         Medication List    acetaminophen 325 MG tablet Commonly known as: TYLENOL Take 2 tablets (650 mg total) by mouth every 6 (six) hours as needed for mild pain.   albuterol 108 (90 Base) MCG/ACT inhaler Commonly known as: VENTOLIN HFA Inhale 2 puffs into the lungs every 6 (six) hours as needed for wheezing or shortness of breath.   aspirin EC 81 MG tablet Take 1 tablet (81 mg total) by mouth daily.   atenolol 50 MG tablet Commonly known as: TENORMIN TAKE 1 TABLET BY MOUTH EVERY DAY   atorvastatin 10 MG tablet Commonly known as: LIPITOR TAKE 1 TABLET BY MOUTH EVERYDAY AT BEDTIME   azelastine 0.1 % nasal spray Commonly known as: ASTELIN 1 spray in each nostril 1-2 times per day   famotidine 40 MG tablet Commonly known as: PEPCID Take 1 tablet (40 mg total) by mouth daily.   Farxiga 5 MG Tabs tablet Generic drug: dapagliflozin propanediol Take 5 mg by mouth daily.   fluticasone 27.5 MCG/SPRAY nasal spray Commonly known as: VERAMYST Place 1 spray into the nose daily as needed for rhinitis.   fluticasone 50 MCG/ACT nasal spray  Commonly known as: FLONASE 1 spray in each nostril 1-2 times per day Started by: Holly Harper Holly Rosebush, MD   gabapentin 300 MG capsule Commonly known as: NEURONTIN Take 300 mg by mouth at bedtime.   glipiZIDE 5 MG 24 hr tablet Commonly known as: GLUCOTROL XL Take 5 mg by mouth daily with breakfast.   metFORMIN 500 MG 24 hr tablet Commonly known as: GLUCOPHAGE-XR Take 500 mg by mouth 3 (three) times daily.   mometasone 0.1 % ointment Commonly known as: ELOCON Apply topically daily. Apply 1-2 times per day Started by: Holly Harper Holly Rosebush, MD   montelukast 10 MG tablet Commonly known as: SINGULAIR TAKE 1 TABLET BY  MOUTH EVERY DAY   pantoprazole 40 MG tablet Commonly known as: PROTONIX TAKE 1 TABLET BY MOUTH EVERY DAY   Systane Balance 0.6 % Soln Generic drug: Propylene Glycol 1 drop as needed.   Holly Harper 100 UNIT/ML Harper Pen Generic drug: insulin degludec Inject into the skin.   ZENPEP PO Take by mouth.    Past Medical History:  Diagnosis Date   Allergic rhinitis    Anxiety    Asthma    Diabetes ([facility])    Dysphagia    Family history of adverse reaction to anesthesia    sister had PONV   GERD (gastroesophageal reflux disease)    Hiatal hernia    Hypercholesterolemia    Hypertension    Lung cancer ([facility]) 2020   Pancreatic cancer ([facility]) 2021   Paresthesia     Past Surgical History:  Procedure Laterality Date   COLONOSCOPY  02/10/2016   Mild pancolonic diverticulosis. Otherwise, normal colonoscopy   ESOPHAGOGASTRODUODENOSCOPY  08/24/2011   Prebyesophagus. Mininal hiatal hernia. Mild gastritis   ESOPHAGOGASTRODUODENOSCOPY (EGD) WITH PROPOFOL N/A 11/14/2019   Procedure: ESOPHAGOGASTRODUODENOSCOPY (EGD) WITH PROPOFOL;  Surgeon: [name], MD;  Location: [facility];  Service: Endoscopy;  Laterality: N/A;   EUS N/A 11/14/2019   Procedure: UPPER ENDOSCOPIC ULTRASOUND (EUS) RADIAL;  Surgeon: [name], MD;  Location: [facility];  Service: Endoscopy;  Laterality: N/A;   FINE NEEDLE ASPIRATION N/A 11/14/2019   Procedure: FINE NEEDLE ASPIRATION (FNA) LINEAR;  Surgeon: [name], MD;  Location: [facility];  Service: Endoscopy;  Laterality: N/A;   INTERCOSTAL NERVE BLOCK Right 05/09/2019   Procedure: Intercostal Nerve Block thoracic;  Surgeon: Melrose Nakayama, MD;  Location: [facility];  Service: Thoracic;  Laterality: Right;   LASIK Bilateral    LIPOMA RESECTION     LOBECTOMY Right 05/09/2019   Procedure: LOBECTOMY;  Surgeon: Melrose Nakayama, MD;  Location: [facility];  Service: Thoracic;  Laterality: Right;   UPPER GASTROINTESTINAL ENDOSCOPY  2008    UPPER GASTROINTESTINAL ENDOSCOPY  07/21/2020   VIDEO ASSISTED THORACOSCOPY (VATS)/WEDGE RESECTION Right 05/09/2019   Procedure: VIDEO ASSISTED THORACOSCOPY (VATS)/WEDGE RESECTION FOR FROZEN SECTIONS ;  Surgeon: Melrose Nakayama, MD;  Location: [facility];  Service: Thoracic;  Laterality: Right;   WHIPPLE PROCEDURE  04/30/2020    Review of systems negative except as noted in HPI / PMHx or noted below:  Review of Systems  Constitutional: Negative.   HENT: Negative.    Eyes: Negative.   Respiratory: Negative.    Cardiovascular: Negative.   Gastrointestinal: Negative.   Genitourinary: Negative.   Musculoskeletal: Negative.   Skin: Negative.   Neurological: Negative.   Endo/Heme/Allergies: Negative.   Psychiatric/Behavioral: Negative.      Objective:   Vitals:   02/25/21 1109  BP: 126/80  Pulse: 94  Resp: 12  SpO2: 98%          Physical Exam Constitutional:      Appearance: She is not diaphoretic.  HENT:     Head: Normocephalic.     Right Ear: Tympanic membrane, ear canal and external ear normal.     Left Ear: Tympanic membrane, ear canal and external ear normal.     Nose: Nose normal. No mucosal edema or rhinorrhea.     Mouth/Throat:     Pharynx: Uvula midline. No oropharyngeal exudate.  Eyes:     Conjunctiva/sclera: Conjunctivae normal.  Neck:     Thyroid: No thyromegaly.     Trachea: Trachea normal. No tracheal tenderness or tracheal deviation.  Cardiovascular:     Rate and Rhythm: Normal rate and regular rhythm.     Heart sounds: Normal heart sounds, S1 normal and S2 normal. No murmur heard. Pulmonary:     Effort: No respiratory distress.     Breath sounds: Normal breath sounds. No stridor. No wheezing or rales.  Lymphadenopathy:     Head:     Right side of head: No tonsillar adenopathy.     Left side of head: No tonsillar adenopathy.     Cervical: No cervical adenopathy.  Skin:    Findings: Rash (Erythematous antecubital fossa bilaterally) present. No  erythema.     Nails: There is no clubbing.  Neurological:     Mental Status: She is alert.    Diagnostics:   Assessment and Plan:   1. Asthma, well controlled, mild persistent   2. Other allergic rhinitis   3. LPRD (laryngopharyngeal reflux disease)   4. Esophageal dysmotility   5. Contact dermatitis and eczema due to plant     1.  Continue pantoprazole 40 mg in AM + famotidine 40 mg in evening   2.  Continue montelukast 10 mg daily   3.  Continue Flonase + azelastine 1 spray each nostril 1-2 times per day during periods of upper airway symptoms.   4. Continue ProAir HFA 2 puffs every 4-6 hours if needed  5. Continue budesonide 0.5 mg slurry (1 ampule in honey or splenda) 3-7 times per week  6. Can use mometasone 0.1% cream 1-2 times a a day until resolved  7. Return to clinic in 6 months or earlier if problem  8. Obtain fall flu vaccine  Echo appears to be doing a lot better and I have encouraged her to continue a budesonide slurry even if she utilizes this agent just a few times per week as it is obviously helped her esophageal dysmotility.  She will continue to treat reflux and inflammation of her airway with the therapy noted above and I have given her some topical anti-inflammatory agents for her skin to finish up treatment for her plant-based contact dermatitis.  Assuming she does well with this plan I will see her back in this clinic in 6 months or earlier if there is a problem.   [name], MD [facility] [place]

## 2021-03-01 ENCOUNTER — Encounter: Payer: Self-pay | Admitting: Allergy and Immunology

## 2021-04-02 ENCOUNTER — Telehealth: Payer: Self-pay

## 2021-04-02 NOTE — Telephone Encounter (Addendum)
Pt called, appt given.  RE: Pouches on each side of navel Received: Today Melodye Ped, NP  Dairl Ponder, RN I'm not really sure what to say about that, other than I can see her next week.        Previous Messages   @ 1144- I spoke with pt. The pouches are not red, nor warm. She states, "they are kinda like they have a little fluid in them". Afebrile. I asked if she had seen her PCP? She replied, "Yes, he didn't do squat. I'm hoping y'all can tell me who I need to see".    @ 1029-  Pt LVM to ask if we could see her or tell her who she should see. She states, "I have pouches that have came up on both sides of my navel. I think it maybe related to the whipple surgery I had for her pancreatic cancer. I also had to start taking insulin about 3 months ago".

## 2021-04-05 ENCOUNTER — Encounter: Payer: Self-pay | Admitting: Hematology and Oncology

## 2021-04-05 ENCOUNTER — Inpatient Hospital Stay: Payer: Medicare Other | Attending: Hematology and Oncology | Admitting: Hematology and Oncology

## 2021-04-05 ENCOUNTER — Other Ambulatory Visit: Payer: Self-pay

## 2021-04-05 DIAGNOSIS — R1900 Intra-abdominal and pelvic swelling, mass and lump, unspecified site: Secondary | ICD-10-CM | POA: Insufficient documentation

## 2021-04-05 NOTE — Progress Notes (Signed)
[facility]  [address] [place],  [place]  [address] [phone]  Clinic Day:  04/05/2021  Referring physician: Garwin Brothers, MD  ASSESSMENT & PLAN:   Assessment & Plan: Abdominal swelling She notes slight swelling to her lower abdomen since beginning insulin injections. She does have a slight fullness noted across the lower abdomen. She denies pain or discomfort. Her weight has increased since her last visit and she states her appetite has improved. As she is due for CT imaging related to her pancreatic cancer, we will go ahead and obtain these and review results. She will keep appointment with Dr. Bobby Rumpf if negative findings.     The patient understands the plans discussed today and is in agreement with them.  She knows to contact our office if she develops concerns prior to her next appointment.     Melodye Ped, NP  [facility] [address] [place] [address] Dept: [phone] Dept Fax: [phone]   No orders of the defined types were placed in this encounter.     CHIEF COMPLAINT:  CC: A 75 year old female with history of pancreatic cancer here for new onset lower abdominal swelling  Current Treatment:  Surveillance   HISTORY OF PRESENT ILLNESS:   Oncology History  Pancreatic adenocarcinoma ([place])  12/13/2019 Initial Diagnosis   Pancreatic adenocarcinoma ([employer])   07/13/2020 Cancer Staging   Staging form: Exocrine Pancreas, AJCC 8th Edition - Pathologic stage from 07/13/2020: Stage IA (ypT1, pN0, cM0) - Signed by Marice Potter, MD on 07/13/2020      INTERVAL HISTORY:  Merrilee is here today for symptom management. She notes swelling to her lower abdomen that she describes as "pouches" on either side of the umbilicus. She states she noticed these after  she started insulin, but they do not appear to be that superficial. She denies abdominal pain. She has  been gaining weight since her appetite has improved. She denies fever, chills, nausea or vomiting. She denies shortness of breath, chest pain or cough. She denies issue with bowel or bladder. She is due for CT imaging next month for surveillance of her pancreatic cancer. We will obtain those sooner.  REVIEW OF SYSTEMS:  Review of Systems  Constitutional:  Negative for appetite change, chills, diaphoresis, fatigue, fever and unexpected weight change.  HENT:   Negative for hearing loss, lump/mass, mouth sores, nosebleeds, sore throat, tinnitus, trouble swallowing and voice change.   Eyes:  Negative for eye problems and icterus.  Respiratory:  Negative for chest tightness, cough, hemoptysis, shortness of breath and wheezing.   Cardiovascular:  Negative for chest pain, leg swelling and palpitations.  Gastrointestinal:  Positive for abdominal distention. Negative for abdominal pain, blood in stool, constipation, diarrhea, nausea, rectal pain and vomiting.  Endocrine: Negative for hot flashes.  Genitourinary:  Negative for bladder incontinence, difficulty urinating, dyspareunia, dysuria, frequency, hematuria and nocturia.   Musculoskeletal:  Negative for arthralgias, back pain, flank pain, gait problem, myalgias, neck pain and neck stiffness.  Skin:  Negative for itching, rash and wound.  Neurological:  Negative for dizziness, extremity weakness, gait problem, headaches, light-headedness, numbness, seizures and speech difficulty.  Hematological:  Negative for adenopathy. Does not bruise/bleed easily.  Psychiatric/Behavioral:  Negative for confusion, decreased concentration, depression, sleep disturbance and suicidal ideas. The patient is not nervous/anxious.     VITALS:  Blood pressure (!) 155/65, pulse 65, resp. rate 18, height 5\' 3"  (1.6  m), weight 104 lb (47.2 kg), SpO2 97 %.  Wt Readings from Last 3 Encounters:  04/05/21 104 lb (47.2 kg)  01/15/21 95 lb (43.1 kg)  12/28/20 90 lb 14.4 oz (41.2 kg)     Body mass index is 18.42 kg/m.  Performance status (ECOG): 1 - Symptomatic but completely ambulatory  PHYSICAL EXAM:  Physical Exam Constitutional:      General: She is not in acute distress.    Appearance: Normal appearance. She is normal weight. She is not ill-appearing, toxic-appearing or diaphoretic.  HENT:     Head: Normocephalic and atraumatic.     Right Ear: Tympanic membrane normal.     Left Ear: Tympanic membrane normal.     Nose: Nose normal. No congestion or rhinorrhea.     Mouth/Throat:     Mouth: Mucous membranes are moist.     Pharynx: Oropharynx is clear. No oropharyngeal exudate or posterior oropharyngeal erythema.  Eyes:     General: No scleral icterus.       Right eye: No discharge.        Left eye: No discharge.     Extraocular Movements: Extraocular movements intact.     Conjunctiva/sclera: Conjunctivae normal.     Pupils: Pupils are equal, round, and reactive to light.  Neck:     Vascular: No carotid bruit.  Cardiovascular:     Rate and Rhythm: Normal rate and regular rhythm.     Heart sounds: No murmur heard.   No friction rub. No gallop.  Pulmonary:     Effort: Pulmonary effort is normal. No respiratory distress.     Breath sounds: Normal breath sounds. No stridor. No wheezing, rhonchi or rales.  Chest:     Chest wall: No tenderness.  Abdominal:     General: Abdomen is flat. Bowel sounds are normal. There is no distension.     Palpations: There is no mass.     Tenderness: There is no abdominal tenderness. There is no right CVA tenderness, left CVA tenderness, guarding or rebound.     Hernia: No hernia is present.     Comments: Very minimal swelling noted to the lower abdomen.  Musculoskeletal:        General: No swelling, tenderness, deformity or signs of injury. Normal range of motion.     Cervical back: Normal range of motion and neck supple. No rigidity or tenderness.     Right lower leg: No edema.     Left lower leg: No edema.   Lymphadenopathy:     Cervical: No cervical adenopathy.  Skin:    General: Skin is warm and dry.     Capillary Refill: Capillary refill takes less than 2 seconds.     Coloration: Skin is not jaundiced or pale.     Findings: No bruising, erythema, lesion or rash.  Neurological:     General: No focal deficit present.     Mental Status: She is alert and oriented to person, place, and time. Mental status is at baseline.     Cranial Nerves: No cranial nerve deficit.     Sensory: No sensory deficit.     Motor: No weakness.     Coordination: Coordination normal.     Gait: Gait normal.     Deep Tendon Reflexes: Reflexes normal.  Psychiatric:        Mood and Affect: Mood normal.        Behavior: Behavior normal.        Thought Content: Thought content  normal.        Judgment: Judgment normal.    LABS:   CBC Latest Ref Rng & Units 06/30/2020 05/11/2019 05/10/2019  WBC 4.0 - 10.5 K/uL 7.0 10.2 10.1  Hemoglobin 12.0 - 15.0 g/dL 12.4 8.5(L) 9.3(L)  Hematocrit 36.0 - 46.0 % 38.4 25.7(L) 27.5(L)  Platelets 150.0 - 400.0 K/uL 228.0 150 175   CMP Latest Ref Rng & Units 06/30/2020 05/11/2019 05/10/2019  Glucose 70 - 99 mg/dL 162(H) 207(H) 161(H)  BUN 6 - 23 mg/dL 17 14 13   Creatinine 0.40 - 1.20 mg/dL 0.67 0.91 0.74  Sodium 135 - 145 mEq/L 137 135 134(L)  Potassium 3.5 - 5.1 mEq/L 4.1 4.2 4.5  Chloride 96 - 112 mEq/L 103 102 103  CO2 19 - 32 mEq/L 27 23 21(L)  Calcium 8.4 - 10.5 mg/dL 9.8 8.8(L) 8.5(L)  Total Protein 6.0 - 8.3 g/dL 6.9 - -  Total Bilirubin 0.2 - 1.2 mg/dL 0.5 - -  Alkaline Phos 39 - 117 U/L 72 - -  AST 0 - 37 U/L 32 - -  ALT 0 - 35 U/L 30 - -     No results found for: CEA1 / No results found for: CEA1 No results found for: PSA1 No results found for: XBJ478 No results found for: GNF621  No results found for: TOTALPROTELP, ALBUMINELP, A1GS, A2GS, BETS, BETA2SER, GAMS, MSPIKE, SPEI No results found for: TIBC, FERRITIN, IRONPCTSAT No results found for: LDH  STUDIES:   No results found.    HISTORY:   Past Medical History:  Diagnosis Date   Allergic rhinitis    Anxiety    Asthma    Diabetes ([place])    Dysphagia    Family history of adverse reaction to anesthesia    sister had PONV   GERD (gastroesophageal reflux disease)    Hiatal hernia    Hypercholesterolemia    Hypertension    Lung cancer ([place]) 2020   Pancreatic cancer ([place]) 2021   Paresthesia     Past Surgical History:  Procedure Laterality Date   COLONOSCOPY  02/10/2016   Mild pancolonic diverticulosis. Otherwise, normal colonoscopy   ESOPHAGOGASTRODUODENOSCOPY  08/24/2011   Prebyesophagus. Mininal hiatal hernia. Mild gastritis   ESOPHAGOGASTRODUODENOSCOPY (EGD) WITH PROPOFOL N/A 11/14/2019   Procedure: ESOPHAGOGASTRODUODENOSCOPY (EGD) WITH PROPOFOL;  Surgeon: [name], MD;  Location: [facility];  Service: Endoscopy;  Laterality: N/A;   EUS N/A 11/14/2019   Procedure: UPPER ENDOSCOPIC ULTRASOUND (EUS) RADIAL;  Surgeon: [name], MD;  Location: [facility];  Service: Endoscopy;  Laterality: N/A;   FINE NEEDLE ASPIRATION N/A 11/14/2019   Procedure: FINE NEEDLE ASPIRATION (FNA) LINEAR;  Surgeon: [name], MD;  Location: [facility];  Service: Endoscopy;  Laterality: N/A;   INTERCOSTAL NERVE BLOCK Right 05/09/2019   Procedure: Intercostal Nerve Block thoracic;  Surgeon: Melrose Nakayama, MD;  Location: [facility] OR;  Service: Thoracic;  Laterality: Right;   LASIK Bilateral    LIPOMA RESECTION     LOBECTOMY Right 05/09/2019   Procedure: LOBECTOMY;  Surgeon: Melrose Nakayama, MD;  Location: [facility];  Service: Thoracic;  Laterality: Right;   UPPER GASTROINTESTINAL ENDOSCOPY  2008   UPPER GASTROINTESTINAL ENDOSCOPY  07/21/2020   VIDEO ASSISTED THORACOSCOPY (VATS)/WEDGE RESECTION Right 05/09/2019   Procedure: VIDEO ASSISTED THORACOSCOPY (VATS)/WEDGE RESECTION FOR FROZEN SECTIONS ;  Surgeon: Melrose Nakayama, MD;  Location: [facility] OR;  Service: Thoracic;  Laterality:  Right;   WHIPPLE PROCEDURE  04/30/2020    Family History  Problem Relation  Age of Onset   Prostate cancer Father    Bipolar disorder Sister    Colon polyps Sister    Lung cancer Brother        Smoker   Colon cancer Neg Hx    Esophageal cancer Neg Hx    Rectal cancer Neg Hx    Stomach cancer Neg Hx     Social History:  reports that she has never smoked. She has never used smokeless tobacco. She reports that she does not currently use alcohol. She reports that she does not use drugs.The patient is alone  today.  Allergies:  Allergies  Allergen Reactions   Amoxicillin Hives    Did it involve swelling of the face/tongue/throat, SOB, or low BP? No Did it involve sudden or severe rash/hives, skin peeling, or any reaction on the inside of your mouth or nose? Yes Did you need to seek medical attention at a hospital or doctor's office? Yes When did it last happen? ~2015 If all above answers are "NO", may proceed with cephalosporin use.    Cinobac [Cinoxacin] Other (See Comments)    Unknown    Macrodantin [Nitrofurantoin Macrocrystal] Hives   Semaglutide Nausea Only    rybelsus     Current Medications: Current Outpatient Medications  Medication Sig Dispense Refill   acetaminophen (TYLENOL) 325 MG tablet Take 2 tablets (650 mg total) by mouth every 6 (six) hours as needed for mild pain.     albuterol (VENTOLIN HFA) 108 (90 Base) MCG/ACT inhaler Inhale 2 puffs into the lungs every 6 (six) hours as needed for wheezing or shortness of breath.     aspirin EC 81 MG tablet Take 1 tablet (81 mg total) by mouth daily. 90 tablet 3   atenolol (TENORMIN) 50 MG tablet TAKE 1 TABLET BY MOUTH EVERY DAY 90 tablet 3   atorvastatin (LIPITOR) 10 MG tablet TAKE 1 TABLET BY MOUTH EVERYDAY AT BEDTIME 90 tablet 2   azelastine (ASTELIN) 0.1 % nasal spray 1 spray in each nostril 1-2 times per day 30 mL 5   dapagliflozin propanediol (FARXIGA) 5 MG TABS tablet Take 5 mg by mouth daily.     famotidine  (PEPCID) 40 MG tablet Take 1 tablet (40 mg total) by mouth daily. 90 tablet 2   fluticasone (FLONASE) 50 MCG/ACT nasal spray 1 spray in each nostril 1-2 times per day 16 g 5   fluticasone (VERAMYST) 27.5 MCG/SPRAY nasal spray Place 1 spray into the nose daily as needed for rhinitis.     gabapentin (NEURONTIN) 300 MG capsule Take 300 mg by mouth at bedtime.     glipiZIDE (GLUCOTROL XL) 5 MG 24 hr tablet Take 5 mg by mouth daily with breakfast.     metFORMIN (GLUCOPHAGE-XR) 500 MG 24 hr tablet Take 500 mg by mouth 3 (three) times daily.     mometasone (ELOCON) 0.1 % ointment Apply topically daily. Apply 1-2 times per day 45 g 0   montelukast (SINGULAIR) 10 MG tablet TAKE 1 TABLET BY MOUTH EVERY DAY 90 tablet 1   Pancrelipase, Lip-Prot-Amyl, (ZENPEP PO) Take by mouth.     pantoprazole (PROTONIX) 40 MG tablet TAKE 1 TABLET BY MOUTH EVERY DAY 90 tablet 4   Propylene Glycol (SYSTANE BALANCE) 0.6 % SOLN 1 drop as needed.     TRESIBA FLEXTOUCH 100 UNIT/ML FlexTouch Pen Inject into the skin.     No current facility-administered medications for this visit.

## 2021-04-05 NOTE — Assessment & Plan Note (Signed)
She notes slight swelling to her lower abdomen since beginning insulin injections. She does have a slight fullness noted across the lower abdomen. She denies pain or discomfort. Her weight has increased since her last visit and she states her appetite has improved. As she is due for CT imaging related to her pancreatic cancer, we will go ahead and obtain these and review results. She will keep appointment with Dr. Bobby Rumpf if negative findings.

## 2021-04-06 ENCOUNTER — Other Ambulatory Visit: Payer: Self-pay | Admitting: Hematology and Oncology

## 2021-04-06 DIAGNOSIS — C259 Malignant neoplasm of pancreas, unspecified: Secondary | ICD-10-CM

## 2021-04-18 ENCOUNTER — Other Ambulatory Visit: Payer: Self-pay | Admitting: Allergy and Immunology

## 2021-05-10 NOTE — Progress Notes (Signed)
[facility]  [address] [place],  [place]  [address] [phone]  Clinic Day:  05/19/2021  Referring physician: Garwin Brothers, MD  This document serves as a record of services personally performed by Dequincy Macarthur Critchley, MD. It was created on their behalf by [facility] E, a trained medical scribe. The creation of this record is based on the scribe's personal observations and the provider's statements to them.  HISTORY OF PRESENT ILLNESS:  The patient is a 75 y.o. female with stage IA (T1 N0 M0) pancreatic adenocarcinoma.  He underwent 5 cycles of neoadjuvant FOLFIRINOX chemotherapy before proceeding a Whipple procedure in November 2021.  She also has a history of stage IA3 (T1c N0 M0) lung adenocarcinoma, status post a right upper lobectomy in November 2020.she comes in today to go over all of her CT scans to ascertain her new disease baseline.  Since her last visit, the patient has been doing much better.  She denies having abdominal pain, early satiety, or other GI symptoms which concern her for disease recurrence.  As it pertains to lung cancer, she denies having shortness of breath, hemoptysis, or other respiratory issues which concern her for early disease recurrence.  PHYSICAL EXAM:  Blood pressure (!) 186/81, pulse 68, temperature 98 F (36.7 C), resp. rate 14, height 5\' 3"  (1.6 m), weight 105 lb 6.4 oz (47.8 kg), SpO2 98 %. Wt Readings from Last 3 Encounters:  05/19/21 105 lb 6.4 oz (47.8 kg)  04/05/21 104 lb (47.2 kg)  01/15/21 95 lb (43.1 kg)   Body mass index is 18.67 kg/m. Performance status (ECOG): 1 Physical Exam Constitutional:      Appearance: Normal appearance. She is not ill-appearing.  HENT:     Mouth/Throat:     Mouth: Mucous membranes are moist.     Pharynx: Oropharynx is clear. No oropharyngeal exudate or posterior oropharyngeal erythema.  Cardiovascular:     Rate and Rhythm: Normal rate and regular rhythm.     Heart sounds:  No murmur heard.   No friction rub. No gallop.  Pulmonary:     Effort: Pulmonary effort is normal. No respiratory distress.     Breath sounds: Normal breath sounds. No wheezing, rhonchi or rales.  Chest:  Breasts:    Right: Tenderness present.  Abdominal:     General: Bowel sounds are normal. There is no distension.     Palpations: Abdomen is soft. There is no mass.     Tenderness: There is no abdominal tenderness.  Musculoskeletal:        General: No swelling.     Right lower leg: No edema.     Left lower leg: No edema.  Lymphadenopathy:     Cervical: No cervical adenopathy.     Upper Body:     Right upper body: No supraclavicular or axillary adenopathy.     Left upper body: No supraclavicular or axillary adenopathy.     Lower Body: No right inguinal adenopathy. No left inguinal adenopathy.  Skin:    General: Skin is warm.     Coloration: Skin is not jaundiced.     Findings: No lesion or rash.  Neurological:     General: No focal deficit present.     Mental Status: She is alert and oriented to person, place, and time. Mental status is at baseline.  Psychiatric:        Mood and Affect: Mood normal.        Behavior: Behavior normal.  Thought Content: Thought content normal.   SCANS: Recent CT abdomen and pelvis results have revealed the following: FINDINGS: Lower chest: No acute abnormality identified within the imaged portions of the lung bases. Hepatobiliary: No focal liver abnormality is seen. Status post cholecystectomy.. Pancreas: Postoperative changes from Whipple procedure. Residual pancreas is atrophic with similar appearance of main duct dilatation. Spleen: Normal in size without focal abnormality. Adrenals/Urinary Tract: Upper pole right kidney lesion measures 1.1 cm and 64 Hounsfield units, image 25/2. Unchanged scratch set previously this measured 1.3 cm and 65 Hounsfield units. The urinary bladder is unremarkable. Stomach/Bowel: Distal gastrectomy  with gastrojejunostomy is again noted. Roux limb appears nondilated. Retained stent is again noted in the pancreaticobiliary limb, image 26/2. No bowel wall thickening, inflammation, or distension. Decreased stool burden compared with the previous exam. Vascular/Lymphatic: Aortic atherosclerosis. No aneurysm. No abdominopelvic adenopathy. Reproductive: Unremarkable. Other: No ascites or focal fluid collections. No peritoneal nodule or mass. Musculoskeletal: No acute or significant osseous findings. Chronic right-sided L5 pars defect noted.  IMPRESSION: 1. No acute findings identified within the abdomen or pelvis. 2. Status post Whipple procedure. No specific findings identified to suggest residual or recurrence of tumor or metastatic disease 3. Retained stent is again noted in the pancreatico biliary limb. 4. Slight decrease in size of complex lesion arising from upper pole of right kidney which does not meet CT criteria for a simple cyst measuring 65 Hounsfield units. Attention on future surveillance imaging is recommended. 5. Decreased stool burden compared with previous exam. 6. Aortic Atherosclerosis (ICD10-I70.0).  LABS:   ASSESSMENT & PLAN:  Assessment/Plan:  A 75 y.o. female with stage IA (ypT1 N0 M0) pancreatic cancer, status post 5 cycles of neoadjuvant FOLFIRINOX chemotherapy, followed by a pylorus-sparing Whipple procedure in November 2021. In clinic today, I went over all of her CT scan images with her, for which she could see she remains disease-free.  Clinically, the patient continues to do very well.  I will see her back in 4 months for repeat clinical assessment.  Repeat CT scans of her chest/abdomen/pelvis will be done a day before her next visit for radiographic surveillance of both her lung and pancreatic cancer.  On another note, the patient is mildly anemic, with her MCV having dropped over these past months.  Based upon this, her iron parameters will be checked.  If  low, she will be placed on oral or IV iron.  The patient understands all the plans discussed today and is in agreement with the patient  I, Rita Ohara, am acting as scribe for Marice Potter, MD    I have reviewed this report as typed by the medical scribe, and it is complete and accurate.  Dequincy Macarthur Critchley, MD

## 2021-05-18 ENCOUNTER — Encounter: Payer: Self-pay | Admitting: Oncology

## 2021-05-18 ENCOUNTER — Ambulatory Visit: Payer: Medicare Other | Admitting: Oncology

## 2021-05-18 LAB — HEPATIC FUNCTION PANEL
ALT: 46 — AB (ref 7–35)
AST: 51 — AB (ref 13–35)
Alkaline Phosphatase: 119 (ref 25–125)
Bilirubin, Total: 0.6

## 2021-05-18 LAB — BASIC METABOLIC PANEL
BUN: 10 (ref 4–21)
CO2: 26 — AB (ref 13–22)
Chloride: 109 — AB (ref 99–108)
Creatinine: 0.5 (ref 0.5–1.1)
Glucose: 69
Potassium: 4.2 (ref 3.4–5.3)
Sodium: 139 (ref 137–147)

## 2021-05-18 LAB — CBC AND DIFFERENTIAL
HCT: 35 — AB (ref 36–46)
Hemoglobin: 10.9 — AB (ref 12.0–16.0)
Neutrophils Absolute: 2.58
Platelets: 163 (ref 150–399)
WBC: 4.3

## 2021-05-18 LAB — COMPREHENSIVE METABOLIC PANEL
Albumin: 3.9 (ref 3.5–5.0)
Calcium: 8.5 — AB (ref 8.7–10.7)

## 2021-05-18 LAB — CBC: RBC: 4.27 (ref 3.87–5.11)

## 2021-05-19 ENCOUNTER — Inpatient Hospital Stay: Payer: Medicare Other | Attending: Hematology and Oncology | Admitting: Oncology

## 2021-05-19 ENCOUNTER — Telehealth: Payer: Self-pay | Admitting: Oncology

## 2021-05-19 VITALS — BP 186/81 | HR 68 | Temp 98.0°F | Resp 14 | Ht 63.0 in | Wt 105.4 lb

## 2021-05-19 DIAGNOSIS — C259 Malignant neoplasm of pancreas, unspecified: Secondary | ICD-10-CM

## 2021-05-19 NOTE — Telephone Encounter (Signed)
Per 11/30 Staff Msg, patient scheduled for 12/5 Labs at 10:45 am.  Patient Notified

## 2021-05-19 NOTE — Telephone Encounter (Signed)
Per 11/30 los next appt scheduled and confirmed with patient

## 2021-05-20 HISTORY — PX: OTHER SURGICAL HISTORY: SHX169

## 2021-05-24 ENCOUNTER — Other Ambulatory Visit: Payer: Self-pay

## 2021-05-24 ENCOUNTER — Other Ambulatory Visit: Payer: Self-pay | Admitting: Hematology and Oncology

## 2021-05-24 ENCOUNTER — Inpatient Hospital Stay: Payer: Medicare Other | Attending: Hematology and Oncology | Admitting: Hematology and Oncology

## 2021-05-24 DIAGNOSIS — C259 Malignant neoplasm of pancreas, unspecified: Secondary | ICD-10-CM

## 2021-05-24 DIAGNOSIS — D509 Iron deficiency anemia, unspecified: Secondary | ICD-10-CM | POA: Diagnosis not present

## 2021-05-24 LAB — BASIC METABOLIC PANEL
BUN: 15 (ref 4–21)
CO2: 27 — AB (ref 13–22)
Chloride: 105 (ref 99–108)
Creatinine: 0.6 (ref 0.5–1.1)
Glucose: 68
Potassium: 4 (ref 3.4–5.3)
Sodium: 137 (ref 137–147)

## 2021-05-24 LAB — COMPREHENSIVE METABOLIC PANEL
Albumin: 4 (ref 3.5–5.0)
Calcium: 8.4 — AB (ref 8.7–10.7)

## 2021-05-24 LAB — HEPATIC FUNCTION PANEL
ALT: 31 (ref 7–35)
AST: 40 — AB (ref 13–35)
Alkaline Phosphatase: 126 — AB (ref 25–125)
Bilirubin, Total: 0.6

## 2021-05-24 LAB — CBC AND DIFFERENTIAL
HCT: 35 — AB (ref 36–46)
Hemoglobin: 11.1 — AB (ref 12.0–16.0)
Neutrophils Absolute: 2.68
Platelets: 170 (ref 150–399)
WBC: 4.4

## 2021-05-24 LAB — CBC: RBC: 4.36 (ref 3.87–5.11)

## 2021-05-25 LAB — IRON AND TIBC
Iron: 35 ug/dL (ref 28–170)
Saturation Ratios: 6 % — ABNORMAL LOW (ref 10.4–31.8)
TIBC: 607 ug/dL — ABNORMAL HIGH (ref 250–450)
UIBC: 572 ug/dL

## 2021-05-25 LAB — FERRITIN: Ferritin: 7 ng/mL — ABNORMAL LOW (ref 11–307)

## 2021-05-27 ENCOUNTER — Telehealth: Payer: Self-pay

## 2021-05-27 ENCOUNTER — Encounter: Payer: Self-pay | Admitting: Oncology

## 2021-05-27 DIAGNOSIS — D509 Iron deficiency anemia, unspecified: Secondary | ICD-10-CM | POA: Insufficient documentation

## 2021-05-27 NOTE — Telephone Encounter (Signed)
Dr Bobby Rumpf states, "Pt needs IV iron". I called pt and notified her of Dr Bobby Rumpf' recommendation for IV iron. She is agreeable to the infusions. I told her we would check with her insurance and then someone from scheduling would call her with appt. She verbalized understanding.

## 2021-05-28 ENCOUNTER — Telehealth: Payer: Self-pay | Admitting: Oncology

## 2021-05-28 NOTE — Telephone Encounter (Signed)
Per 12/8 Staff Msg, patient scheduled for 5 Venofer Infusions.  Patient Notified

## 2021-05-31 ENCOUNTER — Other Ambulatory Visit: Payer: Self-pay | Admitting: Pharmacist

## 2021-06-01 ENCOUNTER — Inpatient Hospital Stay: Payer: Medicare Other

## 2021-06-01 ENCOUNTER — Other Ambulatory Visit: Payer: Self-pay

## 2021-06-01 VITALS — BP 151/62 | HR 74 | Temp 97.6°F | Resp 16 | Wt 108.0 lb

## 2021-06-01 DIAGNOSIS — D509 Iron deficiency anemia, unspecified: Secondary | ICD-10-CM

## 2021-06-01 MED ORDER — SODIUM CHLORIDE 0.9 % IV SOLN: Freq: Once | INTRAVENOUS | Status: AC

## 2021-06-01 MED ORDER — HEPARIN SOD (PORK) LOCK FLUSH 100 UNIT/ML IV SOLN
500.0000 [IU] | Freq: Once | INTRAVENOUS | Status: AC | PRN
  Administered 2021-06-01: 500 [IU]

## 2021-06-01 MED ORDER — SODIUM CHLORIDE 0.9% FLUSH
10.0000 mL | Freq: Once | INTRAVENOUS | Status: AC | PRN
  Administered 2021-06-01: 10 mL

## 2021-06-01 MED ORDER — SODIUM CHLORIDE 0.9 % IV SOLN
200.0000 mg | Freq: Once | INTRAVENOUS | Status: AC
  Administered 2021-06-01: 200 mg via INTRAVENOUS
  Filled 2021-06-01: qty 10

## 2021-06-01 NOTE — Patient Instructions (Signed)

## 2021-06-03 ENCOUNTER — Inpatient Hospital Stay: Payer: Medicare Other

## 2021-06-03 ENCOUNTER — Other Ambulatory Visit: Payer: Self-pay

## 2021-06-03 VITALS — BP 133/61 | HR 63 | Temp 97.7°F | Resp 18 | Ht 63.0 in | Wt 107.8 lb

## 2021-06-03 DIAGNOSIS — D509 Iron deficiency anemia, unspecified: Secondary | ICD-10-CM | POA: Diagnosis not present

## 2021-06-03 MED ORDER — SODIUM CHLORIDE 0.9 % IV SOLN
200.0000 mg | Freq: Once | INTRAVENOUS | Status: AC
  Administered 2021-06-03: 200 mg via INTRAVENOUS
  Filled 2021-06-03: qty 200

## 2021-06-03 MED ORDER — HEPARIN SOD (PORK) LOCK FLUSH 100 UNIT/ML IV SOLN
500.0000 [IU] | Freq: Once | INTRAVENOUS | Status: AC | PRN
  Administered 2021-06-03: 500 [IU]

## 2021-06-03 MED ORDER — SODIUM CHLORIDE 0.9 % IV SOLN: Freq: Once | INTRAVENOUS | Status: AC

## 2021-06-03 MED ORDER — SODIUM CHLORIDE 0.9% FLUSH
10.0000 mL | Freq: Once | INTRAVENOUS | Status: AC | PRN
  Administered 2021-06-03: 10 mL

## 2021-06-03 NOTE — Progress Notes (Signed)
1503: PT STABLE AT TIME OF DISCHARGE

## 2021-06-03 NOTE — Patient Instructions (Signed)

## 2021-06-08 ENCOUNTER — Other Ambulatory Visit: Payer: Self-pay

## 2021-06-08 ENCOUNTER — Inpatient Hospital Stay: Payer: Medicare Other

## 2021-06-08 VITALS — BP 166/73 | HR 71 | Temp 97.6°F | Resp 18

## 2021-06-08 DIAGNOSIS — D509 Iron deficiency anemia, unspecified: Secondary | ICD-10-CM

## 2021-06-08 MED ORDER — SODIUM CHLORIDE 0.9 % IV SOLN
200.0000 mg | Freq: Once | INTRAVENOUS | Status: AC
  Administered 2021-06-08: 200 mg via INTRAVENOUS
  Filled 2021-06-08: qty 200

## 2021-06-08 MED ORDER — SODIUM CHLORIDE 0.9 % IV SOLN: Freq: Once | INTRAVENOUS | Status: AC

## 2021-06-08 MED ORDER — SODIUM CHLORIDE 0.9% FLUSH
10.0000 mL | Freq: Once | INTRAVENOUS | Status: AC | PRN
  Administered 2021-06-08: 10 mL

## 2021-06-08 MED ORDER — HEPARIN SOD (PORK) LOCK FLUSH 100 UNIT/ML IV SOLN
500.0000 [IU] | Freq: Once | INTRAVENOUS | Status: AC | PRN
  Administered 2021-06-08: 500 [IU]

## 2021-06-08 NOTE — Patient Instructions (Signed)

## 2021-06-10 ENCOUNTER — Other Ambulatory Visit: Payer: Self-pay

## 2021-06-10 ENCOUNTER — Inpatient Hospital Stay: Payer: Medicare Other

## 2021-06-10 VITALS — BP 147/72 | HR 64 | Temp 98.3°F | Resp 18

## 2021-06-10 DIAGNOSIS — D509 Iron deficiency anemia, unspecified: Secondary | ICD-10-CM

## 2021-06-10 MED ORDER — SODIUM CHLORIDE 0.9 % IV SOLN
200.0000 mg | Freq: Once | INTRAVENOUS | Status: AC
  Administered 2021-06-10: 200 mg via INTRAVENOUS
  Filled 2021-06-10: qty 200

## 2021-06-10 MED ORDER — SODIUM CHLORIDE 0.9 % IV SOLN: Freq: Once | INTRAVENOUS | Status: AC

## 2021-06-10 NOTE — Patient Instructions (Signed)

## 2021-06-11 ENCOUNTER — Inpatient Hospital Stay: Payer: Medicare Other

## 2021-06-11 VITALS — BP 160/80 | HR 60 | Temp 97.2°F | Resp 18

## 2021-06-11 DIAGNOSIS — D509 Iron deficiency anemia, unspecified: Secondary | ICD-10-CM | POA: Diagnosis not present

## 2021-06-11 MED ORDER — SODIUM CHLORIDE 0.9 % IV SOLN: Freq: Once | INTRAVENOUS | Status: AC

## 2021-06-11 MED ORDER — SODIUM CHLORIDE 0.9% FLUSH
10.0000 mL | Freq: Once | INTRAVENOUS | Status: AC | PRN
  Administered 2021-06-11: 10 mL

## 2021-06-11 MED ORDER — HEPARIN SOD (PORK) LOCK FLUSH 100 UNIT/ML IV SOLN
500.0000 [IU] | Freq: Once | INTRAVENOUS | Status: AC | PRN
  Administered 2021-06-11: 500 [IU]

## 2021-06-11 MED ORDER — SODIUM CHLORIDE 0.9 % IV SOLN
200.0000 mg | Freq: Once | INTRAVENOUS | Status: AC
  Administered 2021-06-11: 200 mg via INTRAVENOUS
  Filled 2021-06-11: qty 200

## 2021-06-11 NOTE — Patient Instructions (Signed)

## 2021-07-09 ENCOUNTER — Other Ambulatory Visit: Payer: Self-pay | Admitting: Cardiology

## 2021-07-09 NOTE — Telephone Encounter (Signed)
Atenolol 50 mg # 90 only with message for pharmacy to contact patients PCP for future refills. Patient is prn for [facility]

## 2021-08-19 ENCOUNTER — Other Ambulatory Visit: Payer: Self-pay | Admitting: Allergy and Immunology

## 2021-08-23 ENCOUNTER — Encounter: Payer: Self-pay | Admitting: Allergy and Immunology

## 2021-08-23 ENCOUNTER — Ambulatory Visit (INDEPENDENT_AMBULATORY_CARE_PROVIDER_SITE_OTHER): Payer: Medicare Other | Admitting: Allergy and Immunology

## 2021-08-23 ENCOUNTER — Other Ambulatory Visit: Payer: Self-pay

## 2021-08-23 ENCOUNTER — Telehealth: Payer: Self-pay | Admitting: Oncology

## 2021-08-23 VITALS — BP 124/70 | HR 64 | Temp 97.6°F | Resp 12

## 2021-08-23 DIAGNOSIS — J3089 Other allergic rhinitis: Secondary | ICD-10-CM | POA: Diagnosis not present

## 2021-08-23 DIAGNOSIS — K219 Gastro-esophageal reflux disease without esophagitis: Secondary | ICD-10-CM | POA: Diagnosis not present

## 2021-08-23 DIAGNOSIS — K224 Dyskinesia of esophagus: Secondary | ICD-10-CM

## 2021-08-23 DIAGNOSIS — J4530 Mild persistent asthma, uncomplicated: Secondary | ICD-10-CM | POA: Diagnosis not present

## 2021-08-23 DIAGNOSIS — J0140 Acute pansinusitis, unspecified: Secondary | ICD-10-CM

## 2021-08-23 MED ORDER — AZITHROMYCIN 500 MG PO TABS: 500.0000 mg | ORAL_TABLET | Freq: Every day | ORAL | 0 refills | Status: AC

## 2021-08-23 NOTE — Patient Instructions (Addendum)
?  1.  Continue pantoprazole 40 mg in AM + famotidine 40 mg in evening  ? ?2.  Continue montelukast 10 mg daily  ? ?3.  Continue Flonase + azelastine 1 spray each nostril 1-2 times per day during periods of upper airway symptoms.  ? ?4. Continue ProAir HFA 2 puffs every 4-6 hours if needed ? ?5. Continue budesonide 0.5 mg slurry (1 ampule in honey or splenda) 3-7 times per week if needed ? ?6. For this recent episode: ? ? A. Use topical nasal saline a few times per day ? B. Azithromycin 500 mg - 1 tablet 1 time per day for 5 days.  ? ?7. Return to clinic in 6 months or earlier if problem ? ? ? ? ?  ? ?  ?

## 2021-08-23 NOTE — Telephone Encounter (Signed)
CT C/A/P has been scheduled for 09/15/21 @ 10 am ; Check in at 9:00 am.  ? ?Notified pt of date,time and instructions.  ?

## 2021-08-23 NOTE — Progress Notes (Signed)
[place]   Follow-up Note  Referring Provider: Garwin Brothers, MD Primary Provider: Garwin Brothers, MD Date of Office Visit: 08/23/2021  Subjective:   Holly Harper (DOB: 02-24-1946) is a 76 y.o. female who returns to the [facility] on 08/23/2021 in re-evaluation of the following:  HPI: Holly Harper returns to this clinic in evaluation of asthma and allergic rhinitis and LPR and esophageal dysmotility.  Her last visit to this clinic was 25 February 2021.  She has really done well with her airway without any flares of asthma without the need for systemic steroid to treat an exacerbation of asthma without the need for short acting bronchodilator.  Her nose has also been doing quite well although over the course of the past week she has developed a headache and stuffiness and facial discomfort.  She has not had any anosmia or ugly nasal discharge or fever or associated systemic or constitutional symptoms.  She apparently was given a systemic steroid for a shingles vaccine reaction not too long ago and she is not interested in using any more systemic steroids.  Her esophageal dysmotility is going quite well.  She does not need to use any swallowed budesonide at this point.  Her reflux is going quite well.  She must continue to use a combination of a proton pump inhibitor and H2 receptor blocker.  Allergies as of 08/23/2021       Reactions   Amoxicillin Hives   Did it involve swelling of the face/tongue/throat, SOB, or low BP? No Did it involve sudden or severe rash/hives, skin peeling, or any reaction on the inside of your mouth or nose? Yes Did you need to seek medical attention at a hospital or doctor's office? Yes When did it last happen? ~2015 If all above answers are NO, may proceed with cephalosporin use.   Cinobac [cinoxacin] Other (See Comments)   Unknown    Macrodantin [nitrofurantoin Macrocrystal] Hives   Semaglutide  Nausea Only   rybelsus         Medication List    acetaminophen 325 MG tablet Commonly known as: TYLENOL Take 2 tablets (650 mg total) by mouth every 6 (six) hours as needed for mild pain.   albuterol 108 (90 Base) MCG/ACT inhaler Commonly known as: VENTOLIN HFA Inhale 2 puffs into the lungs every 6 (six) hours as needed for wheezing or shortness of breath.   aspirin EC 81 MG tablet Take 1 tablet (81 mg total) by mouth daily.   atenolol 50 MG tablet Commonly known as: TENORMIN Take 1 tablet (50 mg total) by mouth daily.   atorvastatin 10 MG tablet Commonly known as: LIPITOR TAKE 1 TABLET BY MOUTH EVERYDAY AT BEDTIME   azelastine 0.1 % nasal spray Commonly known as: ASTELIN 1 spray in each nostril 1-2 times per day   ergocalciferol 1.25 MG (50000 UT) capsule Commonly known as: VITAMIN D2 Take 50,000 Units by mouth once a week.   famotidine 40 MG tablet Commonly known as: PEPCID Take 1 tablet (40 mg total) by mouth daily.   fluticasone 50 MCG/ACT nasal spray Commonly known as: FLONASE 1 spray in each nostril 1-2 times per day   gabapentin 300 MG capsule Commonly known as: NEURONTIN Take 300 mg by mouth at bedtime.   glipiZIDE 5 MG 24 hr tablet Commonly known as: GLUCOTROL XL Take 5 mg by mouth daily with breakfast.   montelukast 10 MG tablet Commonly known as: SINGULAIR  TAKE 1 TABLET BY MOUTH EVERY DAY   pantoprazole 40 MG tablet Commonly known as: PROTONIX TAKE 1 TABLET BY MOUTH EVERY DAY   Tresiba FlexTouch 100 UNIT/ML FlexTouch Pen Generic drug: insulin degludec Inject into the skin.   Xigduo XR 5-500 MG Tb24 Generic drug: Dapagliflozin-metFORMIN HCl ER Take by mouth.   Zenpep 40000-126000 units Cpep Generic drug: Pancrelipase (Lip-Prot-Amyl) Take by mouth.   Past Medical History:  Diagnosis Date   Allergic rhinitis    Anxiety    Asthma    Diabetes ([place])    Dysphagia    Family history of adverse reaction to anesthesia    sister had PONV    GERD (gastroesophageal reflux disease)    Hiatal hernia    Hypercholesterolemia    Hypertension    Lung cancer ([place]) 2020   Pancreatic cancer ([place]) 2021   Paresthesia     Past Surgical History:  Procedure Laterality Date   COLONOSCOPY  02/10/2016   Mild pancolonic diverticulosis. Otherwise, normal colonoscopy   ESOPHAGOGASTRODUODENOSCOPY  08/24/2011   Prebyesophagus. Mininal hiatal hernia. Mild gastritis   ESOPHAGOGASTRODUODENOSCOPY (EGD) WITH PROPOFOL N/A 11/14/2019   Procedure: ESOPHAGOGASTRODUODENOSCOPY (EGD) WITH PROPOFOL;  Surgeon: [name], MD;  Location: [facility];  Service: Endoscopy;  Laterality: N/A;   EUS N/A 11/14/2019   Procedure: UPPER ENDOSCOPIC ULTRASOUND (EUS) RADIAL;  Surgeon: [name], MD;  Location: [facility];  Service: Endoscopy;  Laterality: N/A;   FINE NEEDLE ASPIRATION N/A 11/14/2019   Procedure: FINE NEEDLE ASPIRATION (FNA) LINEAR;  Surgeon: [name], MD;  Location: [facility];  Service: Endoscopy;  Laterality: N/A;   INTERCOSTAL NERVE BLOCK Right 05/09/2019   Procedure: Intercostal Nerve Block thoracic;  Surgeon: Melrose Nakayama, MD;  Location: [facility];  Service: Thoracic;  Laterality: Right;   iron infusion  05/2021   LASIK Bilateral    LIPOMA RESECTION     LOBECTOMY Right 05/09/2019   Procedure: LOBECTOMY;  Surgeon: Melrose Nakayama, MD;  Location: [facility];  Service: Thoracic;  Laterality: Right;   UPPER GASTROINTESTINAL ENDOSCOPY  2008   UPPER GASTROINTESTINAL ENDOSCOPY  07/21/2020   VIDEO ASSISTED THORACOSCOPY (VATS)/WEDGE RESECTION Right 05/09/2019   Procedure: VIDEO ASSISTED THORACOSCOPY (VATS)/WEDGE RESECTION FOR FROZEN SECTIONS ;  Surgeon: Melrose Nakayama, MD;  Location: [facility];  Service: Thoracic;  Laterality: Right;   WHIPPLE PROCEDURE  04/30/2020    Review of systems negative except as noted in HPI / PMHx or noted below:  Review of Systems  Constitutional: Negative.   HENT: Negative.    Eyes:  Negative.   Respiratory: Negative.    Cardiovascular: Negative.   Gastrointestinal: Negative.   Genitourinary: Negative.   Musculoskeletal: Negative.   Skin: Negative.   Neurological: Negative.   Endo/Heme/Allergies: Negative.   Psychiatric/Behavioral: Negative.      Objective:   Vitals:   08/23/21 1053  BP: 124/70  Pulse: 64  Resp: 12  Temp: 97.6 F (36.4 C)  SpO2: 98%          Physical Exam Constitutional:      Appearance: She is not diaphoretic.  HENT:     Head: Normocephalic.     Right Ear: Tympanic membrane, ear canal and external ear normal.     Left Ear: Tympanic membrane, ear canal and external ear normal.     Nose: Nose normal. No mucosal edema or rhinorrhea.     Mouth/Throat:     Pharynx: Uvula midline. No oropharyngeal exudate.  Eyes:     Conjunctiva/sclera:  Conjunctivae normal.  Neck:     Thyroid: No thyromegaly.     Trachea: Trachea normal. No tracheal tenderness or tracheal deviation.  Cardiovascular:     Rate and Rhythm: Normal rate and regular rhythm.     Heart sounds: Normal heart sounds, S1 normal and S2 normal. No murmur heard. Pulmonary:     Effort: No respiratory distress.     Breath sounds: Normal breath sounds. No stridor. No wheezing or rales.  Lymphadenopathy:     Head:     Right side of head: No tonsillar adenopathy.     Left side of head: No tonsillar adenopathy.     Cervical: No cervical adenopathy.  Skin:    Findings: No erythema or rash.     Nails: There is no clubbing.  Neurological:     Mental Status: She is alert.    Diagnostics: none  Assessment and Plan:   1. Asthma, well controlled, mild persistent   2. Other allergic rhinitis   3. LPRD (laryngopharyngeal reflux disease)   4. Esophageal dysmotility   5. Acute non-recurrent pansinusitis     1.  Continue pantoprazole 40 mg in AM + famotidine 40 mg in evening   2.  Continue montelukast 10 mg daily   3.  Continue Flonase + azelastine 1 spray each nostril 1-2  times per day during periods of upper airway symptoms.   4. Continue ProAir HFA 2 puffs every 4-6 hours if needed  5. Continue budesonide 0.5 mg slurry (1 ampule in honey or splenda) 3-7 times per week if needed  6. For this recent episode:   A. Use topical nasal saline a few times per day  B. Azithromycin 500 mg - 1 tablet 1 time per day for 5 days.   7. Return to clinic in 6 months or earlier if problem  Edyth appears to have a sinus infection and it has been over a week since the onset of this issue and we will now give her a broad-spectrum antibiotic and have her perform some nasal saline washes a few times per day.  Overall she has done relatively well regarding her airway on her current therapy directed against respiratory tract inflammation and reflux.  Fortunately, her esophageal dysmotility is not very active and she no longer needs to use any budesonide slurry.  Assuming she does well with the plan noted above I will see her back in this clinic in 6 months or earlier if there is a problem.   [name], MD Allergy / Immunology [place]

## 2021-08-24 ENCOUNTER — Encounter: Payer: Self-pay | Admitting: Allergy and Immunology

## 2021-09-11 ENCOUNTER — Other Ambulatory Visit: Payer: Self-pay | Admitting: Allergy and Immunology

## 2021-09-15 NOTE — Progress Notes (Signed)
?[facility]  ?[address] ?[place],  [place]  [address] ?[phone] ? ?Clinic Day:  09/16/2021 ? ?Referring physician: Garwin Brothers, MD ? ? ?HISTORY OF PRESENT ILLNESS:  ?The patient is a 76 y.o. female with stage IA (T1 N0 M0) pancreatic adenocarcinoma.  He underwent 5 cycles of neoadjuvant FOLFIRINOX chemotherapy before proceeding a Whipple procedure in November 2021.  She also has a history of stage IA3 (T1c N0 M0) lung adenocarcinoma, status post a right upper lobectomy in November 2020.  She comes in today to go over all of her CT scans to ascertain her new disease baseline.  Since her last visit, the patient has been doing well.  She denies having abdominal pain, early satiety, or other GI symptoms which concern her for disease recurrence.  As it pertains to lung cancer, she denies having shortness of breath, hemoptysis, or other respiratory issues which concern her for early disease recurrence. ? ?PHYSICAL EXAM:  ?Blood pressure (!) 182/77, pulse 67, temperature 98.1 ?F (36.7 ?C), resp. rate 14, height 5\' 3"  (1.6 m), weight 107 lb 11.2 oz (48.9 kg), SpO2 96 %. ?Wt Readings from Last 3 Encounters:  ?09/16/21 107 lb 11.2 oz (48.9 kg)  ?06/03/21 107 lb 12 oz (48.9 kg)  ?06/01/21 108 lb (49 kg)  ? ?Body mass index is 19.08 kg/m?Marland Kitchen ?Performance status (ECOG): 1 ?Physical Exam ?Constitutional:   ?   Appearance: Normal appearance. She is not ill-appearing.  ?HENT:  ?   Mouth/Throat:  ?   Mouth: Mucous membranes are moist.  ?   Pharynx: Oropharynx is clear. No oropharyngeal exudate or posterior oropharyngeal erythema.  ?Cardiovascular:  ?   Rate and Rhythm: Normal rate and regular rhythm.  ?   Heart sounds: No murmur heard. ?  No friction rub. No gallop.  ?Pulmonary:  ?   Effort: Pulmonary effort is normal. No respiratory distress.  ?   Breath sounds: Normal breath sounds. No wheezing, rhonchi or rales.  ?Chest:  ?Breasts: ?   Right: Tenderness present.  ?Abdominal:  ?   General:  Bowel sounds are normal. There is no distension.  ?   Palpations: Abdomen is soft. There is no mass.  ?   Tenderness: There is no abdominal tenderness.  ?Musculoskeletal:     ?   General: No swelling.  ?   Right lower leg: No edema.  ?   Left lower leg: No edema.  ?Lymphadenopathy:  ?   Cervical: No cervical adenopathy.  ?   Upper Body:  ?   Right upper body: No supraclavicular or axillary adenopathy.  ?   Left upper body: No supraclavicular or axillary adenopathy.  ?   Lower Body: No right inguinal adenopathy. No left inguinal adenopathy.  ?Skin: ?   General: Skin is warm.  ?   Coloration: Skin is not jaundiced.  ?   Findings: No lesion or rash.  ?Neurological:  ?   General: No focal deficit present.  ?   Mental Status: She is alert and oriented to person, place, and time. Mental status is at baseline.  ?Psychiatric:     ?   Mood and Affect: Mood normal.     ?   Behavior: Behavior normal.     ?   Thought Content: Thought content normal.  ? ?SCANS: CT scans of her chest/abdomen/pelvis done yesterday revealed the following: ?FINDINGS:  ?CT CHEST FINDINGS  ?Cardiovascular: Mild cardiomegaly. Coronary and branch vessel  ?atherosclerotic calcification. Left Port-A-Cath tip: SVC.  ?  Mediastinum/Nodes: Calcified left thyroid nodules worked up on prior  ?thyroid ultrasound of 10/06/2020. This has been evaluated on  ?previous imaging. (ref: J Am Coll Radiol. 2015 Feb;12(2): 143-50).No  ?pathologic adenopathy observed.  ?Lungs/Pleura: Right upper lobectomy. Volume loss wedge resection  ?clips posteriorly along the right middle lobe, morphology similar to  ?10/07/2019. Mild lingular scarring versus subsegmental atelectasis.  ?Musculoskeletal: Thoracic kyphosis. Hemangioma in the T12 vertebral  ?body superiorly.  ?CT ABDOMEN PELVIS FINDINGS  ?Hepatobiliary: Cholecystectomy. Small amount of pneumobilia. No  ?focal parenchymal lesion to suggest hepatic metastatic disease.  ?Pancreas: Postoperative findings from prior Whipple  procedure.  ?Stable atrophic appearance the pancreatic tail and body. Currently  ?no substantial dilatation of the dorsal pancreatic duct is  ?identified.  ?Spleen: Unremarkable  ?Adrenals/Urinary Tract: 1.2 cm in long axis hypodense lesion of the  ?right kidney upper pole posteriorly is stable by my measurements and  ?probably represents a cyst with adjacent scarring based on  ?morphology, but is technically nonspecific due to small size and  ?volume averaging. 8 mm hypodense left mid kidney lesion is likewise  ?nonspecific although probably benign. Small left peripelvic cysts  ?are present. The adrenal glands in urinary bladder appear  ?unremarkable.  ?Stomach/Bowel: Patent gastrojejunostomy. Distal gastrectomy.  ?Retained stent in the pancreaticobiliary limb, image 57 series 2,  ?with no dilatation of the pancreaticobiliary limb. Orally  ?administered contrast extends through to the rectum. Wall thickening  ?in the descending and sigmoid colon with some potential mild luminal  ?fold irregularity although this may be secondary to nondistention.  ?Vascular/Lymphatic: Mild atherosclerosis with involvement including  ?the aortoiliac tree. There is plaque in the proximal SMA but without  ?a critical degree of stenosis. There is also some plaque in the  ?proximal IMA but the inferior mesenteric artery is opacified and  ?accordingly thought to be patent. No pathologic adenopathy observed.  ?Reproductive: Posterior calcified subserosal fibroid along the  ?uterine fundus.  ?Other: Low-level chronic edema along the mesentery and root of the  ?mesentery.  ?Musculoskeletal: Right unilateral chronic pars defect at L5 without  ?anterolisthesis. Lumbar spondylosis and degenerative disc disease. ? ?IMPRESSION:  ?1. No findings of recurrent lung cancer or recurrent pancreatic  ?cancer. Prior Whipple procedure in prior right upper lobectomy.  ?2. The right kidney upper pole lesion of concern on prior exam  ?probably represents a  combination of scarring and adjacent cyst but  ?is difficult to characterize. No change from previous, 1.2 cm in  ?transverse dimension. Surveillance suggested.  ?3. Wall thickening in the descending and sigmoid colon is  ?nonspecific and could be from colitis/inflammation or nondistention.  ?Correlate with patient's symptoms.  ?4. Chronic low-grade edema along the mesentery and root of the  ?mesentery, nonspecific.  ?5. Other imaging findings of potential clinical significance: Aortic  ?Atherosclerosis (ICD10-I70.0). Coronary atherosclerosis. Mild  ?cardiomegaly. Posterior calcified subserosal fibroid along the  ?uterine fundus. Chronic right pars defect at L5.  ?Lower chest: No acute abnormality identified within the imaged ?portions of the lung bases. ?Hepatobiliary: No focal liver abnormality is seen. Status post ?cholecystectomy.Marland Kitchen ?Pancreas: Postoperative changes from Whipple procedure. Residual ?pancreas is atrophic with similar appearance of main duct ?dilatation. ?Spleen: Normal in size without focal abnormality. ?Adrenals/Urinary Tract: Upper pole right kidney lesion measures 1.1 ?cm and 64 Hounsfield units, image 25/2. Unchanged scratch set ?previously this measured 1.3 cm and 65 Hounsfield units. The urinary ?bladder is unremarkable. ?Stomach/Bowel: Distal gastrectomy with gastrojejunostomy is again ?noted. Roux limb appears nondilated. Retained stent is again noted ?  in the pancreaticobiliary limb, image 26/2. No bowel wall ?thickening, inflammation, or distension. Decreased stool burden ?compared with the previous exam. ?Vascular/Lymphatic: Aortic atherosclerosis. No aneurysm. No ?abdominopelvic adenopathy. ?Reproductive: Unremarkable. ?Other: No ascites or focal fluid collections. No peritoneal nodule ?or mass. ?Musculoskeletal: No acute or significant osseous findings. Chronic ?right-sided L5 pars defect noted. ? ?IMPRESSION: ?1. No acute findings identified within the abdomen or pelvis. ?2. Status  post Whipple procedure. No specific findings identified to ?suggest residual or recurrence of tumor or metastatic disease ?3. Retained stent is again noted in the pancreatico biliary limb. ?4. Slight decrease

## 2021-09-16 ENCOUNTER — Telehealth: Payer: Self-pay | Admitting: Oncology

## 2021-09-16 ENCOUNTER — Other Ambulatory Visit: Payer: Self-pay | Admitting: Oncology

## 2021-09-16 ENCOUNTER — Other Ambulatory Visit: Payer: Medicare Other

## 2021-09-16 ENCOUNTER — Inpatient Hospital Stay: Payer: Medicare Other | Attending: Oncology | Admitting: Oncology

## 2021-09-16 ENCOUNTER — Encounter: Payer: Self-pay | Admitting: Oncology

## 2021-09-16 VITALS — BP 182/77 | HR 67 | Temp 98.1°F | Resp 14 | Ht 63.0 in | Wt 107.7 lb

## 2021-09-16 DIAGNOSIS — C3411 Malignant neoplasm of upper lobe, right bronchus or lung: Secondary | ICD-10-CM | POA: Diagnosis not present

## 2021-09-16 DIAGNOSIS — D508 Other iron deficiency anemias: Secondary | ICD-10-CM

## 2021-09-16 DIAGNOSIS — C259 Malignant neoplasm of pancreas, unspecified: Secondary | ICD-10-CM | POA: Diagnosis not present

## 2021-09-16 DIAGNOSIS — Z95828 Presence of other vascular implants and grafts: Secondary | ICD-10-CM

## 2021-09-16 NOTE — Telephone Encounter (Signed)
Per 09/16/21 los next appt scheduled and confirmed with patient ?

## 2021-10-07 ENCOUNTER — Other Ambulatory Visit: Payer: Self-pay | Admitting: Cardiology

## 2021-10-12 ENCOUNTER — Other Ambulatory Visit: Payer: Self-pay | Admitting: Gastroenterology

## 2021-10-17 ENCOUNTER — Other Ambulatory Visit: Payer: Self-pay | Admitting: Cardiology

## 2021-11-14 ENCOUNTER — Other Ambulatory Visit: Payer: Self-pay | Admitting: Cardiology

## 2021-12-11 ENCOUNTER — Other Ambulatory Visit: Payer: Self-pay | Admitting: Cardiology

## 2021-12-23 ENCOUNTER — Telehealth: Payer: Self-pay | Admitting: Allergy and Immunology

## 2021-12-23 NOTE — Telephone Encounter (Signed)
Please advise 

## 2021-12-23 NOTE — Telephone Encounter (Signed)
Patient informed. 

## 2021-12-23 NOTE — Telephone Encounter (Signed)
Patient states she is moving to [place], [place] on 12/31/21 and is wanting to know if Dr. Neldon Mc recommended any allergist that would be a little closer to her.

## 2021-12-23 NOTE — Telephone Encounter (Signed)
Left message for patient to call.  Please let her know that Dr. Neldon Mc recommends [facility] in [place], either Dr. Pablo Lawrence or Dr. Oris Drone.

## 2021-12-25 ENCOUNTER — Other Ambulatory Visit: Payer: Self-pay | Admitting: Cardiology

## 2021-12-28 ENCOUNTER — Encounter: Payer: Self-pay | Admitting: Oncology

## 2021-12-28 DIAGNOSIS — Z95828 Presence of other vascular implants and grafts: Secondary | ICD-10-CM | POA: Insufficient documentation

## 2021-12-29 ENCOUNTER — Inpatient Hospital Stay: Payer: Medicare Other | Attending: Oncology

## 2021-12-29 VITALS — BP 147/64 | HR 58 | Temp 97.9°F | Resp 18 | Ht 63.0 in | Wt 99.2 lb

## 2021-12-29 DIAGNOSIS — C259 Malignant neoplasm of pancreas, unspecified: Secondary | ICD-10-CM | POA: Insufficient documentation

## 2021-12-29 DIAGNOSIS — Z452 Encounter for adjustment and management of vascular access device: Secondary | ICD-10-CM | POA: Insufficient documentation

## 2021-12-29 DIAGNOSIS — Z95828 Presence of other vascular implants and grafts: Secondary | ICD-10-CM

## 2021-12-29 MED ORDER — HEPARIN SOD (PORK) LOCK FLUSH 100 UNIT/ML IV SOLN
500.0000 [IU] | Freq: Once | INTRAVENOUS | Status: AC | PRN
  Administered 2021-12-29: 500 [IU]

## 2021-12-29 MED ORDER — SODIUM CHLORIDE 0.9% FLUSH
10.0000 mL | INTRAVENOUS | Status: DC | PRN
  Administered 2021-12-29: 10 mL

## 2021-12-30 ENCOUNTER — Other Ambulatory Visit: Payer: Self-pay | Admitting: Gastroenterology

## 2022-01-05 ENCOUNTER — Other Ambulatory Visit: Payer: Self-pay | Admitting: Cardiology

## 2022-01-10 ENCOUNTER — Other Ambulatory Visit: Payer: Self-pay | Admitting: Gastroenterology

## 2022-02-23 ENCOUNTER — Telehealth: Payer: Self-pay | Admitting: Oncology

## 2022-02-23 NOTE — Telephone Encounter (Signed)
Contacted pt to notify her of upcoming scan appt. But, she notified me that she no longer lives in the area and wishes all appts be cancelled.

## 2022-02-24 ENCOUNTER — Ambulatory Visit: Payer: Medicare Other | Admitting: Allergy and Immunology

## 2022-03-14 ENCOUNTER — Other Ambulatory Visit: Payer: Self-pay | Admitting: Allergy and Immunology

## 2022-03-15 ENCOUNTER — Other Ambulatory Visit: Payer: Self-pay | Admitting: Allergy and Immunology

## 2022-03-18 ENCOUNTER — Ambulatory Visit: Payer: Medicare Other | Admitting: Oncology

## 2022-05-18 ENCOUNTER — Other Ambulatory Visit: Payer: Self-pay

## 2022-09-25 ENCOUNTER — Other Ambulatory Visit: Payer: Self-pay | Admitting: Gastroenterology

## 2023-02-14 ENCOUNTER — Other Ambulatory Visit: Payer: Self-pay

## 2023-02-14 MED ORDER — PANTOPRAZOLE SODIUM 40 MG PO TBEC: 40.0000 mg | DELAYED_RELEASE_TABLET | Freq: Every day | ORAL | 0 refills | Status: DC

## 2023-05-13 ENCOUNTER — Other Ambulatory Visit: Payer: Self-pay | Admitting: Gastroenterology

## 2023-07-22 DEATH — deceased
